# Patient Record
Sex: Female | Born: 1950 | Race: Black or African American | Hispanic: No | Marital: Married | Smoking: Former smoker
Health system: Southern US, Community
[De-identification: ages and names within clinical notes are randomized; demographics above are authoritative.]

## PROBLEM LIST (undated history)

## (undated) DIAGNOSIS — K219 Gastro-esophageal reflux disease without esophagitis: Secondary | ICD-10-CM

## (undated) DIAGNOSIS — I10 Essential (primary) hypertension: Secondary | ICD-10-CM

## (undated) DIAGNOSIS — IMO0002 Reserved for concepts with insufficient information to code with codable children: Secondary | ICD-10-CM

## (undated) DIAGNOSIS — E785 Hyperlipidemia, unspecified: Secondary | ICD-10-CM

## (undated) DIAGNOSIS — I2699 Other pulmonary embolism without acute cor pulmonale: Secondary | ICD-10-CM

## (undated) DIAGNOSIS — I82409 Acute embolism and thrombosis of unspecified deep veins of unspecified lower extremity: Secondary | ICD-10-CM

## (undated) DIAGNOSIS — M1990 Unspecified osteoarthritis, unspecified site: Secondary | ICD-10-CM

## (undated) DIAGNOSIS — D689 Coagulation defect, unspecified: Secondary | ICD-10-CM

## (undated) HISTORY — PX: CYSTECTOMY: SUR359

## (undated) HISTORY — DX: Gastro-esophageal reflux disease without esophagitis: K21.9

## (undated) HISTORY — PX: TUBAL LIGATION: SHX77

## (undated) HISTORY — DX: Coagulation defect, unspecified: D68.9

## (undated) HISTORY — DX: Hyperlipidemia, unspecified: E78.5

## (undated) HISTORY — PX: SKIN GRAFT: SHX250

## (undated) HISTORY — DX: Other pulmonary embolism without acute cor pulmonale: I26.99

## (undated) HISTORY — DX: Reserved for concepts with insufficient information to code with codable children: IMO0002

## (undated) HISTORY — DX: Acute embolism and thrombosis of unspecified deep veins of unspecified lower extremity: I82.409

## (undated) HISTORY — PX: KNEE ARTHROSCOPY: SUR90

---

## 1989-04-15 DIAGNOSIS — I82409 Acute embolism and thrombosis of unspecified deep veins of unspecified lower extremity: Secondary | ICD-10-CM

## 1989-04-15 DIAGNOSIS — I2699 Other pulmonary embolism without acute cor pulmonale: Secondary | ICD-10-CM

## 1989-04-15 HISTORY — DX: Acute embolism and thrombosis of unspecified deep veins of unspecified lower extremity: I82.409

## 1989-04-15 HISTORY — DX: Other pulmonary embolism without acute cor pulmonale: I26.99

## 1999-05-11 LAB — PROTIME-INR

## 2010-04-07 ENCOUNTER — Encounter: Payer: Self-pay | Admitting: Cardiology

## 2010-05-12 ENCOUNTER — Ambulatory Visit: Payer: Self-pay | Admitting: Cardiology

## 2010-05-12 DIAGNOSIS — E785 Hyperlipidemia, unspecified: Secondary | ICD-10-CM

## 2010-05-12 DIAGNOSIS — Z6835 Body mass index (BMI) 35.0-35.9, adult: Secondary | ICD-10-CM

## 2010-05-12 DIAGNOSIS — I10 Essential (primary) hypertension: Secondary | ICD-10-CM | POA: Insufficient documentation

## 2010-05-12 DIAGNOSIS — R9431 Abnormal electrocardiogram [ECG] [EKG]: Secondary | ICD-10-CM

## 2010-05-17 ENCOUNTER — Telehealth: Payer: Self-pay | Admitting: Cardiology

## 2010-09-14 NOTE — Assessment & Plan Note (Signed)
Summary: Christina [name]   Visit Type:  Initial Consult Primary Provider:  Paulene Floor, NP  CC:  Abn ETT.  History of Present Illness: The patient presents for evaluation of an abnormal treadmill test. She has no prior cardiac history. However, for the last several months she noticed symptoms such as dyspnea climbing a flight of stairs or increased shortness of breath with a row begin activity. She was having occasional rapid heart rates. She is not describing irregular heart rates and was not having any presyncope or syncope. She was not having any resting shortness of breath and wasn't describing PND or orthopnea. She did not describe chest pressure, neck or arm discomfort. She has been treated recently for hypertension which has been somewhat difficult to control.  She has also had significant dyslipidemia with an LDL of 181.  She did wear a monitor for 3 weeks which demonstrated no arrhythmias. She had an exercise treadmill test. She was able to exercise for 7 minutes and 27 seconds. There was an accelerated blood pressure response with a peak of 228/100. She did get some chest tightness and there was some anterolateral ST depression at peak exercise of about 2 mm. She is referred for further evaluation. She does say that she otherwise does not get this discomfort with activities. She doesn't describe jaw or arm discomfort.  Current Medications (verified): 1)  Lipitor 20 Mg Tabs (Atorvastatin Calcium) .Marland Kitchen.. 1 By Mouth Daily 2)  Diovan 320 Mg Tabs (Valsartan) .Marland Kitchen.. 1 By Mouth Daily 3)  Coumadin 10 Mg Tabs (Warfarin Sodium) .... As Directed 4)  Aspirin 81 Mg  Tabs (Aspirin) .Marland Kitchen.. 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 No clotting disorder  Social History: Married Five children Quit tobacco x 33 years  Review of Systems       As stated in the HPI and negative for all other systems.   Vital Signs:  Patient profile:   60 year old female Height:      69 inches Weight:      232 pounds BMI:     34.38 Pulse rate:   86 / minute Resp:     18 per minute BP sitting:   146 / 92  (right arm)  Vitals Entered By: Marrion Coy, CNA (May 12, 2010 2:38 PM)  Physical Exam  General:  Well developed, well nourished, in no acute distress. Head:  normocephalic and atraumatic Eyes:  PERRLA/EOM intact; conjunctiva and lids normal. Mouth:  Teeth, gums and palate normal. Oral mucosa normal. Neck:  Neck supple, no JVD. No masses, thyromegaly or abnormal cervical nodes. Chest Wall:  no deformities or breast masses noted Lungs:  Clear bilaterally to auscultation and percussion. Abdomen:  Bowel sounds positive; abdomen soft and non-tender without masses, organomegaly, or hernias noted. No hepatosplenomegaly, obese Msk:  Back normal, normal gait. Muscle strength and tone normal. Extremities:  No clubbing or cyanosis. Neurologic:  Alert and oriented x 3. Skin:  Intact without lesions or rashes. Cervical Nodes:  no significant adenopathy Axillary Nodes:  no significant adenopathy Psych:  Normal affect.   Detailed Cardiovascular Exam  Neck    Carotids: Carotids full and equal bilaterally without bruits.      Neck Veins: Normal, no JVD.  Heart    Inspection: no deformities or lifts noted.      Palpation: normal PMI with no thrills palpable.      Auscultation: regular rate and rhythm, S1, S2 without murmurs, rubs, gallops, or clicks.    Vascular    Abdominal Aorta: no palpable masses, pulsations, or audible bruits.      Femoral Pulses: normal femoral pulses bilaterally.      Pedal Pulses: normal pedal pulses bilaterally.      Radial Pulses: normal radial pulses bilaterally.       Peripheral Circulation: no clubbing, cyanosis, or edema noted with normal capillary refill.     EKG  Procedure date:  04/07/2010  Findings:      sinus rhythm, rate 78, axis within normal limits, intervals within normal limits, no acute ST-T wave changes  Impression & Recommendations:  Problem # 1:  ABNORMAL STRESS ELECTROCARDIOGRAM (ICD-794.31) The patient does have an abnormal treadmill test as described with some symptoms. The ST changes may have been related to the hypertensive blood pressure response. I don't think catheterization is indicated but I wouldn't pursue stress perfusion imaging to further clarify. She needs primary risk reduction as well. Orders: Nuclear Stress Test (Nuc Stress Test)  Problem # 2:  ESSENTIAL HYPERTENSION, BENIGN (ICD-401.1) She has just been started on the current dose of medication and may need further additions. I've asked her to buy a blood pressure cuff and instructed her on the diary. She needs therapeutic lifestyle changes to include salt restriction which she doesn't do and weight loss.  Problem # 3:  DYSLIPIDEMIA (ICD-272.4) She was recently started on Lipitor. I will defer to her primary physician. She understands the need for dietary control.  Problem # 4:  OVERWEIGHT (ICD-278.02) We discussed the need for weight loss with diet and exercise.  Patient Instructions: 1)  Your physician recommends that you schedule a follow-up appointment in: 6 months with Dr Antoine Poche 2)  Your physician recommends that you continue on your current medications as directed. Please refer to the Current Medication list given to you today. 3)  Your physician has requested that you have an exercise stress myoview.  For further information please visit [email or web address]  Please follow instruction sheet, as given.

## 2010-09-14 NOTE — Progress Notes (Signed)
Summary: pt didn't receive any insurance  Phone Note Call from Patient Call back at [facility] Phone [phone]   Caller: Patient Reason for Call: Talk to Nurse Summary of Call: per pt calling, pt wanted pam to know she didn't receive any insurance.  Initial call taken by: Lorne Skeens,  May 17, 2010 11:40 AM  Follow-up for Phone Call        Wife had applied for [employer] and was denied.  She said she will apply online and the earliest it would be approved would be sometime in November.  She will call Pam back when she is approved  in order to schedule her stress test.  I will forward this to Pam. Mylo Red RN

## 2010-12-30 ENCOUNTER — Encounter: Payer: Self-pay | Admitting: Family Medicine

## 2011-03-22 ENCOUNTER — Encounter: Payer: Self-pay | Admitting: Cardiology

## 2012-07-04 ENCOUNTER — Ambulatory Visit: Payer: Self-pay | Admitting: Physician Assistant

## 2012-11-01 ENCOUNTER — Ambulatory Visit (INDEPENDENT_AMBULATORY_CARE_PROVIDER_SITE_OTHER): Payer: BC Managed Care – PPO | Admitting: Pharmacist

## 2012-11-01 ENCOUNTER — Other Ambulatory Visit: Payer: Self-pay | Admitting: Pharmacist

## 2012-11-01 VITALS — BP 110/70 | HR 72

## 2012-11-01 DIAGNOSIS — I82409 Acute embolism and thrombosis of unspecified deep veins of unspecified lower extremity: Secondary | ICD-10-CM

## 2012-11-01 DIAGNOSIS — Z86718 Personal history of other venous thrombosis and embolism: Secondary | ICD-10-CM | POA: Insufficient documentation

## 2012-11-01 LAB — POCT INR: INR: 1.9

## 2012-11-12 ENCOUNTER — Other Ambulatory Visit: Payer: Self-pay | Admitting: *Deleted

## 2012-11-12 MED ORDER — ATORVASTATIN CALCIUM 20 MG PO TABS: ORAL_TABLET | ORAL | Status: DC

## 2012-11-12 NOTE — Telephone Encounter (Signed)
Pt aware rx for lipitor  Ready for pt here.

## 2012-11-14 ENCOUNTER — Other Ambulatory Visit: Payer: Self-pay | Admitting: *Deleted

## 2012-11-14 MED ORDER — ATORVASTATIN CALCIUM 20 MG PO TABS: ORAL_TABLET | ORAL | Status: DC

## 2012-11-20 ENCOUNTER — Telehealth: Payer: Self-pay | Admitting: Nurse Practitioner

## 2012-11-21 NOTE — Telephone Encounter (Signed)
Health nurse is assigned to patient through insurance.  Just wanted let me know.

## 2012-11-26 ENCOUNTER — Telehealth: Payer: Self-pay | Admitting: Nurse Practitioner

## 2012-11-26 NOTE — Telephone Encounter (Signed)
I will have to see paper. Did we do DMV form on her?

## 2012-11-26 NOTE — Telephone Encounter (Signed)
Has restrictions on her license now and she wants to know what is going on she went to the [facility] they told her to come here. She is very upset afraid she will lose her license and then her job please advise

## 2012-11-27 NOTE — Telephone Encounter (Signed)
Patient aware.

## 2012-11-29 ENCOUNTER — Telehealth: Payer: Self-pay | Admitting: Nurse Practitioner

## 2012-11-29 NOTE — Telephone Encounter (Signed)
MMM HAS- WILL READ THEN WE CAN CALL PT BACK

## 2012-12-03 ENCOUNTER — Encounter: Payer: Self-pay | Admitting: Nurse Practitioner

## 2012-12-03 NOTE — Telephone Encounter (Signed)
Let patient know letter is ready for pick up

## 2012-12-03 NOTE — Telephone Encounter (Signed)
NEEDS A LETTER WROTE TO THE MEDICAL BOARD ABOUT HER BLOOD PRESSURE FOR THE DMV

## 2012-12-04 ENCOUNTER — Telehealth: Payer: Self-pay | Admitting: Nurse Practitioner

## 2012-12-04 NOTE — Telephone Encounter (Signed)
Pt aware.

## 2012-12-04 NOTE — Telephone Encounter (Signed)
Pt aware needs a list from paper chart she hasn't been seen in [facility] link.

## 2012-12-13 ENCOUNTER — Encounter: Payer: Self-pay | Admitting: Pharmacist

## 2012-12-13 ENCOUNTER — Ambulatory Visit (INDEPENDENT_AMBULATORY_CARE_PROVIDER_SITE_OTHER): Payer: BC Managed Care – PPO | Admitting: Pharmacist

## 2012-12-13 VITALS — BP 122/74 | HR 78

## 2012-12-13 DIAGNOSIS — I82409 Acute embolism and thrombosis of unspecified deep veins of unspecified lower extremity: Secondary | ICD-10-CM

## 2012-12-13 LAB — POCT INR: INR: 2.2

## 2012-12-13 NOTE — Patient Instructions (Signed)
Anticoagulation Dose Instructions as of 12/13/2012     Glynis Smiles Tue Wed Thu Fri Sat   New Dose 10 mg 5 mg 10 mg 5 mg 10 mg 5 mg 10 mg    Description       Continue same dose.  Recheck 4-6 weeks.      INR was 2.2 today

## 2013-01-26 ENCOUNTER — Other Ambulatory Visit: Payer: Self-pay | Admitting: Nurse Practitioner

## 2013-02-01 ENCOUNTER — Ambulatory Visit (INDEPENDENT_AMBULATORY_CARE_PROVIDER_SITE_OTHER): Payer: BC Managed Care – PPO | Admitting: Nurse Practitioner

## 2013-02-01 ENCOUNTER — Encounter: Payer: Self-pay | Admitting: Nurse Practitioner

## 2013-02-01 VITALS — BP 140/81 | HR 79 | Temp 98.3°F | Ht 68.0 in | Wt 224.0 lb

## 2013-02-01 DIAGNOSIS — I82409 Acute embolism and thrombosis of unspecified deep veins of unspecified lower extremity: Secondary | ICD-10-CM

## 2013-02-01 DIAGNOSIS — I82403 Acute embolism and thrombosis of unspecified deep veins of lower extremity, bilateral: Secondary | ICD-10-CM

## 2013-02-01 DIAGNOSIS — E785 Hyperlipidemia, unspecified: Secondary | ICD-10-CM

## 2013-02-01 DIAGNOSIS — I10 Essential (primary) hypertension: Secondary | ICD-10-CM

## 2013-02-01 LAB — COMPLETE METABOLIC PANEL WITH GFR
CO2: 26 mEq/L (ref 19–32)
Calcium: 9.8 mg/dL (ref 8.4–10.5)
Creat: 1.09 mg/dL (ref 0.50–1.10)
GFR, Est African American: 62 mL/min
GFR, Est Non African American: 54 mL/min — ABNORMAL LOW
Glucose, Bld: 79 mg/dL (ref 70–99)
Total Bilirubin: 0.3 mg/dL (ref 0.3–1.2)

## 2013-02-01 MED ORDER — ATORVASTATIN CALCIUM 20 MG PO TABS: 20.0000 mg | ORAL_TABLET | Freq: Every day | ORAL | Status: DC

## 2013-02-01 MED ORDER — WARFARIN SODIUM 10 MG PO TABS: 10.0000 mg | ORAL_TABLET | Freq: Every day | ORAL | Status: DC

## 2013-02-01 MED ORDER — OLMESARTAN-AMLODIPINE-HCTZ 40-5-12.5 MG PO TABS: 1.0000 | ORAL_TABLET | Freq: Every day | ORAL | Status: DC

## 2013-02-01 NOTE — Patient Instructions (Signed)

## 2013-02-01 NOTE — Progress Notes (Signed)
  Subjective:    Patient ID: Christina [name], female    DOB: Feb 08, 1951, 62 y.o.   MRN: [id number]  Hypertension This is a chronic problem. The current episode started more than 1 year ago. The problem is controlled. Pertinent negatives include no blurred vision, headaches, malaise/fatigue, peripheral edema or shortness of breath. There are no associated agents to hypertension. Risk factors for coronary artery disease include dyslipidemia, obesity and post-menopausal state. Past treatments include angiotensin blockers, calcium channel blockers and diuretics. The current treatment provides moderate improvement. Compliance problems include diet and exercise.   Hyperlipidemia This is a chronic problem. The current episode started more than 1 year ago. The problem is controlled. Recent lipid tests were reviewed and are normal. There are no known factors aggravating her hyperlipidemia. Pertinent negatives include no focal sensory loss, focal weakness, leg pain, myalgias or shortness of breath. The current treatment provides moderate improvement of lipids. Compliance problems include adherence to diet and adherence to exercise.   Hx DVT Currently on coumadin therapy- doing well- no bleeding   Review of Systems  Constitutional: Negative for malaise/fatigue.  Eyes: Negative for blurred vision.  Respiratory: Negative for shortness of breath.   Musculoskeletal: Negative for myalgias.  Neurological: Negative for focal weakness and headaches.  All other systems reviewed and are negative.       Objective:   Physical Exam  Constitutional: She is oriented to person, place, and time. She appears well-developed and well-nourished.  HENT:  Nose: Nose normal.  Mouth/Throat: Oropharynx is clear and moist.  Eyes: EOM are normal.  Neck: Trachea normal, normal range of motion and full passive range of motion without pain. Neck supple. No JVD present. Carotid bruit is not present. No thyromegaly present.   Cardiovascular: Normal rate, regular rhythm, normal heart sounds and intact distal pulses.  Exam reveals no gallop and no friction rub.   No murmur heard. Pulmonary/Chest: Effort normal and breath sounds normal.  Abdominal: Soft. Bowel sounds are normal. She exhibits no distension and no mass. There is no tenderness.  Musculoskeletal: Normal range of motion.  Lymphadenopathy:    She has no cervical adenopathy.  Neurological: She is alert and oriented to person, place, and time. She has normal reflexes.  Skin: Skin is warm and dry.  Psychiatric: She has a normal mood and affect. Her behavior is normal. Judgment and thought content normal.    BP 140/81  Pulse 79  Temp(Src) 98.3 F (36.8 C) (Oral)  Ht 5\' 8"  (1.727 m)  Wt 224 lb (101.606 kg)  BMI 34.07 kg/m2       Assessment & Plan:  1. Hypertension Low Na +diet - atorvastatin (LIPITOR) 20 MG tablet; Take 1 tablet (20 mg total) by mouth daily.  Dispense: 30 tablet; Refill: 5  2. Hyperlipidemia Low fat diet and exercise - Olmesartan-Amlodipine-HCTZ (TRIBENZOR) 40-5-12.5 MG TABS; Take 1 tablet by mouth daily.  Dispense: 30 tablet; Refill: 5  3. DVT (deep venous thrombosis), bilateral Watch for bleeding - warfarin (COUMADIN) 10 MG tablet; Take 1 tablet (10 mg total) by mouth daily. Use daily as directed  Dispense: 45 tablet; Refill: 5  Mary-Margaret Daphine Deutscher, FNP

## 2013-02-04 ENCOUNTER — Other Ambulatory Visit: Payer: Self-pay | Admitting: Nurse Practitioner

## 2013-02-04 LAB — NMR LIPOPROFILE WITH LIPIDS
HDL Size: 9.2 nm (ref >=9.2)
HDL-C: 54 mg/dL (ref >=40)
LDL (calc): 158 mg/dL — ABNORMAL HIGH (ref <100)
LDL Size: 20.8 nm (ref >20.5)
LP-IR Score: 40 (ref <=45)
Large HDL-P: 8 umol/L (ref >=4.8)
Small LDL Particle Number: 1361 nmol/L — ABNORMAL HIGH (ref <=527)

## 2013-02-04 MED ORDER — ATORVASTATIN CALCIUM 40 MG PO TABS: 40.0000 mg | ORAL_TABLET | Freq: Every day | ORAL | Status: DC

## 2013-02-05 ENCOUNTER — Telehealth: Payer: Self-pay | Admitting: Nurse Practitioner

## 2013-02-05 NOTE — Telephone Encounter (Signed)
Pt aware ,new rx for cholesterol.

## 2013-02-14 ENCOUNTER — Encounter: Payer: Self-pay | Admitting: Pharmacist

## 2013-02-14 ENCOUNTER — Ambulatory Visit (INDEPENDENT_AMBULATORY_CARE_PROVIDER_SITE_OTHER): Payer: BC Managed Care – PPO | Admitting: Pharmacist

## 2013-02-14 VITALS — BP 116/78 | HR 77

## 2013-02-14 DIAGNOSIS — I82409 Acute embolism and thrombosis of unspecified deep veins of unspecified lower extremity: Secondary | ICD-10-CM

## 2013-02-14 LAB — POCT INR: INR: 2.1

## 2013-02-14 MED ORDER — DICLOFENAC SODIUM 1 % TD GEL: TRANSDERMAL | Status: DC

## 2013-02-14 NOTE — Patient Instructions (Signed)
Anticoagulation Dose Instructions as of 02/14/2013     Christina [name] Tue Wed Thu Fri Sat   New Dose 10 mg 5 mg 10 mg 5 mg 10 mg 5 mg 10 mg    Description       Continue same dose.  Recheck 4-6 weeks.      INR was 2.1 today

## 2013-04-01 ENCOUNTER — Ambulatory Visit (INDEPENDENT_AMBULATORY_CARE_PROVIDER_SITE_OTHER): Payer: BC Managed Care – PPO | Admitting: Pharmacist

## 2013-04-01 VITALS — BP 126/70

## 2013-04-01 DIAGNOSIS — I82409 Acute embolism and thrombosis of unspecified deep veins of unspecified lower extremity: Secondary | ICD-10-CM

## 2013-04-01 NOTE — Patient Instructions (Addendum)
Anticoagulation Dose Instructions as of 04/01/2013     Christina [name] Tue Wed Thu Fri Sat   New Dose 10 mg 5 mg 10 mg 5 mg 10 mg 5 mg 10 mg    Description       Take 1 tablet instead of 1/2 tablet today, then restart same dose.  Recheck 4-6 weeks.       INR was 1.8 today

## 2013-04-10 ENCOUNTER — Encounter: Payer: Self-pay | Admitting: *Deleted

## 2013-05-16 ENCOUNTER — Ambulatory Visit (INDEPENDENT_AMBULATORY_CARE_PROVIDER_SITE_OTHER): Payer: BC Managed Care – PPO | Admitting: Pharmacist

## 2013-05-16 VITALS — BP 114/78 | HR 77

## 2013-05-16 DIAGNOSIS — Z23 Encounter for immunization: Secondary | ICD-10-CM

## 2013-05-16 DIAGNOSIS — I82409 Acute embolism and thrombosis of unspecified deep veins of unspecified lower extremity: Secondary | ICD-10-CM

## 2013-05-16 NOTE — Patient Instructions (Signed)
Anticoagulation Dose Instructions as of 05/16/2013     Christina [name] Tue Wed Thu Fri Sat   New Dose 10 mg 5 mg 10 mg 5 mg 10 mg 5 mg 10 mg    Description       Continue 1/2 tablet Monday, wednesdays and fridays and 1 tablet all other days.  Recheck 4-6 weeks.       INR was 2.2 today.

## 2013-05-27 ENCOUNTER — Ambulatory Visit: Payer: Self-pay | Admitting: Nurse Practitioner

## 2013-05-29 ENCOUNTER — Ambulatory Visit: Payer: Self-pay | Admitting: Nurse Practitioner

## 2013-06-18 ENCOUNTER — Encounter: Payer: Self-pay | Admitting: Nurse Practitioner

## 2013-06-18 ENCOUNTER — Ambulatory Visit (INDEPENDENT_AMBULATORY_CARE_PROVIDER_SITE_OTHER): Payer: BC Managed Care – PPO | Admitting: Nurse Practitioner

## 2013-06-18 VITALS — BP 133/77 | HR 78 | Temp 98.7°F | Ht 68.0 in | Wt 224.0 lb

## 2013-06-18 DIAGNOSIS — I82403 Acute embolism and thrombosis of unspecified deep veins of lower extremity, bilateral: Secondary | ICD-10-CM

## 2013-06-18 DIAGNOSIS — E785 Hyperlipidemia, unspecified: Secondary | ICD-10-CM

## 2013-06-18 DIAGNOSIS — I10 Essential (primary) hypertension: Secondary | ICD-10-CM

## 2013-06-18 DIAGNOSIS — I82409 Acute embolism and thrombosis of unspecified deep veins of unspecified lower extremity: Secondary | ICD-10-CM

## 2013-06-18 DIAGNOSIS — E559 Vitamin D deficiency, unspecified: Secondary | ICD-10-CM

## 2013-06-18 LAB — POCT INR: INR: 2

## 2013-06-18 MED ORDER — OLMESARTAN-AMLODIPINE-HCTZ 40-5-12.5 MG PO TABS: 1.0000 | ORAL_TABLET | Freq: Every day | ORAL | Status: DC

## 2013-06-18 MED ORDER — WARFARIN SODIUM 10 MG PO TABS: 10.0000 mg | ORAL_TABLET | Freq: Every day | ORAL | Status: DC

## 2013-06-18 MED ORDER — ATORVASTATIN CALCIUM 40 MG PO TABS: 40.0000 mg | ORAL_TABLET | Freq: Every day | ORAL | Status: DC

## 2013-06-18 NOTE — Patient Instructions (Addendum)

## 2013-06-18 NOTE — Progress Notes (Signed)
  Subjective:    Patient ID: Christina [name], female    DOB: 03-25-1951, 62 y.o.   MRN: [id number]  Hypertension This is a chronic problem. The current episode started more than 1 year ago. The problem is controlled. Pertinent negatives include no blurred vision, headaches, malaise/fatigue, peripheral edema or shortness of breath. There are no associated agents to hypertension. Risk factors for coronary artery disease include dyslipidemia, obesity and post-menopausal state. Past treatments include angiotensin blockers, calcium channel blockers and diuretics. The current treatment provides moderate improvement. Compliance problems include diet and exercise.   Hyperlipidemia This is a chronic problem. The current episode started more than 1 year ago. The problem is controlled. Recent lipid tests were reviewed and are normal. There are no known factors aggravating her hyperlipidemia. Pertinent negatives include no focal sensory loss, focal weakness, leg pain, myalgias or shortness of breath. The current treatment provides moderate improvement of lipids. Compliance problems include adherence to diet and adherence to exercise.   Hx DVT Currently on coumadin therapy- doing well- no bleeding   Review of Systems  Constitutional: Negative for malaise/fatigue.  Eyes: Negative for blurred vision.  Respiratory: Negative for shortness of breath.   Musculoskeletal: Negative for myalgias.  Neurological: Negative for focal weakness and headaches.  All other systems reviewed and are negative.       Objective:   Physical Exam  Constitutional: She is oriented to person, place, and time. She appears well-developed and well-nourished.  HENT:  Nose: Nose normal.  Mouth/Throat: Oropharynx is clear and moist.  Eyes: EOM are normal.  Neck: Trachea normal, normal range of motion and full passive range of motion without pain. Neck supple. No JVD present. Carotid bruit is not present. No thyromegaly present.   Cardiovascular: Normal rate, regular rhythm, normal heart sounds and intact distal pulses.  Exam reveals no gallop and no friction rub.   No murmur heard. Pulmonary/Chest: Effort normal and breath sounds normal.  Abdominal: Soft. Bowel sounds are normal. She exhibits no distension and no mass. There is no tenderness.  Musculoskeletal: Normal range of motion.  Lymphadenopathy:    She has no cervical adenopathy.  Neurological: She is alert and oriented to person, place, and time. She has normal reflexes.  Skin: Skin is warm and dry.  Psychiatric: She has a normal mood and affect. Her behavior is normal. Judgment and thought content normal.    BP 133/77  Pulse 78  Temp(Src) 98.7 F (37.1 C) (Oral)  Ht 5\' 8"  (1.727 m)  Wt 224 lb (101.606 kg)  BMI 34.07 kg/m2       Assessment & Plan:  1. Hypertension Low Na +diet - atorvastatin (LIPITOR) 20 MG tablet; Take 1 tablet (20 mg total) by mouth daily.  Dispense: 30 tablet; Refill: 5  2. Hyperlipidemia Low fat diet and exercise - Olmesartan-Amlodipine-HCTZ (TRIBENZOR) 40-5-12.5 MG TABS; Take 1 tablet by mouth daily.  Dispense: 30 tablet; Refill: 5  3. DVT (deep venous thrombosis), bilateral Watch for bleeding - warfarin (COUMADIN) 10 MG tablet; Take 1 tablet (10 mg total) by mouth daily. Use daily as directed  Dispense: 45 tablet; Refill: 5  Mary-Margaret Daphine Deutscher, FNP

## 2013-06-20 LAB — CMP14+EGFR
Albumin/Globulin Ratio: 1.6 (ref 1.1–2.5)
Albumin: 4.4 g/dL (ref 3.6–4.8)
Alkaline Phosphatase: 98 IU/L (ref 39–117)
BUN/Creatinine Ratio: 17 (ref 11–26)
BUN: 17 mg/dL (ref 8–27)
Chloride: 102 mmol/L (ref 97–108)
GFR calc Af Amer: 71 mL/min/{1.73_m2} (ref >59)
GFR calc non Af Amer: 61 mL/min/{1.73_m2} (ref >59)
Globulin, Total: 2.7 g/dL (ref 1.5–4.5)
Total Bilirubin: 0.4 mg/dL (ref 0.0–1.2)
Total Protein: 7.1 g/dL (ref 6.0–8.5)

## 2013-06-20 LAB — NMR, LIPOPROFILE
Cholesterol: 175 mg/dL (ref <200)
HDL Particle Number: 34.4 umol/L (ref >=30.5)
LDL Particle Number: 1388 nmol/L — ABNORMAL HIGH (ref <1000)
LDL Size: 21 nm (ref >20.5)
LP-IR Score: 29 (ref <=45)
Small LDL Particle Number: 455 nmol/L (ref <=527)
Triglycerides by NMR: 119 mg/dL (ref <150)

## 2013-06-20 LAB — VITAMIN D 25 HYDROXY (VIT D DEFICIENCY, FRACTURES): Vit D, 25-Hydroxy: 37.3 ng/mL (ref 30.0–100.0)

## 2013-07-08 ENCOUNTER — Other Ambulatory Visit: Payer: Self-pay | Admitting: Nurse Practitioner

## 2013-08-01 ENCOUNTER — Ambulatory Visit (INDEPENDENT_AMBULATORY_CARE_PROVIDER_SITE_OTHER): Payer: BC Managed Care – PPO | Admitting: Pharmacist

## 2013-08-01 DIAGNOSIS — I82409 Acute embolism and thrombosis of unspecified deep veins of unspecified lower extremity: Secondary | ICD-10-CM

## 2013-08-01 LAB — POCT INR: INR: 1.7

## 2013-08-01 NOTE — Patient Instructions (Signed)
Anticoagulation Dose Instructions as of 08/01/2013     Christina [name] Tue Wed Thu Fri Sat   New Dose 10 mg 5 mg 10 mg 5 mg 10 mg 5 mg 10 mg    Description       Take 1 and 1/2 tablets today (08/01/2013) and tomorrow (08/02/13)  Continue 1/2 tablet Monday, wednesdays and fridays and 1 tablet all other days.  Recheck 4-6 weeks.      INR was 1.7 today

## 2013-08-12 ENCOUNTER — Other Ambulatory Visit: Payer: Self-pay | Admitting: Nurse Practitioner

## 2013-08-15 HISTORY — PX: COLONOSCOPY: SHX174

## 2013-08-26 ENCOUNTER — Telehealth: Payer: Self-pay | Admitting: Nurse Practitioner

## 2013-08-26 DIAGNOSIS — E785 Hyperlipidemia, unspecified: Secondary | ICD-10-CM

## 2013-08-26 MED ORDER — OLMESARTAN-AMLODIPINE-HCTZ 40-5-12.5 MG PO TABS: 1.0000 | ORAL_TABLET | Freq: Every day | ORAL | Status: DC

## 2013-08-26 NOTE — Telephone Encounter (Signed)
rx sent to pharmacy- [facility] 

## 2013-09-12 ENCOUNTER — Encounter: Payer: Self-pay | Admitting: Pharmacist

## 2013-09-12 ENCOUNTER — Ambulatory Visit (INDEPENDENT_AMBULATORY_CARE_PROVIDER_SITE_OTHER): Payer: BC Managed Care – PPO | Admitting: Pharmacist

## 2013-09-12 DIAGNOSIS — I82409 Acute embolism and thrombosis of unspecified deep veins of unspecified lower extremity: Secondary | ICD-10-CM

## 2013-09-12 LAB — POCT INR: INR: 2.1

## 2013-09-12 NOTE — Patient Instructions (Signed)
Anticoagulation Dose Instructions as of 09/12/2013     Glynis SmilesSun Mon Tue Wed Thu Fri Sat   New Dose 10 mg 5 mg 10 mg 5 mg 10 mg 5 mg 10 mg    Description       Continue 1/2 tablet Monday, wednesdays and fridays and 1 tablet all other days.  Recheck 4-6 weeks.      INR was 2.1 today

## 2013-09-20 ENCOUNTER — Telehealth: Payer: Self-pay | Admitting: Nurse Practitioner

## 2013-10-01 NOTE — Telephone Encounter (Signed)
Patient states her foot is improving and if it doesn't improve than she will give us a call back

## 2013-10-23 ENCOUNTER — Telehealth: Payer: Self-pay | Admitting: Pharmacist

## 2013-10-23 NOTE — Telephone Encounter (Signed)
Left #21 sample of Tribenzor at front desk .  LM for patient

## 2013-10-25 ENCOUNTER — Ambulatory Visit (INDEPENDENT_AMBULATORY_CARE_PROVIDER_SITE_OTHER): Payer: BC Managed Care – PPO | Admitting: Nurse Practitioner

## 2013-10-25 ENCOUNTER — Encounter: Payer: Self-pay | Admitting: Nurse Practitioner

## 2013-10-25 VITALS — BP 123/80 | HR 76 | Temp 97.1°F | Ht 68.0 in | Wt 229.0 lb

## 2013-10-25 DIAGNOSIS — R635 Abnormal weight gain: Secondary | ICD-10-CM

## 2013-10-25 DIAGNOSIS — E785 Hyperlipidemia, unspecified: Secondary | ICD-10-CM

## 2013-10-25 DIAGNOSIS — E663 Overweight: Secondary | ICD-10-CM

## 2013-10-25 DIAGNOSIS — I82409 Acute embolism and thrombosis of unspecified deep veins of unspecified lower extremity: Secondary | ICD-10-CM

## 2013-10-25 DIAGNOSIS — I10 Essential (primary) hypertension: Secondary | ICD-10-CM

## 2013-10-25 LAB — POCT INR: INR: 2.7

## 2013-10-25 MED ORDER — WARFARIN SODIUM 10 MG PO TABS: ORAL_TABLET | ORAL | Status: DC

## 2013-10-25 MED ORDER — ATORVASTATIN CALCIUM 40 MG PO TABS: 40.0000 mg | ORAL_TABLET | Freq: Every day | ORAL | Status: DC

## 2013-10-25 MED ORDER — OLMESARTAN-AMLODIPINE-HCTZ 40-5-12.5 MG PO TABS: 1.0000 | ORAL_TABLET | Freq: Every day | ORAL | Status: DC

## 2013-10-25 NOTE — Patient Instructions (Signed)
Anticoagulation Dose Instructions as of 10/25/2013     Christina SmilesSun Mon Tue Wed Thu Fri Sat   New Dose 10 mg 5 mg 10 mg 5 mg 10 mg 5 mg 10 mg    Description       Continue 1/2 tablet Monday, wednesdays and fridays and 1 tablet all other days.  Recheck 4-6 weeks.

## 2013-10-25 NOTE — Progress Notes (Signed)
Subjective:    Patient ID: Christina [name], female    DOB: 30-Jun-1951, 63 y.o.   MRN: [id number]  Patient in today for follow up of chronic medical problems. No complaints today.  Hypertension This is a chronic problem. The current episode started more than 1 year ago. The problem is controlled. Pertinent negatives include no blurred vision, headaches, malaise/fatigue, peripheral edema or shortness of breath. There are no associated agents to hypertension. Risk factors for coronary artery disease include dyslipidemia, obesity and post-menopausal state. Past treatments include angiotensin blockers, calcium channel blockers and diuretics. The current treatment provides moderate improvement. Compliance problems include diet and exercise.   Hyperlipidemia This is a chronic problem. The current episode started more than 1 year ago. The problem is controlled. Recent lipid tests were reviewed and are normal. There are no known factors aggravating her hyperlipidemia. Pertinent negatives include no focal sensory loss, focal weakness, leg pain, myalgias or shortness of breath. The current treatment provides moderate improvement of lipids. Compliance problems include adherence to diet and adherence to exercise.   Hx DVT Currently on coumadin therapy- doing well- no bleeding   Review of Systems  Constitutional: Negative for malaise/fatigue.  Eyes: Negative for blurred vision.  Respiratory: Negative for shortness of breath.   Musculoskeletal: Negative for myalgias.  Neurological: Negative for focal weakness and headaches.  All other systems reviewed and are negative.       Objective:   Physical Exam  Constitutional: She is oriented to person, place, and time. She appears well-developed and well-nourished.  HENT:  Nose: Nose normal.  Mouth/Throat: Oropharynx is clear and moist.  Eyes: EOM are normal.  Neck: Trachea normal, normal range of motion and full passive range of motion without pain. Neck  supple. No JVD present. Carotid bruit is not present. No thyromegaly present.  Cardiovascular: Normal rate, regular rhythm, normal heart sounds and intact distal pulses.  Exam reveals no gallop and no friction rub.   No murmur heard. Pulmonary/Chest: Effort normal and breath sounds normal.  Abdominal: Soft. Bowel sounds are normal. She exhibits no distension and no mass. There is no tenderness.  Musculoskeletal: Normal range of motion.  Lymphadenopathy:    She has no cervical adenopathy.  Neurological: She is alert and oriented to person, place, and time. She has normal reflexes.  Skin: Skin is warm and dry.  Psychiatric: She has a normal mood and affect. Her behavior is normal. Judgment and thought content normal.    BP 123/80  Pulse 76  Temp(Src) 97.1 F (36.2 C) (Oral)  Ht _0  (1.727 m)  Wt 229 lb (103.874 kg)  BMI 34.83 kg/m2       Assessment & Plan:   1. Overweight   2. Essential hypertension, benign   3. DYSLIPIDEMIA   4. DVT of leg (deep venous thrombosis)   5. Hyperlipidemia   6. DVT (deep venous thrombosis), unspecified laterality    Orders Placed This Encounter  Procedures  . CMP14+EGFR  . NMR, lipoprofile   Meds ordered this encounter  Medications  . atorvastatin (LIPITOR) 40 MG tablet    Sig: Take 1 tablet (40 mg total) by mouth daily.    Dispense:  30 tablet    Refill:  5    Order Specific Question:  Supervising Provider    Answer:  Chipper Herb [[id number]]  . Olmesartan-Amlodipine-HCTZ (TRIBENZOR) 40-5-12.5 MG TABS    Sig: Take 1 tablet by mouth daily.    Dispense:  30 tablet    Refill:  5    Order Specific Question:  Supervising Provider    Answer:  Chipper Herb [[id number]]  . warfarin (COUMADIN) 10 MG tablet    Sig: TAKE 1 TABLET BY MOUTH EVERY DAY    Dispense:  30 tablet    Refill:  5    Order Specific Question:  Supervising Provider    Answer:  Chipper Herb [[id number]]    Labs pending Health maintenance reviewed Diet and exercise  encouraged Continue all meds Follow up  In 3 month   [place], FNP

## 2013-10-27 LAB — CMP14+EGFR
ALK PHOS: 106 IU/L (ref 39–117)
ALT: 28 IU/L (ref 0–32)
AST: 28 IU/L (ref 0–40)
Albumin/Globulin Ratio: 1.5 (ref 1.1–2.5)
Albumin: 4.4 g/dL (ref 3.6–4.8)
BUN / CREAT RATIO: 30 — AB (ref 11–26)
BUN: 40 mg/dL — AB (ref 8–27)
CHLORIDE: 103 mmol/L (ref 97–108)
CO2: 25 mmol/L (ref 18–29)
Calcium: 9.7 mg/dL (ref 8.7–10.3)
Creatinine, Ser: 1.35 mg/dL — ABNORMAL HIGH (ref 0.57–1.00)
GFR calc Af Amer: 48 mL/min/{1.73_m2} — ABNORMAL LOW (ref >59)
GFR calc non Af Amer: 42 mL/min/{1.73_m2} — ABNORMAL LOW (ref >59)
Globulin, Total: 3 g/dL (ref 1.5–4.5)
Glucose: 86 mg/dL (ref 65–99)
Potassium: 5.3 mmol/L — ABNORMAL HIGH (ref 3.5–5.2)
Sodium: 143 mmol/L (ref 134–144)
Total Bilirubin: 0.2 mg/dL (ref 0.0–1.2)
Total Protein: 7.4 g/dL (ref 6.0–8.5)

## 2013-10-27 LAB — NMR, LIPOPROFILE
CHOLESTEROL: 174 mg/dL (ref <200)
HDL Cholesterol by NMR: 70 mg/dL (ref >=40)
HDL Particle Number: 40.2 umol/L (ref >=30.5)
LDL PARTICLE NUMBER: 630 nmol/L (ref <1000)
LDL Size: 21 nm (ref >20.5)
LDLC SERPL CALC-MCNC: 84 mg/dL (ref <100)
LP-IR SCORE: 31 (ref <=45)
Small LDL Particle Number: <90 nmol/L (ref <=527)
Triglycerides by NMR: 101 mg/dL (ref <150)

## 2013-10-30 ENCOUNTER — Telehealth: Payer: Self-pay | Admitting: Nurse Practitioner

## 2013-10-30 NOTE — Telephone Encounter (Signed)
Patient aware of labs.  

## 2013-11-25 ENCOUNTER — Ambulatory Visit (INDEPENDENT_AMBULATORY_CARE_PROVIDER_SITE_OTHER): Payer: BC Managed Care – PPO | Admitting: Pharmacist

## 2013-11-25 DIAGNOSIS — I82409 Acute embolism and thrombosis of unspecified deep veins of unspecified lower extremity: Secondary | ICD-10-CM

## 2013-11-25 DIAGNOSIS — E875 Hyperkalemia: Secondary | ICD-10-CM

## 2013-11-25 LAB — POCT INR: INR: 2.5

## 2013-11-25 NOTE — Progress Notes (Signed)
Last BMP showed slightly elevated potassium.  Per Christina [name]'s note wanted rechecking in 1 month.  BMP ordered today.

## 2013-11-25 NOTE — Patient Instructions (Signed)
Anticoagulation Dose Instructions as of 11/25/2013     Christina SmilesSun Mon Tue Wed Thu Fri Sat   New Dose 10 mg 5 mg 10 mg 5 mg 10 mg 5 mg 10 mg    Description       Continue 1/2 tablet Monday, wednesdays and fridays and 1 tablet all other days.  Recheck 4-6 weeks.      INR was 2.5 today

## 2013-11-26 LAB — BMP8+EGFR
BUN/Creatinine Ratio: 16 (ref 11–26)
BUN: 22 mg/dL (ref 8–27)
CALCIUM: 9.9 mg/dL (ref 8.7–10.3)
CO2: 22 mmol/L (ref 18–29)
Chloride: 103 mmol/L (ref 97–108)
Creatinine, Ser: 1.39 mg/dL — ABNORMAL HIGH (ref 0.57–1.00)
GFR calc non Af Amer: 40 mL/min/{1.73_m2} — ABNORMAL LOW (ref >59)
GFR, EST AFRICAN AMERICAN: 46 mL/min/{1.73_m2} — AB (ref >59)
GLUCOSE: 84 mg/dL (ref 65–99)
Potassium: 5.4 mmol/L — ABNORMAL HIGH (ref 3.5–5.2)
Sodium: 144 mmol/L (ref 134–144)

## 2014-01-09 ENCOUNTER — Encounter: Payer: Self-pay | Admitting: Pharmacist

## 2014-01-09 ENCOUNTER — Ambulatory Visit (INDEPENDENT_AMBULATORY_CARE_PROVIDER_SITE_OTHER): Payer: BC Managed Care – PPO | Admitting: Pharmacist

## 2014-01-09 VITALS — BP 124/70 | HR 77

## 2014-01-09 DIAGNOSIS — R7989 Other specified abnormal findings of blood chemistry: Secondary | ICD-10-CM

## 2014-01-09 DIAGNOSIS — R799 Abnormal finding of blood chemistry, unspecified: Secondary | ICD-10-CM

## 2014-01-09 DIAGNOSIS — I82409 Acute embolism and thrombosis of unspecified deep veins of unspecified lower extremity: Secondary | ICD-10-CM

## 2014-01-09 LAB — POCT INR: INR: 2

## 2014-01-09 NOTE — Patient Instructions (Signed)
Anticoagulation Dose Instructions as of 01/09/2014     Christina [name] Tue Wed Thu Fri Sat   New Dose 10 mg 5 mg 10 mg 5 mg 10 mg 5 mg 10 mg    Description       Continue 1/2 tablet Monday, wednesdays and fridays and 1 tablet all other days.  Recheck 4-6 weeks.      INR was 2.0 today

## 2014-01-10 LAB — BMP8+EGFR
BUN/Creatinine Ratio: 22 (ref 11–26)
BUN: 29 mg/dL — ABNORMAL HIGH (ref 8–27)
CHLORIDE: 104 mmol/L (ref 97–108)
CO2: 19 mmol/L (ref 18–29)
Calcium: 10 mg/dL (ref 8.7–10.3)
Creatinine, Ser: 1.3 mg/dL — ABNORMAL HIGH (ref 0.57–1.00)
GFR calc Af Amer: 50 mL/min/{1.73_m2} — ABNORMAL LOW (ref >59)
GFR calc non Af Amer: 43 mL/min/{1.73_m2} — ABNORMAL LOW (ref >59)
GLUCOSE: 89 mg/dL (ref 65–99)
Potassium: 4.6 mmol/L (ref 3.5–5.2)
Sodium: 144 mmol/L (ref 134–144)

## 2014-01-15 ENCOUNTER — Encounter: Payer: Self-pay | Admitting: Nurse Practitioner

## 2014-01-15 ENCOUNTER — Ambulatory Visit (INDEPENDENT_AMBULATORY_CARE_PROVIDER_SITE_OTHER): Payer: BC Managed Care – PPO | Admitting: Nurse Practitioner

## 2014-01-15 VITALS — BP 124/70 | HR 81 | Temp 98.0°F | Ht 68.0 in | Wt 234.0 lb

## 2014-01-15 DIAGNOSIS — Z86718 Personal history of other venous thrombosis and embolism: Secondary | ICD-10-CM

## 2014-01-15 DIAGNOSIS — Z0289 Encounter for other administrative examinations: Secondary | ICD-10-CM

## 2014-01-15 DIAGNOSIS — I82409 Acute embolism and thrombosis of unspecified deep veins of unspecified lower extremity: Secondary | ICD-10-CM

## 2014-01-15 LAB — PROTIME-INR: INR: 2.5 — AB (ref 0.9–1.1)

## 2014-01-15 LAB — POCT INR: INR: 2.5

## 2014-01-15 NOTE — Patient Instructions (Signed)
Anticoagulation Dose Instructions as of 01/15/2014     Glynis Smiles Tue Wed Thu Fri Sat   New Dose 10 mg 5 mg 10 mg 5 mg 10 mg 5 mg 10 mg    Description       Continue 1/2 tablet Monday, wednesdays and fridays and 1 tablet all other days.  Recheck 4-6 weeks.     Recheck in 4-6 weeks

## 2014-01-15 NOTE — Progress Notes (Signed)
   Subjective:    Patient ID: Christina [name], female    DOB: 05/28/1951, 63 y.o.   MRN: [id number]  HPI Patient in today to have DMV forms filled out- She is doing well without complaints    Review of Systems  All other systems reviewed and are negative.      Objective:   Physical Exam  No pHysical assessment performed      Assessment & Plan:   1. Other general medical examination for administrative purposes   2. History of DVT of lower extremity    INR checked today- continue current medication dose DMV form filled out  Mary-Margaret Daphine Deutscher, FNP

## 2014-01-27 ENCOUNTER — Ambulatory Visit: Payer: BC Managed Care – PPO | Admitting: Nurse Practitioner

## 2014-03-05 ENCOUNTER — Encounter: Payer: Self-pay | Admitting: Nurse Practitioner

## 2014-03-05 ENCOUNTER — Ambulatory Visit (INDEPENDENT_AMBULATORY_CARE_PROVIDER_SITE_OTHER): Payer: BC Managed Care – PPO | Admitting: Nurse Practitioner

## 2014-03-05 VITALS — BP 118/72 | HR 105 | Temp 98.5°F | Ht 68.0 in | Wt 233.0 lb

## 2014-03-05 DIAGNOSIS — I10 Essential (primary) hypertension: Secondary | ICD-10-CM

## 2014-03-05 DIAGNOSIS — I82409 Acute embolism and thrombosis of unspecified deep veins of unspecified lower extremity: Secondary | ICD-10-CM

## 2014-03-05 DIAGNOSIS — E785 Hyperlipidemia, unspecified: Secondary | ICD-10-CM

## 2014-03-05 DIAGNOSIS — Z6835 Body mass index (BMI) 35.0-35.9, adult: Secondary | ICD-10-CM

## 2014-03-05 DIAGNOSIS — Z86718 Personal history of other venous thrombosis and embolism: Secondary | ICD-10-CM

## 2014-03-05 DIAGNOSIS — Z7901 Long term (current) use of anticoagulants: Secondary | ICD-10-CM

## 2014-03-05 LAB — POCT INR: INR: 2.2

## 2014-03-05 MED ORDER — WARFARIN SODIUM 10 MG PO TABS: ORAL_TABLET | ORAL | Status: DC

## 2014-03-05 MED ORDER — OLMESARTAN-AMLODIPINE-HCTZ 40-5-12.5 MG PO TABS: 1.0000 | ORAL_TABLET | Freq: Every day | ORAL | Status: DC

## 2014-03-05 MED ORDER — ATORVASTATIN CALCIUM 40 MG PO TABS: 40.0000 mg | ORAL_TABLET | Freq: Every day | ORAL | Status: DC

## 2014-03-05 NOTE — Patient Instructions (Signed)
Anticoagulation Dose Instructions as of 03/05/2014     Glynis SmilesSun Mon Tue Wed Thu Fri Sat   New Dose 10 mg 5 mg 10 mg 5 mg 10 mg 5 mg 10 mg    Description       Continue 1/2 tablet Monday, wednesdays and fridays and 1 tablet all other days.  Recheck 4-6 weeks.

## 2014-03-05 NOTE — Progress Notes (Signed)
Subjective:    Patient ID: Christina [name], female    DOB: [birth year], 63 y.o.   MRN: [id number]  Patient in today for follow up of chronic medical problems. No complaints today. Patient is suppose to have colonoscopy and they want her to stop her coumadin and take lovenox injection for a few days.  Hypertension This is a chronic problem. The current episode started more than 1 year ago. The problem is controlled. Pertinent negatives include no blurred vision, headaches, malaise/fatigue, peripheral edema or shortness of breath. There are no associated agents to hypertension. Risk factors for coronary artery disease include dyslipidemia, obesity and post-menopausal state. Past treatments include angiotensin blockers, calcium channel blockers and diuretics. The current treatment provides moderate improvement. Compliance problems include diet and exercise.   Hyperlipidemia This is a chronic problem. The current episode started more than 1 year ago. The problem is controlled. Recent lipid tests were reviewed and are normal. There are no known factors aggravating her hyperlipidemia. Pertinent negatives include no focal sensory loss, focal weakness, leg pain, myalgias or shortness of breath. The current treatment provides moderate improvement of lipids. Compliance problems include adherence to diet and adherence to exercise.   Hx DVT Currently on coumadin therapy- doing well- no bleeding   Review of Systems  Constitutional: Negative for malaise/fatigue.  Eyes: Negative for blurred vision.  Respiratory: Negative for shortness of breath.   Musculoskeletal: Negative for myalgias.  Neurological: Negative for focal weakness and headaches.  All other systems reviewed and are negative.      Objective:   Physical Exam  Constitutional: She is oriented to person, place, and time. She appears well-developed and well-nourished.  HENT:  Nose: Nose normal.  Mouth/Throat: Oropharynx is clear and moist.  Eyes:  EOM are normal.  Neck: Trachea normal, normal range of motion and full passive range of motion without pain. Neck supple. No JVD present. Carotid bruit is not present. No thyromegaly present.  Cardiovascular: Normal rate, regular rhythm, normal heart sounds and intact distal pulses.  Exam reveals no gallop and no friction rub.   No murmur heard. Pulmonary/Chest: Effort normal and breath sounds normal.  Abdominal: Soft. Bowel sounds are normal. She exhibits no distension and no mass. There is no tenderness.  Musculoskeletal: Normal range of motion.  Lymphadenopathy:    She has no cervical adenopathy.  Neurological: She is alert and oriented to person, place, and time. She has normal reflexes.  Skin: Skin is warm and dry.  Psychiatric: She has a normal mood and affect. Her behavior is normal. Judgment and thought content normal.    BP 118/72  Pulse 105  Temp(Src) 98.5 F (36.9 C) (Oral)  Ht '5\' 8"'  (1.727 m)  Wt 233 lb (105.688 kg)  BMI 35.44 kg/m2  Results for orders placed in visit on 03/05/14  POCT INR      Result Value Ref Range   INR 2.2          Assessment & Plan:   1. Essential hypertension, benign   2. DYSLIPIDEMIA   3. BMI 35.0-35.9,adult   4. History of DVT of lower extremity   5. On anticoagulant therapy   6. Hyperlipidemia   7. DVT of leg (deep venous thrombosis)    Orders Placed This Encounter  Procedures  . CMP14+EGFR  . NMR, lipoprofile   Meds ordered this encounter  Medications  . warfarin (COUMADIN) 10 MG tablet    Sig: TAKE 1 TABLET BY MOUTH EVERY DAY    Dispense:  30  tablet    Refill:  5    Order Specific Question:  Supervising Provider    Answer:  Chipper Herb [[id number]]  . Olmesartan-Amlodipine-HCTZ (TRIBENZOR) 40-5-12.5 MG TABS    Sig: Take 1 tablet by mouth daily.    Dispense:  30 tablet    Refill:  5    Order Specific Question:  Supervising Provider    Answer:  Chipper Herb [[id number]]  . atorvastatin (LIPITOR) 40 MG tablet    Sig: Take 1  tablet (40 mg total) by mouth daily.    Dispense:  30 tablet    Refill:  5    Order Specific Question:  Supervising Provider    Answer:  Joycelyn Man   Okay to do lovenox during time of colonoscopy Labs pending Health maintenance reviewed Diet and exercise encouraged Continue all meds Follow up  In 3 months   [place], FNP

## 2014-03-06 ENCOUNTER — Telehealth: Payer: Self-pay | Admitting: Family Medicine

## 2014-03-06 ENCOUNTER — Encounter: Payer: Self-pay | Admitting: Nurse Practitioner

## 2014-03-06 ENCOUNTER — Telehealth: Payer: Self-pay | Admitting: Pharmacist

## 2014-03-06 LAB — CMP14+EGFR
ALT: 21 IU/L (ref 0–32)
AST: 25 IU/L (ref 0–40)
Albumin/Globulin Ratio: 1.7 (ref 1.1–2.5)
Albumin: 4.3 g/dL (ref 3.6–4.8)
Alkaline Phosphatase: 102 IU/L (ref 39–117)
BUN/Creatinine Ratio: 12 (ref 11–26)
BUN: 15 mg/dL (ref 8–27)
CALCIUM: 10 mg/dL (ref 8.7–10.3)
CHLORIDE: 104 mmol/L (ref 97–108)
CO2: 24 mmol/L (ref 18–29)
Creatinine, Ser: 1.26 mg/dL — ABNORMAL HIGH (ref 0.57–1.00)
GFR calc Af Amer: 52 mL/min/{1.73_m2} — ABNORMAL LOW (ref >59)
GFR calc non Af Amer: 45 mL/min/{1.73_m2} — ABNORMAL LOW (ref >59)
Globulin, Total: 2.6 g/dL (ref 1.5–4.5)
Glucose: 93 mg/dL (ref 65–99)
POTASSIUM: 4.2 mmol/L (ref 3.5–5.2)
SODIUM: 143 mmol/L (ref 134–144)
Total Bilirubin: 0.4 mg/dL (ref 0.0–1.2)
Total Protein: 6.9 g/dL (ref 6.0–8.5)

## 2014-03-06 LAB — NMR, LIPOPROFILE
Cholesterol: 168 mg/dL (ref 100–199)
HDL CHOLESTEROL BY NMR: 53 mg/dL (ref >39)
HDL Particle Number: 29.8 umol/L — ABNORMAL LOW (ref >=30.5)
LDL PARTICLE NUMBER: 1022 nmol/L — AB (ref <1000)
LDL Size: 20.8 nm (ref >20.5)
LDLC SERPL CALC-MCNC: 89 mg/dL (ref 0–99)
LP-IR Score: 50 — ABNORMAL HIGH (ref <=45)
Small LDL Particle Number: 315 nmol/L (ref <=527)
Triglycerides by NMR: 129 mg/dL (ref 0–149)

## 2014-03-06 NOTE — Telephone Encounter (Signed)
Pt aware of lab results 

## 2014-03-06 NOTE — Telephone Encounter (Signed)
Message copied by Azalee CourseFULP, Aarvi Stotts on Thu Mar 06, 2014  2:06 PM ------      Message from: Bennie PieriniMARTIN, MARY-MARGARET      Created: Thu Mar 06, 2014 12:54 PM       Kidney and liver function stable      Cholesterol looks great      Continue current meds- low fat diet and exercise and recheck in 3 months       ------

## 2014-03-07 NOTE — Telephone Encounter (Signed)
Tried to call - left message with husband to call me back

## 2014-03-10 ENCOUNTER — Telehealth: Payer: Self-pay | Admitting: Pharmacist

## 2014-03-10 NOTE — Telephone Encounter (Signed)
Patient is meeting with GI today.  She will let me know date of colonoscopy so we can determine schedule for holding warfarin and starting lovenox.

## 2014-03-10 NOTE — Telephone Encounter (Signed)
Patient to have colonoscopy Plan for lovenox bridge  Stop warfarin Tuesday august 9th - start lovenox 150mg  daily on 8/10. Last dose of Lovenox 8/13. Restart if OK with GI - 03/29/14 Recheck INR 03/31/14

## 2014-03-12 MED ORDER — ENOXAPARIN SODIUM 150 MG/ML ~~LOC~~ SOLN: 150.0000 mg | SUBCUTANEOUS | Status: DC

## 2014-03-12 NOTE — Telephone Encounter (Signed)
Patient notified and Dr Teena DunkBenson faxed copy of bridging plan.  Rx for lovenox sent into pharmacy

## 2014-03-14 ENCOUNTER — Telehealth: Payer: Self-pay | Admitting: Family Medicine

## 2014-03-14 NOTE — Telephone Encounter (Signed)
Enoxaparin was $100 for #6 syringes - unfortunately there is not an alternative to use while she if off warfarin prior to and after colonoscopy.  Patient ok with price.

## 2014-03-31 ENCOUNTER — Ambulatory Visit (INDEPENDENT_AMBULATORY_CARE_PROVIDER_SITE_OTHER): Payer: BC Managed Care – PPO | Admitting: Pharmacist

## 2014-03-31 DIAGNOSIS — Z86718 Personal history of other venous thrombosis and embolism: Secondary | ICD-10-CM

## 2014-03-31 DIAGNOSIS — I82409 Acute embolism and thrombosis of unspecified deep veins of unspecified lower extremity: Secondary | ICD-10-CM

## 2014-03-31 LAB — POCT INR: INR: 1.1

## 2014-03-31 NOTE — Patient Instructions (Signed)
Anticoagulation Dose Instructions as of 03/31/2014     Christina SmilesSun Mon Tue Wed Thu Fri Sat   New Dose 10 mg 15 mg 15 mg 5 mg 10 mg 5 mg 10 mg   Alt Week 10 mg 5 mg 10 mg 5 mg 10 mg 5 mg 10 mg    Description       Take 1 and 1/2 tablets today and tomorrow, then restart 1/2 tablet Monday, wednesdays and fridays and 1 tablet all other days.       INR was 1.1 today

## 2014-04-17 ENCOUNTER — Ambulatory Visit (INDEPENDENT_AMBULATORY_CARE_PROVIDER_SITE_OTHER): Payer: BC Managed Care – PPO | Admitting: Pharmacist

## 2014-04-17 DIAGNOSIS — I82409 Acute embolism and thrombosis of unspecified deep veins of unspecified lower extremity: Secondary | ICD-10-CM

## 2014-04-17 DIAGNOSIS — Z86718 Personal history of other venous thrombosis and embolism: Secondary | ICD-10-CM

## 2014-04-17 LAB — POCT INR: INR: 2.8

## 2014-04-17 NOTE — Patient Instructions (Signed)
Anticoagulation Dose Instructions as of 04/17/2014     Glynis Smiles Tue Wed Thu Fri Sat   New Dose 10 mg 5 mg 10 mg 5 mg 10 mg 5 mg 10 mg    Description       Continue same dose of warfarin  -  1/2 tablet Monday, wednesdays and fridays and 1 tablet all other days.       INR was 2.8 today

## 2014-06-05 ENCOUNTER — Ambulatory Visit (INDEPENDENT_AMBULATORY_CARE_PROVIDER_SITE_OTHER): Payer: BC Managed Care – PPO | Admitting: Pharmacist

## 2014-06-05 DIAGNOSIS — Z23 Encounter for immunization: Secondary | ICD-10-CM

## 2014-06-05 DIAGNOSIS — I82409 Acute embolism and thrombosis of unspecified deep veins of unspecified lower extremity: Secondary | ICD-10-CM

## 2014-06-05 DIAGNOSIS — Z86718 Personal history of other venous thrombosis and embolism: Secondary | ICD-10-CM

## 2014-06-05 LAB — POCT INR: INR: 1.6

## 2014-06-05 NOTE — Patient Instructions (Signed)
Anticoagulation Dose Instructions as of 06/05/2014     Christina SmilesSun Mon Tue Wed Thu Fri Sat   New Dose 10 mg 5 mg 10 mg 10 mg 10 mg 5 mg 10 mg    Description       Take and extra 1/2 tablet today, then increase  warfarin 10mg  -  1/2 tablet Monday and fridays and 1 tablet all other days.       INR was 1.6 today (too thick)

## 2014-06-09 ENCOUNTER — Telehealth: Payer: Self-pay | Admitting: Nurse Practitioner

## 2014-06-09 NOTE — Telephone Encounter (Signed)
Patient wanted to know if lemonade could affect her INR.  Advised that this would be unlikely.  Continue with changed in warfarin dose discussed at last appt.

## 2014-07-03 ENCOUNTER — Ambulatory Visit (INDEPENDENT_AMBULATORY_CARE_PROVIDER_SITE_OTHER): Payer: BC Managed Care – PPO | Admitting: Pharmacist

## 2014-07-03 VITALS — BP 110/70 | HR 77

## 2014-07-03 DIAGNOSIS — Z86718 Personal history of other venous thrombosis and embolism: Secondary | ICD-10-CM

## 2014-07-03 DIAGNOSIS — I82409 Acute embolism and thrombosis of unspecified deep veins of unspecified lower extremity: Secondary | ICD-10-CM

## 2014-07-03 LAB — POCT INR: INR: 3.8

## 2014-07-03 NOTE — Patient Instructions (Signed)
Anticoagulation Dose Instructions as of 07/03/2014      Christina SmilesSun Mon Tue Wed Thu Fri Sat   New Dose 10 mg 5 mg 10 mg 5 mg 10 mg 5 mg 10 mg    Description         No warfarin tomorrow - Friday, November 20th.  Then restart warfarin 10mg  -  1/2 tablet Monday, Wednesdays and Fridays and 1 tablet all other days.       INR was 3.8 today

## 2014-07-17 ENCOUNTER — Telehealth: Payer: Self-pay | Admitting: Family Medicine

## 2014-07-17 NOTE — Telephone Encounter (Signed)
Appointment given for 3:30 tomorrow with Christina [name]

## 2014-07-18 ENCOUNTER — Encounter: Payer: Self-pay | Admitting: Family Medicine

## 2014-07-18 ENCOUNTER — Ambulatory Visit (INDEPENDENT_AMBULATORY_CARE_PROVIDER_SITE_OTHER): Payer: BC Managed Care – PPO | Admitting: Family Medicine

## 2014-07-18 VITALS — BP 135/84 | HR 83 | Temp 98.0°F | Ht 68.0 in | Wt 228.6 lb

## 2014-07-18 DIAGNOSIS — S81801A Unspecified open wound, right lower leg, initial encounter: Secondary | ICD-10-CM

## 2014-07-18 DIAGNOSIS — S91001A Unspecified open wound, right ankle, initial encounter: Secondary | ICD-10-CM

## 2014-07-18 DIAGNOSIS — S81001A Unspecified open wound, right knee, initial encounter: Secondary | ICD-10-CM

## 2014-07-18 MED ORDER — MUPIROCIN CALCIUM 2 % EX CREA: 1.0000 "application " | TOPICAL_CREAM | Freq: Two times a day (BID) | CUTANEOUS | Status: DC

## 2014-07-20 LAB — AEROBIC CULTURE

## 2014-07-21 NOTE — Progress Notes (Signed)
   Subjective:    Patient ID: Christina [name], female    DOB: 1951-01-16, 63 y.o.   MRN: [id number]  HPI  Recent onset wound on inner malleolus.  Hx same.  No drainage.  Using OTC Neosporin    Review of Systems  Constitutional: Negative.   HENT: Negative.   Eyes: Negative.   Respiratory: Negative.   Cardiovascular: Negative.   Gastrointestinal: Negative.   Endocrine: Negative.   Genitourinary: Negative.   Skin: Positive for wound.  Hematological: Negative.   Psychiatric/Behavioral: Negative.        Objective:   Physical Exam  Skin:  Wound has no drainage or erythema.  Culture taken          Assessment & Plan:  BP 135/84 mmHg  Pulse 83  Temp(Src) 98 F (36.7 C) (Oral)  Ht 5\' 8"  (1.727 m)  Wt 228 lb 9.6 oz (103.692 kg)  BMI 34.77 kg/m2   1. Open wound of knee, leg (except thigh), and ankle, complicated, right, initial encounter Aerobic culture

## 2014-07-28 ENCOUNTER — Ambulatory Visit (INDEPENDENT_AMBULATORY_CARE_PROVIDER_SITE_OTHER): Payer: BC Managed Care – PPO | Admitting: Nurse Practitioner

## 2014-07-28 ENCOUNTER — Encounter: Payer: Self-pay | Admitting: Nurse Practitioner

## 2014-07-28 VITALS — BP 122/76 | HR 94 | Temp 97.9°F | Ht 68.0 in | Wt 227.0 lb

## 2014-07-28 DIAGNOSIS — I83214 Varicose veins of right lower extremity with both ulcer of heel and midfoot and inflammation: Secondary | ICD-10-CM

## 2014-07-28 DIAGNOSIS — L97319 Non-pressure chronic ulcer of right ankle with unspecified severity: Principal | ICD-10-CM

## 2014-07-28 DIAGNOSIS — I83013 Varicose veins of right lower extremity with ulcer of ankle: Secondary | ICD-10-CM

## 2014-07-28 DIAGNOSIS — I83218 Varicose veins of right lower extremity with both ulcer of other part of lower extremity and inflammation: Secondary | ICD-10-CM

## 2014-07-28 DIAGNOSIS — I83219 Varicose veins of right lower extremity with both ulcer of unspecified site and inflammation: Secondary | ICD-10-CM

## 2014-07-28 DIAGNOSIS — I83215 Varicose veins of right lower extremity with both ulcer other part of foot and inflammation: Secondary | ICD-10-CM

## 2014-07-28 DIAGNOSIS — I83212 Varicose veins of right lower extremity with both ulcer of calf and inflammation: Secondary | ICD-10-CM

## 2014-07-28 DIAGNOSIS — I83211 Varicose veins of right lower extremity with both ulcer of thigh and inflammation: Secondary | ICD-10-CM

## 2014-07-28 DIAGNOSIS — I83213 Varicose veins of right lower extremity with both ulcer of ankle and inflammation: Secondary | ICD-10-CM

## 2014-07-28 MED ORDER — CIPROFLOXACIN HCL 500 MG PO TABS: 500.0000 mg | ORAL_TABLET | Freq: Two times a day (BID) | ORAL | Status: DC

## 2014-07-28 NOTE — Patient Instructions (Signed)
Stasis Ulcer Stasis ulcers occur in the legs when the circulation is damaged. An ulcer may look like a small hole in the skin.  CAUSES Stasis ulcers occur because your veins do not work properly. Veins have valves that help the blood return to the heart. If these valves do not work right, blood flows backwards and backs up into the veins near the skin. This condition causes the veins to become larger because of increased pressure and may lead to a stasis ulcer. SYMPTOMS   Shallow (superficial) sore on the leg.  Clear drainage or weeping from the sore.  Leg pain or a feeling of heaviness. This may be worse at the end of the day.  Leg swelling.  Skin color changes. DIAGNOSIS  Your caregiver will make a diagnosis by examining your leg. Your caregiver may order tests such as an ultrasound or other studies to evaluate the blood flow of the leg. HOME CARE INSTRUCTIONS   Do not stand or sit in one position for long periods of time. Do not sit with your legs crossed. Rest with your legs raised during the day. If possible, it is best if you can elevate your legs above your heart for 30 minutes, 3 to 4 times a day.  Wear elastic stockings or support hose. Do not wear other tight encircling garments around legs, pelvis, or waist. This causes increased pressure in your veins. If your caregiver has applied compressive medicated wraps, use them as instructed.  Walk as much as possible to increase blood flow. If you are taking long rides in a car or plane, take a break to walk around every 2 hours. If not already on aspirin, take a baby aspirin before long trips unless you have medical reasons that prohibit this.  Raise the foot of your bed at night with 2-inch blocks if approved by your caregiver. This may not be desirable if you have heart failure or breathing problems.  If you get a cut in the skin over the vein and the vein bleeds, lie down with your leg raised and gently clean the area with a clean  cloth. Apply pressure on the cut until the bleeding stops. Then place a dressing on the cut. See your caregiver if it continues to bleed or needs stitches. Also, see your caregiver if you develop an infection.Signs of an infection include a fever, redness, increased pain, and drainage of pus.  If your caregiver has given you a follow-up appointment, it is very important to keep that appointment. Not keeping the appointment could result in a chronic or permanent injury, pain, and disability. If there is any problem keeping the appointment, call your caregiver for assistance. SEEK IMMEDIATE MEDICAL CARE IF:  The ulcer area starts to break down.  You have pain, redness, tenderness, pus, or hard swelling in your leg over a vein or near the ulcer.  Your leg pain is uncomfortable.  You develop an unexplained fever.  You develop chest pain or shortness of breath. Document Released: 04/26/2001 Document Revised: 10/24/2011 Document Reviewed: 11/21/2010 Exit[employer] Patient Information 2015 [employer]. This information is not intended to replace advice given to you by your health care provider. Make sure you discuss any questions you have with your health care provider.  

## 2014-07-28 NOTE — Progress Notes (Signed)
   Subjective:    Patient ID: Christina [name], female    DOB: [birth year], 63 y.o.   MRN: [id number]  HPI Patient in today c/o sore on right leg- noticed it breaking open about 2 weeks ago. Has had same lesion intermittently for several years. Was seen by Dr. Hyacinth MeekerMiller 2 weeks ago and was given mupironcin cream.    Review of Systems  Constitutional: Negative.   HENT: Negative.   Respiratory: Negative.   Cardiovascular: Negative.   Genitourinary: Negative.   Neurological: Negative.   Psychiatric/Behavioral: Negative.   All other systems reviewed and are negative.      Objective:   Physical Exam  Constitutional: She appears well-developed and well-nourished.  Cardiovascular: Normal rate, regular rhythm and normal heart sounds.   Pulmonary/Chest: Effort normal and breath sounds normal.  Neurological: She is alert.  Skin: Skin is warm.  2cm annular open lesion right ankle.  Psychiatric: She has a normal mood and affect. Her behavior is normal. Judgment and thought content normal.   BP 122/76 mmHg  Pulse 94  Temp(Src) 97.9 F (36.6 C) (Oral)  Ht 5\' 8"  (1.727 m)  Wt 227 lb (102.967 kg)  BMI 34.52 kg/m2        Assessment & Plan:   1. Venous stasis ulcer of ankle, right    Meds ordered this encounter  Medications  . ciprofloxacin (CIPRO) 500 MG tablet    Sig: Take 1 tablet (500 mg total) by mouth 2 (two) times daily.    Dispense:  20 tablet    Refill:  0    Order Specific Question:  Supervising Provider    Answer:  Deborra MedinaMOORE, DONALD W [[id number]]   Soak in epsom salt BID RTO in 1 week to recheck  Mary-Margaret Daphine DeutscherMartin, FNP

## 2014-08-04 ENCOUNTER — Other Ambulatory Visit: Payer: Self-pay | Admitting: Nurse Practitioner

## 2014-08-04 ENCOUNTER — Ambulatory Visit (INDEPENDENT_AMBULATORY_CARE_PROVIDER_SITE_OTHER): Payer: BC Managed Care – PPO | Admitting: Pharmacist

## 2014-08-04 DIAGNOSIS — Z86718 Personal history of other venous thrombosis and embolism: Secondary | ICD-10-CM

## 2014-08-04 DIAGNOSIS — S91002S Unspecified open wound, left ankle, sequela: Secondary | ICD-10-CM

## 2014-08-04 DIAGNOSIS — I82409 Acute embolism and thrombosis of unspecified deep veins of unspecified lower extremity: Secondary | ICD-10-CM

## 2014-08-04 LAB — POCT INR: INR: 3.3

## 2014-08-04 NOTE — Patient Instructions (Signed)
Anticoagulation Dose Instructions as of 08/04/2014      Glynis SmilesSun Mon Tue Wed Thu Fri Sat   New Dose 10 mg 5 mg 10 mg 5 mg 10 mg 5 mg 10 mg    Description        No warfarin tomorrow - Tuesday, December 22nd.  Then restart warfarin 10mg  -  1/2 tablet Monday, Wednesdays and Fridays and 1 tablet all other days.       INR was 3.3 today

## 2014-09-10 ENCOUNTER — Ambulatory Visit (INDEPENDENT_AMBULATORY_CARE_PROVIDER_SITE_OTHER): Payer: BC Managed Care – PPO

## 2014-09-10 ENCOUNTER — Ambulatory Visit (INDEPENDENT_AMBULATORY_CARE_PROVIDER_SITE_OTHER): Payer: BC Managed Care – PPO | Admitting: Nurse Practitioner

## 2014-09-10 ENCOUNTER — Encounter: Payer: Self-pay | Admitting: Nurse Practitioner

## 2014-09-10 VITALS — BP 123/74 | HR 89 | Temp 97.0°F | Ht 68.0 in | Wt 228.0 lb

## 2014-09-10 DIAGNOSIS — I10 Essential (primary) hypertension: Secondary | ICD-10-CM

## 2014-09-10 DIAGNOSIS — Z86718 Personal history of other venous thrombosis and embolism: Secondary | ICD-10-CM

## 2014-09-10 DIAGNOSIS — Z7901 Long term (current) use of anticoagulants: Secondary | ICD-10-CM

## 2014-09-10 DIAGNOSIS — Z720 Tobacco use: Secondary | ICD-10-CM

## 2014-09-10 DIAGNOSIS — Z87891 Personal history of nicotine dependence: Secondary | ICD-10-CM

## 2014-09-10 DIAGNOSIS — Z13820 Encounter for screening for osteoporosis: Secondary | ICD-10-CM

## 2014-09-10 DIAGNOSIS — Z6835 Body mass index (BMI) 35.0-35.9, adult: Secondary | ICD-10-CM

## 2014-09-10 DIAGNOSIS — E785 Hyperlipidemia, unspecified: Secondary | ICD-10-CM

## 2014-09-10 LAB — POCT INR: INR: 3.3

## 2014-09-10 MED ORDER — WARFARIN SODIUM 10 MG PO TABS: ORAL_TABLET | ORAL | Status: DC

## 2014-09-10 MED ORDER — OLMESARTAN-AMLODIPINE-HCTZ 40-5-12.5 MG PO TABS: 1.0000 | ORAL_TABLET | Freq: Every day | ORAL | Status: DC

## 2014-09-10 MED ORDER — ATORVASTATIN CALCIUM 40 MG PO TABS: 40.0000 mg | ORAL_TABLET | Freq: Every day | ORAL | Status: DC

## 2014-09-10 NOTE — Addendum Note (Signed)
Addended by: Monica BectonHODGES, Stanly Si F on: 09/10/2014 04:36 PM   Modules accepted: Orders

## 2014-09-10 NOTE — Progress Notes (Signed)
Subjective:    Patient ID: Christina [name], female    DOB: 09/12/1950, 64 y.o.   MRN: [id number]  Patient in today for follow up of chronic medical problems. No complaints today.  Hypertension This is a chronic problem. The current episode started more than 1 year ago. The problem is unchanged. The problem is controlled. Pertinent negatives include no headaches or shortness of breath. Risk factors for coronary artery disease include dyslipidemia, family history, obesity, post-menopausal state and sedentary lifestyle. Past treatments include angiotensin blockers, calcium channel blockers and diuretics. The current treatment provides moderate improvement. Compliance problems include diet and exercise.   Hyperlipidemia This is a chronic problem. The current episode started more than 1 year ago. The problem is controlled. Recent lipid tests were reviewed and are normal. Exacerbating diseases include obesity. She has no history of diabetes or hypothyroidism. Pertinent negatives include no myalgias or shortness of breath. Current antihyperlipidemic treatment includes statins. The current treatment provides moderate improvement of lipids. Compliance problems include adherence to diet and adherence to exercise.  Risk factors for coronary artery disease include dyslipidemia, family history, hypertension and obesity.  Hx DVT Currently on coumadin therapy- doing well- no bleeding, no compint of lower ext pain or swelling.   Review of Systems  Constitutional: Negative.   HENT: Negative.   Respiratory: Negative for shortness of breath.   Genitourinary: Negative.   Musculoskeletal: Negative for myalgias.  Neurological: Negative for headaches.  Psychiatric/Behavioral: Negative.   All other systems reviewed and are negative.      Objective:   Physical Exam  Constitutional: She is oriented to person, place, and time. She appears well-developed and well-nourished.  HENT:  Nose: Nose normal.  Mouth/Throat:  Oropharynx is clear and moist.  Eyes: EOM are normal.  Neck: Trachea normal, normal range of motion and full passive range of motion without pain. Neck supple. No JVD present. Carotid bruit is not present. No thyromegaly present.  Cardiovascular: Normal rate, regular rhythm, normal heart sounds and intact distal pulses.  Exam reveals no gallop and no friction rub.   No murmur heard. Pulmonary/Chest: Effort normal and breath sounds normal.  Abdominal: Soft. Bowel sounds are normal. She exhibits no distension and no mass. There is no tenderness.  Musculoskeletal: Normal range of motion.  Lymphadenopathy:    She has no cervical adenopathy.  Neurological: She is alert and oriented to person, place, and time. She has normal reflexes.  Skin: Skin is warm and dry.  Psychiatric: She has a normal mood and affect. Her behavior is normal. Judgment and thought content normal.    BP 123/74 mmHg  Pulse 89  Temp(Src) 97 F (36.1 C) (Oral)  Ht _0  (1.727 m)  Wt 228 lb (103.42 kg)  BMI 34.68 kg/m2  Results for orders placed or performed in visit on 09/10/14  POCT INR  Result Value Ref Range   INR 3.3    Chest x ray- normal- no acute findings-Preliminary reading by Ronnald Collum, FNP  [facility]   EKG- Kerry Hough, FNP      Assessment & Plan:   1. History of DVT of lower extremity - POCT INR  2. Essential hypertension, benign Do not add salt to diet - CMP14+EGFR - Olmesartan-Amlodipine-HCTZ (TRIBENZOR) 40-5-12.5 MG TABS; Take 1 tablet by mouth daily.  Dispense: 30 tablet; Refill: 5  3. Hyperlipidemia with target LDL less than 100 Low fat diet - NMR, lipoprofile - atorvastatin (LIPITOR) 40 MG tablet; Take 1 tablet (40 mg total) by mouth daily.  Dispense: 30 tablet; Refill: 5  4. On anticoagulant therapy Watch for bleeding- see INR notes from today - warfarin (COUMADIN) 10 MG tablet; TAKE 1 TABLET BY MOUTH EVERY DAY  Dispense: 30 tablet; Refill: 5  5. BMI  35.0-35.9,adult Discussed diet and exercise for person with BMI >25 Will recheck weight in 3-6 months   Ordered dexascan Td and pneumonia vaccine today Labs pending Health maintenance reviewed Diet and exercise encouraged Continue all meds Follow up  In 3 month   [place], FNP

## 2014-09-11 LAB — CMP14+EGFR
ALBUMIN: 4.6 g/dL (ref 3.6–4.8)
ALK PHOS: 117 IU/L (ref 39–117)
ALT: 45 IU/L — ABNORMAL HIGH (ref 0–32)
AST: 44 IU/L — ABNORMAL HIGH (ref 0–40)
Albumin/Globulin Ratio: 1.5 (ref 1.1–2.5)
BILIRUBIN TOTAL: 0.3 mg/dL (ref 0.0–1.2)
BUN/Creatinine Ratio: 17 (ref 11–26)
BUN: 28 mg/dL — ABNORMAL HIGH (ref 8–27)
CO2: 23 mmol/L (ref 18–29)
CREATININE: 1.62 mg/dL — AB (ref 0.57–1.00)
Calcium: 10.2 mg/dL (ref 8.7–10.3)
Chloride: 102 mmol/L (ref 97–108)
GFR calc Af Amer: 38 mL/min/{1.73_m2} — ABNORMAL LOW (ref >59)
GFR calc non Af Amer: 33 mL/min/{1.73_m2} — ABNORMAL LOW (ref >59)
Globulin, Total: 3 g/dL (ref 1.5–4.5)
Glucose: 94 mg/dL (ref 65–99)
Potassium: 4.6 mmol/L (ref 3.5–5.2)
Sodium: 144 mmol/L (ref 134–144)
Total Protein: 7.6 g/dL (ref 6.0–8.5)

## 2014-09-11 LAB — NMR, LIPOPROFILE
Cholesterol: 181 mg/dL (ref 100–199)
HDL Cholesterol by NMR: 55 mg/dL (ref >39)
HDL Particle Number: 31.5 umol/L (ref >=30.5)
LDL Particle Number: 1436 nmol/L — ABNORMAL HIGH (ref <1000)
LDL Size: 20.9 nm (ref >20.5)
LDL-C: 99 mg/dL (ref 0–99)
LP-IR Score: 56 — ABNORMAL HIGH (ref <=45)
Small LDL Particle Number: 675 nmol/L — ABNORMAL HIGH (ref <=527)
TRIGLYCERIDES BY NMR: 134 mg/dL (ref 0–149)

## 2014-09-11 LAB — CBC WITH DIFFERENTIAL/PLATELET
BASOS ABS: 0 10*3/uL (ref 0.0–0.2)
BASOS: 0 %
EOS ABS: 0.1 10*3/uL (ref 0.0–0.4)
Eos: 1 %
HEMATOCRIT: 37 % (ref 34.0–46.6)
Hemoglobin: 12.7 g/dL (ref 11.1–15.9)
Immature Grans (Abs): 0 10*3/uL (ref 0.0–0.1)
Immature Granulocytes: 0 %
LYMPHS ABS: 2.3 10*3/uL (ref 0.7–3.1)
LYMPHS: 25 %
MCH: 25 pg — AB (ref 26.6–33.0)
MCHC: 34.3 g/dL (ref 31.5–35.7)
MCV: 73 fL — ABNORMAL LOW (ref 79–97)
MONOCYTES: 6 %
Monocytes Absolute: 0.6 10*3/uL (ref 0.1–0.9)
NEUTROS ABS: 6.1 10*3/uL (ref 1.4–7.0)
NEUTROS PCT: 68 %
Platelets: 316 10*3/uL (ref 150–379)
RBC: 5.08 x10E6/uL (ref 3.77–5.28)
RDW: 14.3 % (ref 12.3–15.4)
WBC: 9.2 10*3/uL (ref 3.4–10.8)

## 2014-09-11 LAB — SPECIMEN STATUS REPORT

## 2014-09-15 ENCOUNTER — Telehealth: Payer: Self-pay | Admitting: Nurse Practitioner

## 2014-09-15 NOTE — Telephone Encounter (Signed)
-----   Message from [facility] Martin, FNP sent at 09/11/2014  3:05 PM EST ----- Kidney and liver function stable Creatine is going up some- avoid all NSAIDS LDL particle numbers are going up- was much better at last visit- make sure take lipitor daily and watch diet- if can't get down will need to change meds Cbc normal Continue current meds- low fat diet and exercise and recheck in 3 months

## 2014-09-15 NOTE — Telephone Encounter (Signed)
Pt aware of lab results 

## 2014-09-16 ENCOUNTER — Telehealth: Payer: Self-pay | Admitting: Nurse Practitioner

## 2014-09-24 ENCOUNTER — Other Ambulatory Visit: Payer: BC Managed Care – PPO

## 2014-09-24 ENCOUNTER — Ambulatory Visit: Payer: BC Managed Care – PPO

## 2014-10-08 ENCOUNTER — Ambulatory Visit: Payer: BC Managed Care – PPO

## 2014-10-08 ENCOUNTER — Other Ambulatory Visit: Payer: BC Managed Care – PPO

## 2014-10-15 ENCOUNTER — Encounter: Payer: Self-pay | Admitting: Pharmacist

## 2014-10-15 ENCOUNTER — Ambulatory Visit (INDEPENDENT_AMBULATORY_CARE_PROVIDER_SITE_OTHER): Payer: BC Managed Care – PPO

## 2014-10-15 ENCOUNTER — Ambulatory Visit (INDEPENDENT_AMBULATORY_CARE_PROVIDER_SITE_OTHER): Payer: BC Managed Care – PPO | Admitting: Pharmacist

## 2014-10-15 ENCOUNTER — Other Ambulatory Visit: Payer: Self-pay | Admitting: Nurse Practitioner

## 2014-10-15 VITALS — Ht 68.0 in | Wt 230.0 lb

## 2014-10-15 DIAGNOSIS — I82409 Acute embolism and thrombosis of unspecified deep veins of unspecified lower extremity: Secondary | ICD-10-CM

## 2014-10-15 DIAGNOSIS — Z780 Asymptomatic menopausal state: Secondary | ICD-10-CM

## 2014-10-15 DIAGNOSIS — Z86718 Personal history of other venous thrombosis and embolism: Secondary | ICD-10-CM | POA: Diagnosis not present

## 2014-10-15 LAB — POCT INR: INR: 2.8

## 2014-10-15 NOTE — Progress Notes (Signed)
Patient ID: Paulla DollyCobbie Sawhney, female   DOB: [birth year], 64 y.o.   MRN: [id number]   Current Height: Height: 5\' 8"  (172.7 cm)      Max Lifetime Height:  5' 9.5" Current Weight: Weight: 230 lb (104.327 kg)       Ethnicity:African American   HPI: Patient here today to discuss results of DEXA and have INR rechecked  Current warfarin dose is warfarin 10mg  1/2 tablet mondays, wednesdays and fridays and 1 tablet all other days.  No missed doses No s/s of bleeding or thromboembolism  Back Pain?  Yes       Kyphosis?  No Prior fracture?  Yes - foot, left                                                             PMH: Age at menopause:  [age]'s Hysterectomy?  No Oophorectomy?  No HRT? No Steroid Use?  No Thyroid med?  No History of cancer?  No History of digestive disorders (ie Crohn's)?  No Current or previous eating disorders?  No Last Vitamin D Result:  Needed with next labs Last GFR Result:  33 (09/10/2014)   FH/SH: Family history of osteoporosis?  No Parent with history of hip fracture?  No Family history of breast cancer?  No Exercise?  No Smoking?  No Alcohol?  No    Calcium Assessment Calcium Intake  # of servings/day  Calcium mg  Milk (8 oz) 1  x  300  = 300mg   Yogurt (4 oz) 0 x  200 = 0  Cheese (1 oz) 1 x  200 = 200mg   Other Calcium sources   250mg   Ca supplement 0 = 0   Estimated calcium intake per day 750mg     DEXA Results Date of Test T-Score for AP Spine L1-L4 T-Score for Total Left Hip T-Score for Total Right Hip  03/02/20016 1.3 0.5 0.2  01/02/2006 1.2 0.2 -0.1             INR = 2.8 today  Assessment: Normal BMD Therapeutic anticogaulation  Recommendations: 1.   Anticoagulation Dose Instructions as of 10/15/2014      Glynis SmilesSun Mon Tue Wed Thu Fri Sat   New Dose 10 mg 5 mg 10 mg 5 mg 10 mg 5 mg 10 mg    Description        Continue warfarin 10mg  -  1/2 tablet Monday, Wednesdays and Fridays and 1 tablet all other days.       2.  recommend calcium 1200mg   daily through supplementation or diet.  3.  recommend weight bearing exercise - 30 minutes at least 4 days per week.   4.  Counseled and educated about fall risk and prevention.  Recheck DEXA:  5 years  Time spent counseling patient:  30 minutes   Henrene Pastorammy Aarika Moon, PharmD, CPP

## 2014-10-15 NOTE — Patient Instructions (Addendum)
Anticoagulation Dose Instructions as of 10/15/2014      Dorene Grebe Tue Wed Thu Fri Sat   New Dose 10 mg 5 mg 10 mg 5 mg 10 mg 5 mg 10 mg    Description        Continue warfarin 44m -  1/2 tablet Monday, Wednesdays and Fridays and 1 tablet all other days.       INR was 2.8 today  Calcium & Vitamin D: The Facts  Why is calcium and vitamin D consumption important? Calcium: . Most Americans do not consume adequate amounts of calcium! Calcium is required for proper muscle function, nerve communication, bone support, and many other functions in the body.  . The body uses bones as a source of calcium. Bones 'remodel' themselves continuously - the body constantly breaks bone down to release calcium and rebuilds bones by replacing calcium in the bone later.  . As we get older, the rate of bone breakdown occurs faster than bone rebuilding which could lead to osteopenia, osteoporosis, and possible fractures.   Vitamin D: . People naturally make vitamin D in the body when sunlight hits the skin and triggers a process that leads to vitamin D production. This natural vitamin D production requires about 10-15 minutes of sun exposure on the hands, arms, and face at least 2-3 times per week. However, due to decreased sun exposure and the use of sunscreen, most people will need to get additional vitamin D from foods or supplements. Your doctor can measure your body's vitamin D level through a simple blood test to determine your daily vitamin D needs.  . Vitamin D is used to help the body absorb calcium, maintain bone health, help the immune system, and reduce inflammation. It also plays a role in muscle performance, balance and risk of falling.  . Vitamin D deficiency can lead to osteomalacia or softening of the bones, bone pain, and muscle weakness.   The recommended daily allowance of Calcium and Vitamin D varies for different age groups. Age group Calcium (mg) Vitamin D (IU)  Females and Males: Age 84-50  1000 mg 600 IU  Females: Age 66- 281200 mg 600 IU  Males: Age 66-701000 mg 600 IU  Females and Males: Age 57+ 1200 mg 800 IU  Pregnant/lactating Females age 66-501000 mg 600 IU   How much Calcium do you get in your diet? Calcium Intake # of servings per day  Total calcium (mg)  Skim milk, 2% milk (1 cup) _________ x 300 mg   Yogurt (1 small container) _________ x 200 mg   Cheese (1oz) _________ x 200 mg   Cottage Cheese (1 cup)             ________ x 150 mg   Almond milk (1 cup) _________ x 450 mg   Fortified Orange Juice (1 cup) _________ x 300 mg   Broccoli or spinach ( 1 cup) _________ x 100 mg   Salmon (3 oz) _________ x 150 mg    Almonds (1/4 cup) _______ x 90 mg      How do we get Calcium and Vitamin D in our diet? Calcium: . Obtaining calcium from the diet is the most preferred way to reach the recommended daily goal. If this goal is not reached through diet, calcium supplements are available.  . Calcium is found in many foods including: dairy products, dark leafy vegetables (like broccoli, kale, and spinach), fish, and fortified products like juices and cereals.  .Marland Kitchen  The food label will have a %DV (percent daily value) listed showing the amount of calcium per serving. To determine the total mg per serving, simply replace the % with zero (0).  For example, Almond Breeze almond milk contains 45% DV of calcium or 469m per 1 cup.  . You can increase the amount of calcium in your diet by using more calcium products in your daily meals. Use yogurt and fruit to make smoothies or use yogurt to top baked potatoes or make whipped potatoes. Sprinkle low fat cheese onto salads or into egg white omelets. You can even add non-fat dry milk powder (3032mcalcium per 1/3 cup) to hot cereals, meat loaf, soups, or potatoes.  . Calcium supplements come in many forms including tablets, chewables, and gummies. Be sure to read the label to determine the correct number of tablets per serving and whether  or not to take the supplement with food.  . Calcium carbonate products (Oscal, Caltrate, and Viactiv) are generally better absorbed when taken with food while calcium citrate products like Citracal can be taken with or without food.  . The body can only absorb about 600 mg of calcium at one time. It is recommended to take calcium supplements in small amounts several times per day.  However, taking it all at once is better than not taking it at all. . Increasing your intake of calcium is essential for bone health, but may also lead to some side effects like constipation, increased gas, bloating or abdominal cramping. To help reduce these side effects, start with 1 tablet per day and slowly increase your intake of the supplement to the recommended doses. It is also recommended that you drink plenty of water each day. Vitamin D: . Very few foods naturally contain vitamin D. However, it is found in saltwater fish (like tuna, salmon and mackerel), beef liver, egg yolks, cheese and vitamin D fortified foods (like yogurt, cereals, orange juice and milk) . The amount of vitamin D in each food or product is listed as %DV on the product label. To determine the total amount of vitamin D per serving, drop the % sign and multiply the number by 4. For example, 1 cup of Almond Breeze almond milk contains 25% DV vitamin D or 100 IU per serving (25 x 4 =100). . Vitamin D is also found in multivitamins and supplements and may be listed as ergocalciferol (vitamin D2) or cholecalciferol (vitamin D3). Each of these forms of vitamin D are equivalent and the daily recommended intake will vary based on your age and the vitamin D levels in your body. Follow your doctor's recommendation for vitamin D intake.                    Exercise for Strong Bones  Exercise is important to build and maintain strong bones / bone density.  There are 2 types of exercises that are important to building and maintaining strong bones:  Weight-  bearing and muscle-stregthening.  Weight-bearing Exercises  These exercises include activities that make you move against gravity while staying upright. Weight-bearing exercises can be high-impact or low-impact.  High-impact weight-bearing exercises help build bones and keep them strong. If you have broken a bone due to osteoporosis or are at risk of breaking a bone, you may need to avoid high-impact exercises. If you're not sure, you should check with your healthcare provider.  Examples of high-impact weight-bearing exercises are: Dancing  Doing high-impact aerobics  Hiking  Jogging/running  Jumping Rope  Stair climbing  Tennis  Low-impact weight-bearing exercises can also help keep bones strong and are a safe alternative if you cannot do high-impact exercises.   Examples of low-impact weight-bearing exercises are: Using elliptical training machines  Doing low-impact aerobics  Using stair-step machines  Fast walking on a treadmill or outside   Muscle-Strengthening Exercises These exercises include activities where you move your body, a weight or some other resistance against gravity. They are also known as resistance exercises and include: Lifting weights  Using elastic exercise bands  Using weight machines  Lifting your own body weight  Functional movements, such as standing and rising up on your toes  Yoga and Pilates can also improve strength, balance and flexibility. However, certain positions may not be safe for people with osteoporosis or those at increased risk of broken bones. For example, exercises that have you bend forward may increase the chance of breaking a bone in the spine.   Non-Impact Exercises There are other types of exercises that can help prevent falls.  Non-impact exercises can help you to improve balance, posture and how well you move in everyday activities. Some of these exercises include: Balance exercises that strengthen your legs and test your  balance, such as Tai Chi, can decrease your risk of falls.  Posture exercises that improve your posture and reduce rounded or "sloping" shoulders can help you decrease the chance of breaking a bone, especially in the spine.  Functional exercises that improve how well you move can help you with everyday activities and decrease your chance of falling and breaking a bone. For example, if you have trouble getting up from a chair or climbing stairs, you should do these activities as exercises.   **A physical therapist can teach you balance, posture and functional exercises. He/she can also help you learn which exercises are safe and appropriate for you.  [facility] has a physical therapy office in [place] in front of our office and referrals can be made for assessments and treatment as needed and strength and balance training.  If you would like to have an assessment with [facility] and our physical therapy team please let a nurse or provider know.   Fall Prevention and Home Safety Falls cause injuries and can affect all age groups. It is possible to use preventive measures to significantly decrease the likelihood of falls. There are many simple measures which can make your home safer and prevent falls. OUTDOORS  Repair cracks and edges of walkways and driveways.  Remove high doorway thresholds.  Trim shrubbery on the main path into your home.  Have good outside lighting.  Clear walkways of tools, rocks, debris, and clutter.  Check that handrails are not broken and are securely fastened. Both sides of steps should have handrails.  Have leaves, snow, and ice cleared regularly.  Use sand or salt on walkways during winter months.  In the garage, clean up grease or oil spills.  Be cautious of gravel parking lots and parking areas under trees that produce acorns or "pom-poms" BATHROOM  Install night lights.  Install grab bars by the toilet and in the tub and shower.  Use non-skid mats or  decals in the tub or shower.  Place a plastic non-slip stool in the shower to sit on, if needed.  Keep floors dry and clean up all water on the floor immediately.  Remove soap buildup in the tub or shower on a regular basis.  Secure bath mats with non-slip, double-sided  rug tape.  Remove throw rugs and tripping hazards from the floors. BEDROOMS  Install night lights.  Make sure a bedside light is easy to reach.  Do not use oversized bedding.  Keep a telephone by your bedside.  Have a firm chair with side arms to use for getting dressed.  Remove throw rugs and tripping hazards from the floor. KITCHEN  Keep handles on pots and pans turned toward the center of the stove. Use back burners when possible.  Clean up spills quickly and allow time for drying.  Avoid walking on wet floors.  Avoid hot utensils and knives.  Position shelves so they are not too high or low.  Place commonly used objects within easy reach.  If necessary, use a sturdy step stool with a grab bar when reaching.  Keep electrical cables out of the way.  Do not use floor polish or wax that makes floors slippery. If you must use wax, use non-skid floor wax.  Remove throw rugs and tripping hazards from the floor. STAIRWAYS  Never leave objects on stairs.  Place handrails on both sides of stairways and use them. Fix any loose handrails. Make sure handrails on both sides of the stairways are as long as the stairs.  Check carpeting to make sure it is firmly attached along stairs. Make repairs to worn or loose carpet promptly.  Avoid placing throw rugs at the top or bottom of stairways, or properly secure the rug with carpet tape to prevent slippage. Get rid of throw rugs, if possible.  Have an electrician put in a light switch at the top and bottom of the stairs. OTHER FALL PREVENTION TIPS  Wear low-heel or rubber-soled shoes that are supportive and fit well. Wear closed toe shoes.  When using a  stepladder, make sure it is fully opened and both spreaders are firmly locked. Do not climb a closed stepladder.  Add color or contrast paint or tape to grab bars and handrails in your home. Place contrasting color strips on first and last steps.  Learn and use mobility aids as needed. Install an electrical emergency response system.  Turn on lights to avoid dark areas. Replace light bulbs that burn out immediately. Get light switches that glow.  Arrange furniture to create clear pathways. Keep furniture in the same place.  Firmly attach carpet with non-skid or double-sided tape.  Eliminate uneven floor surfaces.  Select a carpet pattern that does not visually hide the edge of steps.  Be aware of all pets. OTHER HOME SAFETY TIPS  Set the water temperature for 120 F (48.8 C).  Keep emergency numbers on or near the telephone.  Keep smoke detectors on every level of the home and near sleeping areas. Document Released: 07/22/2002 Document Revised: 01/31/2012 Document Reviewed: 10/21/2011 [facility] Patient Information 2015 [place], [place]. This information is not intended to replace advice given to you by your health care provider. Make sure you discuss any questions you have with your health care provider.

## 2014-11-19 ENCOUNTER — Other Ambulatory Visit: Payer: Self-pay | Admitting: *Deleted

## 2014-11-19 DIAGNOSIS — L97319 Non-pressure chronic ulcer of right ankle with unspecified severity: Secondary | ICD-10-CM

## 2014-11-27 ENCOUNTER — Ambulatory Visit (INDEPENDENT_AMBULATORY_CARE_PROVIDER_SITE_OTHER): Payer: BC Managed Care – PPO | Admitting: Pharmacist

## 2014-11-27 DIAGNOSIS — Z86718 Personal history of other venous thrombosis and embolism: Secondary | ICD-10-CM

## 2014-11-27 DIAGNOSIS — I82409 Acute embolism and thrombosis of unspecified deep veins of unspecified lower extremity: Secondary | ICD-10-CM

## 2014-11-27 LAB — POCT INR: INR: 3.1

## 2014-11-27 NOTE — Patient Instructions (Signed)
Anticoagulation Dose Instructions as of 11/27/2014      Christina SmilesSun Mon Tue Wed Thu Fri Sat   New Dose 10 mg 5 mg 10 mg 5 mg 10 mg 5 mg 10 mg    Description        Hold today's dose (4/14). Then continue warfarin 10mg  -  1/2 tablet Monday, Wednesdays and Fridays and 1 tablet all other days.

## 2014-12-11 ENCOUNTER — Encounter: Payer: Self-pay | Admitting: Nurse Practitioner

## 2014-12-11 ENCOUNTER — Ambulatory Visit (INDEPENDENT_AMBULATORY_CARE_PROVIDER_SITE_OTHER): Payer: BC Managed Care – PPO | Admitting: Nurse Practitioner

## 2014-12-11 VITALS — BP 122/84 | HR 76 | Temp 97.1°F | Ht 68.0 in | Wt 235.0 lb

## 2014-12-11 DIAGNOSIS — Z23 Encounter for immunization: Secondary | ICD-10-CM

## 2014-12-11 DIAGNOSIS — Z418 Encounter for other procedures for purposes other than remedying health state: Secondary | ICD-10-CM

## 2014-12-11 DIAGNOSIS — I82409 Acute embolism and thrombosis of unspecified deep veins of unspecified lower extremity: Secondary | ICD-10-CM | POA: Diagnosis not present

## 2014-12-11 DIAGNOSIS — Z299 Encounter for prophylactic measures, unspecified: Secondary | ICD-10-CM

## 2014-12-11 DIAGNOSIS — I10 Essential (primary) hypertension: Secondary | ICD-10-CM | POA: Diagnosis not present

## 2014-12-11 DIAGNOSIS — Z6835 Body mass index (BMI) 35.0-35.9, adult: Secondary | ICD-10-CM

## 2014-12-11 LAB — POCT INR: INR: 2

## 2014-12-11 NOTE — Progress Notes (Signed)
  Subjective:    Patient ID: Christina [name], female    DOB: 19-Jan-1951, 64 y.o.   MRN: [id number]  Patient in today for follow up of chronic medical problems. No complaints today.  Hypertension This is a chronic problem. The current episode started more than 1 year ago. The problem is unchanged. The problem is controlled. Pertinent negatives include no headaches or shortness of breath. Risk factors for coronary artery disease include dyslipidemia, family history, obesity, post-menopausal state and sedentary lifestyle. Past treatments include angiotensin blockers, calcium channel blockers and diuretics. The current treatment provides moderate improvement. Compliance problems include diet and exercise.   Hyperlipidemia This is a chronic problem. The current episode started more than 1 year ago. The problem is controlled. Recent lipid tests were reviewed and are normal. Exacerbating diseases include obesity. She has no history of diabetes or hypothyroidism. Pertinent negatives include no myalgias or shortness of breath. Current antihyperlipidemic treatment includes statins. The current treatment provides moderate improvement of lipids. Compliance problems include adherence to diet and adherence to exercise.  Risk factors for coronary artery disease include dyslipidemia, family history, hypertension and obesity.  Hx DVT Currently on coumadin therapy- doing well- no bleeding, no compint of lower ext pain or swelling.   Review of Systems  Constitutional: Negative.   HENT: Negative.   Respiratory: Negative for shortness of breath.   Genitourinary: Negative.   Musculoskeletal: Negative for myalgias.  Neurological: Negative for headaches.  Psychiatric/Behavioral: Negative.   All other systems reviewed and are negative.      Objective:   Physical Exam  Constitutional: She is oriented to person, place, and time. She appears well-developed and well-nourished.  HENT:  Nose: Nose normal.  Mouth/Throat:  Oropharynx is clear and moist.  Eyes: EOM are normal.  Neck: Trachea normal, normal range of motion and full passive range of motion without pain. Neck supple. No JVD present. Carotid bruit is not present. No thyromegaly present.  Cardiovascular: Normal rate, regular rhythm, normal heart sounds and intact distal pulses.  Exam reveals no gallop and no friction rub.   No murmur heard. Pulmonary/Chest: Effort normal and breath sounds normal.  Abdominal: Soft. Bowel sounds are normal. She exhibits no distension and no mass. There is no tenderness.  Musculoskeletal: Normal range of motion.  Lymphadenopathy:    She has no cervical adenopathy.  Neurological: She is alert and oriented to person, place, and time. She has normal reflexes.  Skin: Skin is warm and dry.  Psychiatric: She has a normal mood and affect. Her behavior is normal. Judgment and thought content normal.    BP 122/84 mmHg  Pulse 76  Temp(Src) 97.1 F (36.2 C) (Oral)  Ht $R'5\' 8"'Nh$  (1.727 m)  Wt 235 lb (106.595 kg)  BMI 35.74 kg/m2  Results for orders placed or performed in visit on 12/11/14  POCT INR  Result Value Ref Range   INR 2.0        Assessment & Plan:   1. DVT of leg (deep venous thrombosis) - POCT INR  2. Essential hypertension, benign Do not add salt to diet - CMP14+EGFR - NMR, lipoprofile  3. BMI 35.0-35.9,adult Discussed diet and exercise for person with BMI >25 Will recheck weight in 3-6 months    prevnar 13 today Labs pending Health maintenance reviewed Diet and exercise encouraged Continue all meds Follow up  In 6 months   [place], FNP

## 2014-12-11 NOTE — Patient Instructions (Signed)
Bone Health Our bones do many things. They provide structure, protect organs, anchor muscles, and store calcium. Adequate calcium in your diet and weight-bearing physical activity help build strong bones, improve bone amounts, and may reduce the risk of weakening of bones (osteoporosis) later in life. PEAK BONE MASS By age 66, the average woman has acquired most of her skeletal bone mass. A large decline occurs in older adults which increases the risk of osteoporosis. In women this occurs around the time of menopause. It is important for young girls to reach their peak bone mass in order to maintain bone health throughout life. A person with high bone mass as a young adult will be more likely to have a higher bone mass later in life. Not enough calcium consumption and physical activity early on could result in a failure to achieve optimum bone mass in adulthood. OSTEOPOROSIS Osteoporosis is a disease of the bones. It is defined as low bone mass with deterioration of bone structure. Osteoporosis leads to an increase risk of fractures with falls. These fractures commonly happen in the wrist, hip, and spine. While men and women of all ages and background can develop osteoporosis, some of the risk factors for osteoporosis are:  Female.  White.  Postmenopausal.  Older adults.  Small in body size.  Eating a diet low in calcium.  Physically inactive.  Smoking.  Use of some medications.  Family history. CALCIUM Calcium is a mineral needed by the body for healthy bones, teeth, and proper function of the heart, muscles, and nerves. The body cannot produce calcium so it must be absorbed through food. Good sources of calcium include:  Dairy products (low fat or nonfat milk, cheese, and yogurt).  Dark green leafy vegetables (bok choy and broccoli).  Calcium fortified foods (orange juice, cereal, bread, soy beverages, and tofu products).  Nuts (almonds). Recommended amounts of calcium vary  for individuals. RECOMMENDED CALCIUM INTAKES Age and Amount in mg per day  Children 1 to 3 years / 700 mg  Children 4 to 8 years / 1,000 mg  Children 9 to 13 years / 1,300 mg  Teens 14 to 18 years / 1,300 mg  Adults 19 to 50 years / 1,000 mg  Adult women 51 to 70 years / 1,200 mg  Adults 71 years and older / 1,200 mg  Pregnant and breastfeeding teens / 1,300 mg  Pregnant and breastfeeding adults / 1,000 mg Vitamin D also plays an important role in healthy bone development. Vitamin D helps in the absorption of calcium. WEIGHT-BEARING PHYSICAL ACTIVITY Regular physical activity has many positive health benefits. Benefits include strong bones. Weight-bearing physical activity early in life is important in reaching peak bone mass. Weight-bearing physical activities cause muscles and bones to work against gravity. Some examples of weight bearing physical activities include:  Walking, jogging, or running.  Field Hockey.  Jumping rope.  Dancing.  Soccer.  Tennis or Racquetball.  Stair climbing.  Basketball.  Hiking.  Weight lifting.  Aerobic fitness classes. Including weight-bearing physical activity into an exercise plan is a great way to keep bones healthy. Adults: Engage in at least 30 minutes of moderate physical activity on most, preferably all, days of the week. Children: Engage in at least 60 minutes of moderate physical activity on most, preferably all, days of the week. FOR MORE INFORMATION [place] Department of Agriculture, Center for Nutrition Policy and Promotion: www.cnpp.usda.gov National Osteoporosis Foundation: www.nof.org Document Released: 10/22/2003 Document Revised: 11/26/2012 Document Reviewed: 01/21/2009 [employer] Patient Information   2015 [employer]. This information is not intended to replace advice given to you by your health care provider. Make sure you discuss any questions you have with your health care provider.  

## 2014-12-11 NOTE — Addendum Note (Signed)
Addended by: Almeta MonasSTONE, Sheronica Corey M on: 12/11/2014 04:11 PM   Modules accepted: Orders

## 2014-12-12 ENCOUNTER — Other Ambulatory Visit: Payer: Self-pay | Admitting: Family

## 2014-12-12 LAB — CMP14+EGFR
ALK PHOS: 103 IU/L (ref 39–117)
ALT: 30 IU/L (ref 0–32)
AST: 27 IU/L (ref 0–40)
Albumin/Globulin Ratio: 1.5 (ref 1.1–2.5)
Albumin: 4.2 g/dL (ref 3.6–4.8)
BUN / CREAT RATIO: 21 (ref 11–26)
BUN: 23 mg/dL (ref 8–27)
Bilirubin Total: 0.3 mg/dL (ref 0.0–1.2)
CHLORIDE: 103 mmol/L (ref 97–108)
CO2: 24 mmol/L (ref 18–29)
Calcium: 9.4 mg/dL (ref 8.7–10.3)
Creatinine, Ser: 1.1 mg/dL — ABNORMAL HIGH (ref 0.57–1.00)
GFR calc non Af Amer: 53 mL/min/{1.73_m2} — ABNORMAL LOW (ref >59)
GFR, EST AFRICAN AMERICAN: 61 mL/min/{1.73_m2} (ref >59)
Globulin, Total: 2.8 g/dL (ref 1.5–4.5)
Glucose: 83 mg/dL (ref 65–99)
POTASSIUM: 4.5 mmol/L (ref 3.5–5.2)
SODIUM: 142 mmol/L (ref 134–144)
TOTAL PROTEIN: 7 g/dL (ref 6.0–8.5)

## 2014-12-12 LAB — NMR, LIPOPROFILE
Cholesterol: 183 mg/dL (ref 100–199)
HDL Cholesterol by NMR: 61 mg/dL (ref >39)
HDL Particle Number: 35.4 umol/L (ref >=30.5)
LDL Particle Number: 1058 nmol/L — ABNORMAL HIGH (ref <1000)
LDL SIZE: 21.1 nm (ref >20.5)
LDL-C: 99 mg/dL (ref 0–99)
SMALL LDL PARTICLE NUMBER: 353 nmol/L (ref <=527)
Triglycerides by NMR: 116 mg/dL (ref 0–149)

## 2014-12-19 ENCOUNTER — Encounter: Payer: Self-pay | Admitting: Surgery

## 2014-12-22 ENCOUNTER — Ambulatory Visit (HOSPITAL_COMMUNITY)
Admission: RE | Admit: 2014-12-22 | Discharge: 2014-12-22 | Disposition: A | Discharge status: Home / Self Care | Payer: BC Managed Care – PPO | Source: Ambulatory Visit | Attending: Surgery | Admitting: Surgery

## 2014-12-22 ENCOUNTER — Encounter: Payer: Self-pay | Admitting: Surgery

## 2014-12-22 ENCOUNTER — Ambulatory Visit (INDEPENDENT_AMBULATORY_CARE_PROVIDER_SITE_OTHER): Payer: BC Managed Care – PPO | Admitting: Surgery

## 2014-12-22 VITALS — BP 136/80 | HR 73 | Ht 68.0 in | Wt 230.0 lb

## 2014-12-22 DIAGNOSIS — L97319 Non-pressure chronic ulcer of right ankle with unspecified severity: Secondary | ICD-10-CM | POA: Diagnosis present

## 2014-12-22 DIAGNOSIS — I872 Venous insufficiency (chronic) (peripheral): Secondary | ICD-10-CM | POA: Diagnosis not present

## 2014-12-22 DIAGNOSIS — I82431 Acute embolism and thrombosis of right popliteal vein: Secondary | ICD-10-CM | POA: Insufficient documentation

## 2014-12-22 DIAGNOSIS — I82411 Acute embolism and thrombosis of right femoral vein: Secondary | ICD-10-CM | POA: Diagnosis not present

## 2014-12-22 NOTE — Progress Notes (Signed)
Patient name: Christina [name] MRN: [id number] DOB: [birth year] Sex: female   Referred by: Dr. Tanda RockersNichols  Reason for referral:  Chief Complaint  Patient presents with  . New Evaluation    recurring Rt ankle statsis ulcer     HISTORY OF PRESENT ILLNESS: The patient is here today for evaluation of a recurrent right leg venous stasis ulcer.  Her first ulcer happened in the 1990s.  Her second has been present for several months.  She did have artificial skin substitute placed in the wound has nearly healed.  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  [facility] with some form of a coagulopathy and has been on Coumadin which has been recommended that she continue for lifelong therapy.  She recently started wearing compression stockings proximally 4-6 weeks ago.  The patient is medically managed for hypertension.  She takes a statin for hypercholesterolemia.  She is a former smoker but quit many many years ago.  Past Medical History  Diagnosis Date  . DVT (deep venous thrombosis) 1990's  . Pulmonary embolism 1990's  . Epicondylitis     right   . Allergic rhinitis   . Hyperlipidemia   . GERD (gastroesophageal reflux disease)   . Clotting disorder   . DVT (deep venous thrombosis)     Past Surgical History  Procedure Laterality Date  . Tubal ligation      Multiple  . Cystectomy      From shoulder and neck    History   Social History  . Marital Status: Married    Spouse Name: N/A  . Number of Children: 5  . Years of Education: N/A   Occupational History  . Not on file.   Social History Main Topics  . Smoking status: Former Smoker    Quit date: 08/16/1979  . Smokeless tobacco: Not on file     Comment: x33 years  . Alcohol Use: No  . Drug Use: No  . Sexual Activity: Not on file   Other Topics Concern  . Not on file   Social History Narrative   Married.    Family History  Problem Relation Age  of Onset  . Heart attack Mother   . Deep vein thrombosis Mother   . Diabetes Mother   . Heart disease Mother   . Hyperlipidemia Mother   . Other Neg Hx     Clotting disorder  . Cancer Father   . Hyperlipidemia Sister   . Deep vein thrombosis Daughter     Allergies as of 12/22/2014 - Review Complete 12/22/2014  Allergen Reaction Noted  . Ace inhibitors  12/30/2010    Current Outpatient Prescriptions on File Prior to Visit  Medication Sig Dispense Refill  . atorvastatin (LIPITOR) 40 MG tablet Take 1 tablet (40 mg total) by mouth daily. 30 tablet 5  . Cholecalciferol (VITAMIN D-3) 5000 UNITS TABS Take 1 capsule by mouth daily.    . mupirocin cream (BACTROBAN) 2 % Apply 1 application topically 2 (two) times daily. 15 g 0  . Olmesartan-Amlodipine-HCTZ (TRIBENZOR) 40-5-12.5 MG TABS Take 1 tablet by mouth daily. 30 tablet 5  . triamcinolone cream (KENALOG) 0.1 %   0  . warfarin (COUMADIN) 10 MG tablet TAKE 1 TABLET BY MOUTH EVERY DAY 30 tablet 5   No current facility-administered medications on file prior to visit.     REVIEW OF SYSTEMS: Cardiovascular: No chest pain, chest pressure, positive  for pain in legs when walking.  Positive for history of DVT and leg swelling.  Positive for varicose veins Pulmonary: No productive cough, asthma or wheezing. Neurologic: No weakness, paresthesias, aphasia, or amaurosis. No dizziness. Hematologic: History of clotting disorder on lifelong anticoagulation Musculoskeletal: No joint pain or joint swelling. Gastrointestinal: No blood in stool or hematemesis Genitourinary: No dysuria or hematuria. Psychiatric:: No history of major depression. Integumentary: No rashes or ulcers. Constitutional: No fever or chills.  PHYSICAL EXAMINATION:  Filed Vitals:   12/22/14 1131  BP: 136/80  Pulse: 73  Height: 5\' 8"  (1.727 m)  Weight: 230 lb (104.327 kg)  SpO2: 100%   Body mass index is 34.98 kg/(m^2). General: The patient appears their stated age.     HEENT:  No gross abnormalities Pulmonary: Respirations are non-labored Musculoskeletal: There are no major deformities.   Neurologic: No focal weakness or paresthesias are detected, Skin: Almost healed wound on the right medial ankle. Psychiatric: The patient has normal affect. Cardiovascular: Palpable pedal pulses.  Right leg edema with hyperpigmentation in the ankle area  Diagnostic Studies: Venous reflux studies were ordered which I have reviewed.  She has deep vein reflux as well as residual thrombus within the distal femoral and popliteal vein.  There is no reflux within the right great saphenous vein.  The small saphenous does show reflux throughout with a maximum diameter 4.4 mm    Assessment:  Venous insufficiency Plan: The patient does have venous insufficiency on the right leg which has been affected by blood clots in the past.  She has also had 2 episodes of ulceration, the most recent ulcer has almost completely healed.  There is reflux in the small saphenous vein.  Residual thrombus is also identified within the femoral and popliteal vein on the right.  I discussed with the patient that with thrombus in the deep vein as well as her coagulopathy, despite having reflux in the short saphenous vein on the right, I would not favor laser ablation at this time.  She has only been wearing stockings for approximately one month.  I have recommended that she continue with compression therapy.  If she were to develop a third ulcer while on compression therapy, we could consider laser ablation of the small saphenous vein.  She will contact me if she has another ulcer.     Jorge NyV. Wells Brabham IV, M.D. [facility] Office: [phone] Pager:  [phone]

## 2014-12-25 ENCOUNTER — Ambulatory Visit (INDEPENDENT_AMBULATORY_CARE_PROVIDER_SITE_OTHER): Payer: BC Managed Care – PPO | Admitting: Pharmacist

## 2014-12-25 DIAGNOSIS — I82409 Acute embolism and thrombosis of unspecified deep veins of unspecified lower extremity: Secondary | ICD-10-CM | POA: Diagnosis not present

## 2014-12-25 DIAGNOSIS — Z86718 Personal history of other venous thrombosis and embolism: Secondary | ICD-10-CM

## 2014-12-25 LAB — POCT INR: INR: 1.7

## 2014-12-25 NOTE — Patient Instructions (Signed)
Anticoagulation Dose Instructions as of 12/25/2014      Christina SmilesSun Mon Tue Wed Thu Fri Sat   New Dose 10 mg 5 mg 10 mg 5 mg 10 mg 5 mg 10 mg    Description        Take an extra 1/2 tablet today - thrusday, May 12th.  Then continue current dose of 1/2 tablet mondays, wednesdays and fridays and 1 tablet all other days.       INR was 1.7 today

## 2015-02-12 ENCOUNTER — Ambulatory Visit (INDEPENDENT_AMBULATORY_CARE_PROVIDER_SITE_OTHER): Payer: BC Managed Care – PPO | Admitting: Pharmacist

## 2015-02-12 DIAGNOSIS — I82409 Acute embolism and thrombosis of unspecified deep veins of unspecified lower extremity: Secondary | ICD-10-CM

## 2015-02-12 DIAGNOSIS — Z86718 Personal history of other venous thrombosis and embolism: Secondary | ICD-10-CM

## 2015-02-12 LAB — POCT INR: INR: 4.6

## 2015-02-12 NOTE — Patient Instructions (Addendum)
Anticoagulation Dose Instructions as of 02/12/2015      Christina SmilesSun Mon Tue Wed Thu Fri Sat   New Dose 10 mg 5 mg 10 mg 5 mg 10 mg 5 mg 5 mg    Description        No warfarin tomorrow - Friday, July 1st, then restart warfarin 10mg  - take 1/2 tablet mondays, wednesdays, fridays and saturdays.  Take 1 tablet all other days.  Restart eating 1 to 2 servings of greens weekly.      INR was 4.5 today

## 2015-03-05 ENCOUNTER — Ambulatory Visit (INDEPENDENT_AMBULATORY_CARE_PROVIDER_SITE_OTHER): Payer: BC Managed Care – PPO | Admitting: Pharmacist

## 2015-03-05 DIAGNOSIS — Z86718 Personal history of other venous thrombosis and embolism: Secondary | ICD-10-CM

## 2015-03-05 DIAGNOSIS — I82409 Acute embolism and thrombosis of unspecified deep veins of unspecified lower extremity: Secondary | ICD-10-CM | POA: Diagnosis not present

## 2015-03-05 LAB — POCT INR: INR: 2.2

## 2015-03-05 NOTE — Patient Instructions (Signed)
Anticoagulation Dose Instructions as of 03/05/2015      Glynis Smiles Tue Wed Thu Fri Sat   New Dose 10 mg 5 mg 10 mg 5 mg 10 mg 5 mg 5 mg    Description        Continue warfarin  - take 1/2 tablet mondays, wednesdays, fridays and saturdays.  Take 1 tablet all other days.  Continue to eat 1 to 2 servings of greens weekly.      INR was 2.2 today

## 2015-04-16 ENCOUNTER — Ambulatory Visit (INDEPENDENT_AMBULATORY_CARE_PROVIDER_SITE_OTHER): Payer: BC Managed Care – PPO | Admitting: Pharmacist

## 2015-04-16 DIAGNOSIS — I82409 Acute embolism and thrombosis of unspecified deep veins of unspecified lower extremity: Secondary | ICD-10-CM

## 2015-04-16 DIAGNOSIS — Z86718 Personal history of other venous thrombosis and embolism: Secondary | ICD-10-CM | POA: Diagnosis not present

## 2015-04-16 LAB — POCT INR: INR: 4.5

## 2015-04-16 NOTE — Patient Instructions (Signed)
Anticoagulation Dose Instructions as of 04/16/2015      Christina [name] Tue Wed Thu Fri Sat   New Dose 5 mg 10 mg 5 mg 5 mg 5 mg 10 mg 5 mg    Description        No warfarin tomorrow (friday, September 2nd) and Saturday, September 3rd) Then start lower dose of 1 tablet on mondays and fridays and 1/2 tablet all other days Continue to eat 1 to 2 servings of greens weekly.      INR was 4.5 today

## 2015-04-23 ENCOUNTER — Ambulatory Visit (INDEPENDENT_AMBULATORY_CARE_PROVIDER_SITE_OTHER): Payer: BC Managed Care – PPO | Admitting: Nurse Practitioner

## 2015-04-23 ENCOUNTER — Encounter: Payer: Self-pay | Admitting: Nurse Practitioner

## 2015-04-23 DIAGNOSIS — I82401 Acute embolism and thrombosis of unspecified deep veins of right lower extremity: Secondary | ICD-10-CM | POA: Diagnosis not present

## 2015-04-23 DIAGNOSIS — I10 Essential (primary) hypertension: Secondary | ICD-10-CM | POA: Diagnosis not present

## 2015-04-23 DIAGNOSIS — I82409 Acute embolism and thrombosis of unspecified deep veins of unspecified lower extremity: Secondary | ICD-10-CM | POA: Diagnosis not present

## 2015-04-23 LAB — POCT INR: INR: 1.2

## 2015-04-23 NOTE — Progress Notes (Signed)
Patient in today for DMV form and INR

## 2015-04-23 NOTE — Patient Instructions (Signed)
Anticoagulation Dose Instructions as of 04/23/2015      Christina [name] Tue Wed Thu Fri Sat   New Dose 10 mg 5 mg 10 mg 5 mg 10 mg 10 mg 10 mg    Description        recheck monday

## 2015-04-29 ENCOUNTER — Telehealth: Payer: Self-pay | Admitting: Pharmacist

## 2015-04-29 NOTE — Telephone Encounter (Signed)
Patient was given appt time and how to take warfarin until next appt - warfarin dose of  MWF and  all other days.

## 2015-05-11 ENCOUNTER — Ambulatory Visit (INDEPENDENT_AMBULATORY_CARE_PROVIDER_SITE_OTHER): Payer: BC Managed Care – PPO | Admitting: Pharmacist

## 2015-05-11 DIAGNOSIS — I82409 Acute embolism and thrombosis of unspecified deep veins of unspecified lower extremity: Secondary | ICD-10-CM | POA: Diagnosis not present

## 2015-05-11 DIAGNOSIS — Z86718 Personal history of other venous thrombosis and embolism: Secondary | ICD-10-CM

## 2015-05-11 LAB — POCT INR: INR: 3.2

## 2015-05-11 NOTE — Patient Instructions (Signed)
Anticoagulation Dose Instructions as of 05/11/2015      Christina [name] Tue Wed Thu Fri Sat   New Dose 5 mg 10 mg 5 mg 10 mg 5 mg 10 mg 5 mg    Description        No warfarin tomorrow - Tuesday, September 27th.  Then decrease dose to 1 tablet mondays, wednesdays and fridays.  Take 1/2 tablet all other days.      INR was 3.2 today

## 2015-05-21 ENCOUNTER — Encounter: Payer: Self-pay | Admitting: Pharmacist

## 2015-05-28 ENCOUNTER — Ambulatory Visit (INDEPENDENT_AMBULATORY_CARE_PROVIDER_SITE_OTHER): Payer: BC Managed Care – PPO | Admitting: Pharmacist

## 2015-05-28 DIAGNOSIS — Z23 Encounter for immunization: Secondary | ICD-10-CM | POA: Diagnosis not present

## 2015-05-28 DIAGNOSIS — I82409 Acute embolism and thrombosis of unspecified deep veins of unspecified lower extremity: Secondary | ICD-10-CM

## 2015-05-28 DIAGNOSIS — Z86718 Personal history of other venous thrombosis and embolism: Secondary | ICD-10-CM

## 2015-05-28 LAB — POCT INR: INR: 2

## 2015-06-10 ENCOUNTER — Telehealth: Payer: Self-pay | Admitting: Nurse Practitioner

## 2015-06-11 ENCOUNTER — Other Ambulatory Visit: Payer: Self-pay | Admitting: Nurse Practitioner

## 2015-07-02 ENCOUNTER — Ambulatory Visit (INDEPENDENT_AMBULATORY_CARE_PROVIDER_SITE_OTHER): Payer: BC Managed Care – PPO | Admitting: Pharmacist

## 2015-07-02 ENCOUNTER — Encounter: Payer: Self-pay | Admitting: Pharmacist

## 2015-07-02 VITALS — BP 118/84 | HR 76

## 2015-07-02 DIAGNOSIS — Z86718 Personal history of other venous thrombosis and embolism: Secondary | ICD-10-CM | POA: Diagnosis not present

## 2015-07-02 DIAGNOSIS — I82409 Acute embolism and thrombosis of unspecified deep veins of unspecified lower extremity: Secondary | ICD-10-CM | POA: Diagnosis not present

## 2015-07-02 LAB — POCT INR: INR: 2.4

## 2015-07-02 NOTE — Patient Instructions (Signed)
Anticoagulation Dose Instructions as of 07/02/2015      Christina SmilesSun Mon Tue Wed Thu Fri Sat   New Dose 5 mg 10 mg 5 mg 10 mg 5 mg 10 mg 5 mg    Description        Continue warfarin 10mg  - take 1 tablet mondays, wednesdays and fridays.  Take 1/2 tablet all other days.

## 2015-07-13 ENCOUNTER — Other Ambulatory Visit: Payer: Self-pay | Admitting: Nurse Practitioner

## 2015-07-30 ENCOUNTER — Other Ambulatory Visit: Payer: Self-pay | Admitting: Nurse Practitioner

## 2015-07-30 ENCOUNTER — Ambulatory Visit (INDEPENDENT_AMBULATORY_CARE_PROVIDER_SITE_OTHER): Payer: BC Managed Care – PPO | Admitting: Pharmacist

## 2015-07-30 VITALS — BP 138/68 | HR 92

## 2015-07-30 DIAGNOSIS — I82409 Acute embolism and thrombosis of unspecified deep veins of unspecified lower extremity: Secondary | ICD-10-CM

## 2015-07-30 DIAGNOSIS — Z86718 Personal history of other venous thrombosis and embolism: Secondary | ICD-10-CM | POA: Diagnosis not present

## 2015-07-30 LAB — POCT INR: INR: 2.6

## 2015-07-30 NOTE — Patient Instructions (Signed)
Anticoagulation Dose Instructions as of 07/30/2015      Glynis SmilesSun Mon Tue Wed Thu Fri Sat   New Dose 5 mg 10 mg 5 mg 10 mg 5 mg 10 mg 5 mg    Description        Continue warfarin 10mg  - take 1 tablet mondays, wednesdays and fridays.  Take 1/2 tablet all other days.      INR was 2.6 today

## 2015-07-30 NOTE — Telephone Encounter (Signed)
Last seen 04/23/15 MMM  Last lipid 12/11/14

## 2015-07-30 NOTE — Telephone Encounter (Signed)
Last refill without being seen 

## 2015-07-31 NOTE — Telephone Encounter (Signed)
Detailed message left for patient that she will need to be seen for any further refills. 

## 2015-08-31 ENCOUNTER — Telehealth: Payer: Self-pay | Admitting: Nurse Practitioner

## 2015-09-10 ENCOUNTER — Encounter: Payer: Self-pay | Admitting: Pharmacist

## 2015-09-11 ENCOUNTER — Encounter: Payer: Self-pay | Admitting: Nurse Practitioner

## 2015-09-12 ENCOUNTER — Other Ambulatory Visit: Payer: Self-pay | Admitting: Nurse Practitioner

## 2015-09-14 NOTE — Telephone Encounter (Signed)
Last refill without being seen 

## 2015-09-14 NOTE — Telephone Encounter (Signed)
Attempted to contact patient several times this encounter will be closed.

## 2015-09-14 NOTE — Telephone Encounter (Signed)
Left detailed message that requested rx's had been refilled but ntbs for any further refills, to call and schedule a regular follow up appt with MMM.

## 2015-09-14 NOTE — Telephone Encounter (Signed)
Last seen 05/13/15  MMM Last lipid 12/11/14

## 2015-09-18 ENCOUNTER — Ambulatory Visit (INDEPENDENT_AMBULATORY_CARE_PROVIDER_SITE_OTHER): Payer: BC Managed Care – PPO | Admitting: Pharmacist

## 2015-09-18 DIAGNOSIS — I82409 Acute embolism and thrombosis of unspecified deep veins of unspecified lower extremity: Secondary | ICD-10-CM

## 2015-09-18 DIAGNOSIS — Z86718 Personal history of other venous thrombosis and embolism: Secondary | ICD-10-CM

## 2015-09-18 LAB — POCT INR: INR: 2.5

## 2015-09-18 NOTE — Patient Instructions (Signed)
Anticoagulation Dose Instructions as of 09/18/2015      Christina [name] Tue Wed Thu Fri Sat   New Dose 5 mg 10 mg 5 mg 10 mg 5 mg 10 mg 5 mg    Description        Continue warfarin  - take 1 tablet mondays, wednesdays and fridays.  Take 1/2 tablet all other days.      INR was 2.5

## 2015-09-29 ENCOUNTER — Ambulatory Visit (INDEPENDENT_AMBULATORY_CARE_PROVIDER_SITE_OTHER): Payer: BC Managed Care – PPO | Admitting: Nurse Practitioner

## 2015-09-29 ENCOUNTER — Encounter: Payer: Self-pay | Admitting: Nurse Practitioner

## 2015-09-29 VITALS — BP 119/81 | HR 89 | Temp 97.2°F | Ht 68.0 in | Wt 235.0 lb

## 2015-09-29 DIAGNOSIS — M79675 Pain in left toe(s): Secondary | ICD-10-CM

## 2015-09-29 NOTE — Progress Notes (Signed)
   Subjective:    Patient ID: Charlane Westry, female    DOB: 03-03-1951, 65 y.o.   MRN: [id number]  HPI Patient in today c/o left big toe pain- started about about 2 weeks ago- pain worse with walking. Se went and bought a spacer to put between toe and 2nd toe and today it is better. No edema or erythema.    Review of Systems  Constitutional: Negative.   HENT: Negative.   Respiratory: Negative.   Cardiovascular: Negative.   Genitourinary: Negative.   Neurological: Negative.   Psychiatric/Behavioral: Negative.   All other systems reviewed and are negative.      Objective:   Physical Exam  Constitutional: She is oriented to person, place, and time. She appears well-developed and well-nourished.  Cardiovascular: Normal rate and normal heart sounds.   Pulmonary/Chest: Effort normal and breath sounds normal.  Musculoskeletal:  Left first toe is deviated laterally and sits slightly under 2nd toe. No erythema, edema or tenderness  Neurological: She is alert and oriented to person, place, and time.  Skin: Skin is warm.  Psychiatric: She has a normal mood and affect. Her behavior is normal. Judgment and thought content normal.   BP 119/81 mmHg  Pulse 89  Temp(Src) 97.2 F (36.2 C) (Oral)  Ht  (1.727 m)  Wt 235 lb (106.595 kg)  BMI 35.74 kg/m2        Assessment & Plan:   1. Pain of toe of left foot    Wear spacer Do not pick at toe RTO prn  Mary-Margaret Daphine Deutscher, FNP

## 2015-10-16 ENCOUNTER — Other Ambulatory Visit: Payer: Self-pay | Admitting: Nurse Practitioner

## 2015-10-19 NOTE — Telephone Encounter (Signed)
Last lipids 11/2014

## 2015-10-19 NOTE — Telephone Encounter (Signed)
Last refill without being seen 

## 2015-10-29 ENCOUNTER — Ambulatory Visit (INDEPENDENT_AMBULATORY_CARE_PROVIDER_SITE_OTHER): Payer: BC Managed Care – PPO | Admitting: Pharmacist

## 2015-10-29 DIAGNOSIS — Z86718 Personal history of other venous thrombosis and embolism: Secondary | ICD-10-CM

## 2015-10-29 DIAGNOSIS — Z7901 Long term (current) use of anticoagulants: Secondary | ICD-10-CM

## 2015-10-29 LAB — COAGUCHEK XS/INR WAIVED
INR: 2.4 — AB (ref 0.9–1.1)
PROTHROMBIN TIME: 29.4 s

## 2015-10-29 NOTE — Patient Instructions (Addendum)
Anticoagulation Dose Instructions as of 10/29/2015      Christina SmilesSun Mon Tue Wed Thu Fri Sat   New Dose 5 mg 10 mg 5 mg 10 mg 5 mg 10 mg 5 mg    Description        Continue warfarin 10mg  - take 1 tablet mondays, wednesdays and fridays.  Take 1/2 tablet all other days.      INR was 2.4 today

## 2015-11-14 ENCOUNTER — Other Ambulatory Visit: Payer: Self-pay | Admitting: Nurse Practitioner

## 2015-11-16 NOTE — Telephone Encounter (Signed)
Last refill without being seen 

## 2015-11-16 NOTE — Telephone Encounter (Signed)
Last seen 09/29/15  MMM  Last lipid 12/11/14

## 2015-11-16 NOTE — Telephone Encounter (Signed)
Patient has appointment 4/10

## 2015-11-20 ENCOUNTER — Other Ambulatory Visit: Payer: Self-pay | Admitting: *Deleted

## 2015-11-20 MED ORDER — TRIBENZOR 40-5-12.5 MG PO TABS: 1.0000 | ORAL_TABLET | Freq: Every day | ORAL | Status: DC

## 2015-11-21 ENCOUNTER — Other Ambulatory Visit: Payer: Self-pay | Admitting: Nurse Practitioner

## 2015-11-23 ENCOUNTER — Encounter: Payer: Self-pay | Admitting: *Deleted

## 2015-11-23 ENCOUNTER — Encounter (INDEPENDENT_AMBULATORY_CARE_PROVIDER_SITE_OTHER): Payer: Self-pay

## 2015-11-23 ENCOUNTER — Ambulatory Visit (INDEPENDENT_AMBULATORY_CARE_PROVIDER_SITE_OTHER): Payer: BC Managed Care – PPO | Admitting: Nurse Practitioner

## 2015-11-23 ENCOUNTER — Encounter: Payer: Self-pay | Admitting: Nurse Practitioner

## 2015-11-23 VITALS — BP 108/72 | HR 73 | Temp 97.3°F | Ht 68.0 in | Wt 231.0 lb

## 2015-11-23 DIAGNOSIS — E785 Hyperlipidemia, unspecified: Secondary | ICD-10-CM

## 2015-11-23 DIAGNOSIS — Z6835 Body mass index (BMI) 35.0-35.9, adult: Secondary | ICD-10-CM | POA: Diagnosis not present

## 2015-11-23 DIAGNOSIS — Z86718 Personal history of other venous thrombosis and embolism: Secondary | ICD-10-CM

## 2015-11-23 DIAGNOSIS — I10 Essential (primary) hypertension: Secondary | ICD-10-CM | POA: Diagnosis not present

## 2015-11-23 DIAGNOSIS — Z7901 Long term (current) use of anticoagulants: Secondary | ICD-10-CM | POA: Diagnosis not present

## 2015-11-23 LAB — PROTIME-INR: INR: 2 — AB (ref <=1.1)

## 2015-11-23 LAB — COAGUCHEK XS/INR WAIVED
INR: 2 — ABNORMAL HIGH (ref 0.9–1.1)
Prothrombin Time: 23.5 s

## 2015-11-23 MED ORDER — ATORVASTATIN CALCIUM 40 MG PO TABS: ORAL_TABLET | ORAL | Status: DC

## 2015-11-23 MED ORDER — WARFARIN SODIUM 10 MG PO TABS: 10.0000 mg | ORAL_TABLET | Freq: Every day | ORAL | Status: DC

## 2015-11-23 NOTE — Telephone Encounter (Signed)
Last seen 09/29/15  MMM  Last lipid 12/11/14 

## 2015-11-23 NOTE — Progress Notes (Signed)
Subjective:    Patient ID: Christina [name], female    DOB: 07/19/1951, 65 y.o.   MRN: [id number]  Patient here today for follow up of chronic medical problems.  Outpatient Encounter Prescriptions as of 11/23/2015  Medication Sig  . atorvastatin (LIPITOR) 40 MG tablet TAKE 1 TABLET (40 MG TOTAL) BY MOUTH DAILY.  Marland Kitchen Cholecalciferol (VITAMIN D-3) 5000 UNITS TABS Take 1 capsule by mouth daily.  . mupirocin cream (BACTROBAN) 2 % Apply 1 application topically 2 (two) times daily.  Jabier Gauss 40-5-12.5 MG TABS Take 1 tablet by mouth daily.  Marland Kitchen warfarin (COUMADIN) 10 MG tablet TAKE 1 TABLET BY MOUTH EVERY DAY   No facility-administered encounter medications on file as of 11/23/2015.     Hypertension This is a chronic problem. The current episode started more than 1 year ago. The problem is unchanged. The problem is controlled. Pertinent negatives include no headaches or shortness of breath. Risk factors for coronary artery disease include dyslipidemia, family history, obesity, post-menopausal state and sedentary lifestyle. Past treatments include angiotensin blockers, calcium channel blockers and diuretics. The current treatment provides moderate improvement. Compliance problems include diet and exercise.   Hyperlipidemia This is a chronic problem. The current episode started more than 1 year ago. The problem is controlled. Recent lipid tests were reviewed and are normal. Exacerbating diseases include obesity. She has no history of diabetes or hypothyroidism. Pertinent negatives include no myalgias or shortness of breath. Current antihyperlipidemic treatment includes statins. The current treatment provides moderate improvement of lipids. Compliance problems include adherence to diet and adherence to exercise.  Risk factors for coronary artery disease include dyslipidemia, family history, hypertension and obesity.  Hx DVT Currently on coumadin therapy- doing well- no bleeding, no compint of lower ext pain  or swelling.   Review of Systems  Constitutional: Negative.   HENT: Negative.   Respiratory: Negative for shortness of breath.   Genitourinary: Negative.   Musculoskeletal: Negative for myalgias.  Neurological: Negative for headaches.  Psychiatric/Behavioral: Negative.   All other systems reviewed and are negative.      Objective:   Physical Exam  Constitutional: She is oriented to person, place, and time. She appears well-developed and well-nourished.  HENT:  Nose: Nose normal.  Mouth/Throat: Oropharynx is clear and moist.  Eyes: EOM are normal.  Neck: Trachea normal, normal range of motion and full passive range of motion without pain. Neck supple. No JVD present. Carotid bruit is not present. No thyromegaly present.  Cardiovascular: Normal rate, regular rhythm, normal heart sounds and intact distal pulses.  Exam reveals no gallop and no friction rub.   No murmur heard. Pulmonary/Chest: Effort normal and breath sounds normal.  Abdominal: Soft. Bowel sounds are normal. She exhibits no distension and no mass. There is no tenderness.  Musculoskeletal: Normal range of motion.  Lymphadenopathy:    She has no cervical adenopathy.  Neurological: She is alert and oriented to person, place, and time. She has normal reflexes.  Skin: Skin is warm and dry.  Psychiatric: She has a normal mood and affect. Her behavior is normal. Judgment and thought content normal.    BP 108/72 mmHg  Pulse 73  Temp(Src) 97.3 F (36.3 C) (Oral)  Ht 5' 8" (1.727 m)  Wt 231 lb (104.781 kg)  BMI 35.13 kg/m2      Assessment & Plan:   1. History of DVT of lower extremity - CoaguChek XS/INR Waived  2. Chronic anticoagulation Recheck in 1 month - CoaguChek XS/INR Waived  3. Essential  hypertension, benign - warfarin (COUMADIN) 10 MG tablet; Take 1 tablet (10 mg total) by mouth daily.  Dispense: 30 tablet; Refill: 5 - CMP14+EGFR  4. Hyperlipidemia with target LDL less than 100 Low fat diet -  atorvastatin (LIPITOR) 40 MG tablet; TAKE 1 TABLET (40 MG TOTAL) BY MOUTH DAILY.  Dispense: 30 tablet; Refill: 5 - Lipid panel  5. BMI 35.0-35.9,adult Discussed diet and exercise for person with BMI >25 Will recheck weight in 3-6 months  6. On anticoagulant therapy    Labs pending Health maintenance reviewed Diet and exercise encouraged Continue all meds Follow up  In 6 months   [place], FNP

## 2015-11-23 NOTE — Patient Instructions (Signed)
Anticoagulation Dose Instructions as of 11/23/2015      Glynis SmilesSun Mon Tue Wed Thu Fri Sat   New Dose 5 mg 10 mg 5 mg 10 mg 5 mg 10 mg 5 mg    Description        Continue warfarin 10mg  - take 1 tablet mondays, wednesdays and fridays.  Take 1/2 tablet all other days.      Recheck in 1 month

## 2015-11-24 LAB — CMP14+EGFR
A/G RATIO: 1.6 (ref 1.2–2.2)
ALT: 17 IU/L (ref 0–32)
AST: 18 IU/L (ref 0–40)
Albumin: 4.1 g/dL (ref 3.6–4.8)
Alkaline Phosphatase: 98 IU/L (ref 39–117)
BUN/Creatinine Ratio: 17 (ref 12–28)
BUN: 16 mg/dL (ref 8–27)
Bilirubin Total: 0.3 mg/dL (ref 0.0–1.2)
CALCIUM: 9.6 mg/dL (ref 8.7–10.3)
CO2: 23 mmol/L (ref 18–29)
CREATININE: 0.96 mg/dL (ref 0.57–1.00)
Chloride: 104 mmol/L (ref 96–106)
GFR calc Af Amer: 71 mL/min/{1.73_m2} (ref >59)
GFR, EST NON AFRICAN AMERICAN: 62 mL/min/{1.73_m2} (ref >59)
GLOBULIN, TOTAL: 2.6 g/dL (ref 1.5–4.5)
Glucose: 95 mg/dL (ref 65–99)
Potassium: 4.1 mmol/L (ref 3.5–5.2)
Sodium: 144 mmol/L (ref 134–144)
TOTAL PROTEIN: 6.7 g/dL (ref 6.0–8.5)

## 2015-11-24 LAB — LIPID PANEL
CHOL/HDL RATIO: 2.8 ratio (ref 0.0–4.4)
Cholesterol, Total: 159 mg/dL (ref 100–199)
HDL: 56 mg/dL (ref >39)
LDL CALC: 85 mg/dL (ref 0–99)
TRIGLYCERIDES: 92 mg/dL (ref 0–149)
VLDL CHOLESTEROL CAL: 18 mg/dL (ref 5–40)

## 2015-11-25 ENCOUNTER — Other Ambulatory Visit: Payer: Self-pay | Admitting: *Deleted

## 2015-11-25 ENCOUNTER — Other Ambulatory Visit: Payer: BC Managed Care – PPO

## 2015-11-25 DIAGNOSIS — Z1211 Encounter for screening for malignant neoplasm of colon: Secondary | ICD-10-CM

## 2015-11-30 LAB — FECAL OCCULT BLOOD, IMMUNOCHEMICAL: FECAL OCCULT BLD: NEGATIVE

## 2016-01-14 ENCOUNTER — Encounter: Payer: Self-pay | Admitting: Pharmacist

## 2016-01-28 ENCOUNTER — Ambulatory Visit (INDEPENDENT_AMBULATORY_CARE_PROVIDER_SITE_OTHER): Payer: BC Managed Care – PPO | Admitting: Pharmacist

## 2016-01-28 DIAGNOSIS — Z86718 Personal history of other venous thrombosis and embolism: Secondary | ICD-10-CM | POA: Diagnosis not present

## 2016-01-28 DIAGNOSIS — Z7901 Long term (current) use of anticoagulants: Secondary | ICD-10-CM

## 2016-01-28 LAB — COAGUCHEK XS/INR WAIVED
INR: 2.2 — ABNORMAL HIGH (ref 0.9–1.1)
Prothrombin Time: 26.5 s

## 2016-01-28 NOTE — Patient Instructions (Signed)
Anticoagulation Dose Instructions as of 01/28/2016      Glynis SmilesSun Mon Tue Wed Thu Fri Sat   New Dose 5 mg 10 mg 5 mg 10 mg 5 mg 10 mg 5 mg    Description        Continue warfarin 10mg  - take 1 tablet mondays, wednesdays and fridays.  Take 1/2 tablet all other days.      INR was 2.2 today

## 2016-03-10 ENCOUNTER — Ambulatory Visit (INDEPENDENT_AMBULATORY_CARE_PROVIDER_SITE_OTHER): Payer: BC Managed Care – PPO | Admitting: Pharmacist

## 2016-03-10 DIAGNOSIS — Z86718 Personal history of other venous thrombosis and embolism: Secondary | ICD-10-CM | POA: Diagnosis not present

## 2016-03-10 DIAGNOSIS — Z7901 Long term (current) use of anticoagulants: Secondary | ICD-10-CM

## 2016-03-10 LAB — COAGUCHEK XS/INR WAIVED
INR: 2.5 — AB (ref 0.9–1.1)
Prothrombin Time: 30.5 s

## 2016-03-10 NOTE — Patient Instructions (Signed)
Gastroesophageal Reflux Disease, Adult Normally, food travels down the esophagus and stays in the stomach to be digested. If a person has gastroesophageal reflux disease (GERD), food and stomach acid move back up into the esophagus. When this happens, the esophagus becomes sore and swollen (inflamed). Over time, GERD can make small holes (ulcers) in the lining of the esophagus. HOME CARE Diet  Follow a diet as told by your doctor. You may need to avoid foods and drinks such as:  Coffee and tea (with or without caffeine).  Drinks that contain alcohol.  Energy drinks and sports drinks.  Carbonated drinks or sodas.  Chocolate and cocoa.  Peppermint and mint flavorings.  Garlic and onions.  Horseradish.  Spicy and acidic foods, such as peppers, chili powder, curry powder, vinegar, hot sauces, and BBQ sauce.  Citrus fruit juices and citrus fruits, such as oranges, lemons, and limes.  Tomato-based foods, such as red sauce, chili, salsa, and pizza with red sauce.  Fried and fatty foods, such as donuts, french fries, potato chips, and high-fat dressings.  High-fat meats, such as hot dogs, rib eye steak, sausage, ham, and bacon.  High-fat dairy items, such as whole milk, butter, and cream cheese.  Eat small meals often. Avoid eating large meals.  Avoid drinking large amounts of liquid with your meals.  Avoid eating meals during the 2-3 hours before bedtime.  Avoid lying down right after you eat.  Do not exercise right after you eat. General Instructions  Pay attention to any changes in your symptoms.  Take over-the-counter and prescription medicines only as told by your doctor. Do not take aspirin, ibuprofen, or other NSAIDs unless your doctor says it is okay.  Do not use any tobacco products, including cigarettes, chewing tobacco, and e-cigarettes. If you need help quitting, ask your doctor.  Wear loose clothes. Do not wear anything tight around your waist.  Raise  (elevate) the head of your bed about 6 inches (15 cm).  Try to lower your stress. If you need help doing this, ask your doctor.  If you are overweight, lose an amount of weight that is healthy for you. Ask your doctor about a safe weight loss goal.  Keep all follow-up visits as told by your doctor. This is important. GET HELP IF:  You have new symptoms.  You lose weight and you do not know why it is happening.  You have trouble swallowing, or it hurts to swallow.  You have wheezing or a cough that keeps happening.  Your symptoms do not get better with treatment.  You have a hoarse voice. GET HELP RIGHT AWAY IF:  You have pain in your arms, neck, jaw, teeth, or back.  You feel sweaty, dizzy, or light-headed.  You have chest pain or shortness of breath.  You throw up (vomit) and your throw up looks like blood or coffee grounds.  You pass out (faint).  Your poop (stool) is bloody or black.  You cannot swallow, drink, or eat.   This information is not intended to replace advice given to you by your health care provider. Make sure you discuss any questions you have with your health care provider.   Document Released: 01/18/2008 Document Revised: 04/22/2015 Document Reviewed: 11/26/2014 Elsevier Interactive Patient Education 2016 [employer]  -   

## 2016-04-16 ENCOUNTER — Other Ambulatory Visit: Payer: Self-pay | Admitting: Nurse Practitioner

## 2016-04-21 ENCOUNTER — Ambulatory Visit (INDEPENDENT_AMBULATORY_CARE_PROVIDER_SITE_OTHER): Payer: BC Managed Care – PPO | Admitting: Pharmacist

## 2016-04-21 DIAGNOSIS — Z86718 Personal history of other venous thrombosis and embolism: Secondary | ICD-10-CM

## 2016-04-21 DIAGNOSIS — Z7901 Long term (current) use of anticoagulants: Secondary | ICD-10-CM

## 2016-04-21 LAB — COAGUCHEK XS/INR WAIVED
INR: 1.9 — AB (ref 0.9–1.1)
PROTHROMBIN TIME: 23.2 s

## 2016-04-22 ENCOUNTER — Telehealth: Payer: Self-pay | Admitting: Pharmacist

## 2016-05-13 ENCOUNTER — Ambulatory Visit (INDEPENDENT_AMBULATORY_CARE_PROVIDER_SITE_OTHER): Payer: BC Managed Care – PPO | Admitting: Nurse Practitioner

## 2016-05-13 ENCOUNTER — Ambulatory Visit (INDEPENDENT_AMBULATORY_CARE_PROVIDER_SITE_OTHER): Payer: BC Managed Care – PPO

## 2016-05-13 ENCOUNTER — Encounter: Payer: Self-pay | Admitting: Nurse Practitioner

## 2016-05-13 VITALS — BP 122/80 | HR 76 | Temp 97.3°F | Ht 68.0 in | Wt 234.0 lb

## 2016-05-13 DIAGNOSIS — Z23 Encounter for immunization: Secondary | ICD-10-CM

## 2016-05-13 DIAGNOSIS — M1711 Unilateral primary osteoarthritis, right knee: Secondary | ICD-10-CM

## 2016-05-13 DIAGNOSIS — M25561 Pain in right knee: Secondary | ICD-10-CM | POA: Diagnosis not present

## 2016-05-13 MED ORDER — MELOXICAM 15 MG PO TABS: 15.0000 mg | ORAL_TABLET | Freq: Every day | ORAL | 0 refills | Status: DC

## 2016-05-13 NOTE — Patient Instructions (Signed)

## 2016-05-13 NOTE — Progress Notes (Signed)
   Subjective:    Patient ID: Christina [name], female    DOB: February 16, 1951, 65 y.o.   MRN: [id number]  HPI Patient in today c/o right knee pain- pos when sheis walking- not bothering her today.    Review of Systems  Constitutional: Negative.   HENT: Negative.   Respiratory: Negative.   Cardiovascular: Negative.   Genitourinary: Negative.   Neurological: Negative.   Psychiatric/Behavioral: Negative.   All other systems reviewed and are negative.      Objective:   Physical Exam  Constitutional: She is oriented to person, place, and time. She appears well-developed and well-nourished. No distress.  Cardiovascular: Normal rate, regular rhythm and normal heart sounds.   Pulmonary/Chest: Effort normal and breath sounds normal.  Musculoskeletal:  FROM of right knee with pain on extension. CLicking on full extension. No patella tenderness Slight effusion  Neurological: She is alert and oriented to person, place, and time.  Skin: Skin is warm.  Psychiatric: She has a normal mood and affect. Her behavior is normal. Judgment and thought content normal.   BP 122/80   Pulse 76   Temp 97.3 F (36.3 C) (Oral)   Ht 5\' 8"  (1.727 m)   Wt 234 lb (106.1 kg)   BMI 35.58 kg/m   Right knee x ary- osteoarthritis of right knee-Preliminary reading by Paulene FloorMary Colsen Modi, FNP  [facility]       Assessment & Plan:   1. Right knee pain   2. Primary osteoarthritis of right knee    Meds ordered this encounter  Medications  . meloxicam (MOBIC) 15 MG tablet    Sig: Take 1 tablet (15 mg total) by mouth daily.    Dispense:  30 tablet    Refill:  0    Order Specific Question:   Supervising Provider    Answer:   Oswaldo DoneVINCENT, CAROL L [[id number]]   Orders Placed This Encounter  Procedures  . DG Knee 1-2 Views Right    Standing Status:   Future    Number of Occurrences:   1    Standing Expiration Date:   07/13/2017    Order Specific Question:   Reason for Exam (SYMPTOM  OR DIAGNOSIS REQUIRED)    Answer:   right knee  pain    Order Specific Question:   Preferred imaging location?    Answer:   Internal   Rest Wear brace when up on knee a lot Discussed weight loss RTO prn  Mary-Margaret Daphine DeutscherMartin, FNP

## 2016-05-18 ENCOUNTER — Other Ambulatory Visit: Payer: Self-pay | Admitting: Nurse Practitioner

## 2016-05-20 ENCOUNTER — Encounter: Payer: Self-pay | Admitting: Nurse Practitioner

## 2016-05-20 ENCOUNTER — Ambulatory Visit (INDEPENDENT_AMBULATORY_CARE_PROVIDER_SITE_OTHER): Payer: BC Managed Care – PPO | Admitting: Nurse Practitioner

## 2016-05-20 VITALS — BP 111/70 | HR 76 | Temp 98.1°F | Ht 68.0 in | Wt 236.0 lb

## 2016-05-20 DIAGNOSIS — I10 Essential (primary) hypertension: Secondary | ICD-10-CM | POA: Diagnosis not present

## 2016-05-20 DIAGNOSIS — Z6835 Body mass index (BMI) 35.0-35.9, adult: Secondary | ICD-10-CM

## 2016-05-20 DIAGNOSIS — Z1159 Encounter for screening for other viral diseases: Secondary | ICD-10-CM

## 2016-05-20 DIAGNOSIS — K219 Gastro-esophageal reflux disease without esophagitis: Secondary | ICD-10-CM | POA: Diagnosis not present

## 2016-05-20 DIAGNOSIS — Z7901 Long term (current) use of anticoagulants: Secondary | ICD-10-CM | POA: Diagnosis not present

## 2016-05-20 DIAGNOSIS — Z86718 Personal history of other venous thrombosis and embolism: Secondary | ICD-10-CM | POA: Diagnosis not present

## 2016-05-20 DIAGNOSIS — E785 Hyperlipidemia, unspecified: Secondary | ICD-10-CM

## 2016-05-20 LAB — COAGUCHEK XS/INR WAIVED
INR: 3 — ABNORMAL HIGH (ref 0.9–1.1)
PROTHROMBIN TIME: 36.2 s

## 2016-05-20 MED ORDER — TRIBENZOR 40-5-12.5 MG PO TABS: 1.0000 | ORAL_TABLET | Freq: Every day | ORAL | 5 refills | Status: DC

## 2016-05-20 MED ORDER — WARFARIN SODIUM 10 MG PO TABS: 10.0000 mg | ORAL_TABLET | Freq: Every day | ORAL | 5 refills | Status: DC

## 2016-05-20 MED ORDER — ATORVASTATIN CALCIUM 40 MG PO TABS: ORAL_TABLET | ORAL | 5 refills | Status: DC

## 2016-05-20 MED ORDER — OMEPRAZOLE 40 MG PO CPDR: 40.0000 mg | DELAYED_RELEASE_CAPSULE | Freq: Every day | ORAL | 3 refills | Status: DC

## 2016-05-20 NOTE — Patient Instructions (Signed)
Hypertension Hypertension, commonly called high blood pressure, is when the force of blood pumping through your arteries is too strong. Your arteries are the blood vessels that carry blood from your heart throughout your body. A blood pressure reading consists of a higher number over a lower number, such as 110/72. The higher number (systolic) is the pressure inside your arteries when your heart pumps. The lower number (diastolic) is the pressure inside your arteries when your heart relaxes. Ideally you want your blood pressure below 120/80. Hypertension forces your heart to work harder to pump blood. Your arteries may become narrow or stiff. Having untreated or uncontrolled hypertension can cause heart attack, stroke, kidney disease, and other problems. RISK FACTORS Some risk factors for high blood pressure are controllable. Others are not.  Risk factors you cannot control include:   Race. You may be at higher risk if you are African American.  Age. Risk increases with age.  Gender. Men are at higher risk than women before age 45 years. After age 65, women are at higher risk than men. Risk factors you can control include:  Not getting enough exercise or physical activity.  Being overweight.  Getting too much fat, sugar, calories, or salt in your diet.  Drinking too much alcohol. SIGNS AND SYMPTOMS Hypertension does not usually cause signs or symptoms. Extremely high blood pressure (hypertensive crisis) may cause headache, anxiety, shortness of breath, and nosebleed. DIAGNOSIS To check if you have hypertension, your health care provider will measure your blood pressure while you are seated, with your arm held at the level of your heart. It should be measured at least twice using the same arm. Certain conditions can cause a difference in blood pressure between your right and left arms. A blood pressure reading that is higher than normal on one occasion does not mean that you need treatment. If  it is not clear whether you have high blood pressure, you may be asked to return on a different day to have your blood pressure checked again. Or, you may be asked to monitor your blood pressure at home for 1 or more weeks. TREATMENT Treating high blood pressure includes making lifestyle changes and possibly taking medicine. Living a healthy lifestyle can help lower high blood pressure. You may need to change some of your habits. Lifestyle changes may include:  Following the DASH diet. This diet is high in fruits, vegetables, and whole grains. It is low in salt, red meat, and added sugars.  Keep your sodium intake below 2,300 mg per day.  Getting at least 30-45 minutes of aerobic exercise at least 4 times per week.  Losing weight if necessary.  Not smoking.  Limiting alcoholic beverages.  Learning ways to reduce stress. Your health care provider may prescribe medicine if lifestyle changes are not enough to get your blood pressure under control, and if one of the following is true:  You are 18-59 years of age and your systolic blood pressure is above 140.  You are 60 years of age or older, and your systolic blood pressure is above 150.  Your diastolic blood pressure is above 90.  You have diabetes, and your systolic blood pressure is over 140 or your diastolic blood pressure is over 90.  You have kidney disease and your blood pressure is above 140/90.  You have heart disease and your blood pressure is above 140/90. Your personal target blood pressure may vary depending on your medical conditions, your age, and other factors. HOME CARE INSTRUCTIONS    Have your blood pressure rechecked as directed by your health care provider.   Take medicines only as directed by your health care provider. Follow the directions carefully. Blood pressure medicines must be taken as prescribed. The medicine does not work as well when you skip doses. Skipping doses also puts you at risk for  problems.  Do not smoke.   Monitor your blood pressure at home as directed by your health care provider. SEEK MEDICAL CARE IF:   You think you are having a reaction to medicines taken.  You have recurrent headaches or feel dizzy.  You have swelling in your ankles.  You have trouble with your vision. SEEK IMMEDIATE MEDICAL CARE IF:  You develop a severe headache or confusion.  You have unusual weakness, numbness, or feel faint.  You have severe chest or abdominal pain.  You vomit repeatedly.  You have trouble breathing. MAKE SURE YOU:   Understand these instructions.  Will watch your condition.  Will get help right away if you are not doing well or get worse.   This information is not intended to replace advice given to you by your health care provider. Make sure you discuss any questions you have with your health care provider.   Document Released: 08/01/2005 Document Revised: 12/16/2014 Document Reviewed: 05/24/2013 Elsevier Interactive Patient Education 2016 Elsevier Inc.  

## 2016-05-20 NOTE — Progress Notes (Signed)
Subjective:    Patient ID: Christina [name], female    DOB: 09/05/1950, 65 y.o.   MRN: [id number]  Patient here today for follow up of chronic medical problems. Patient doing well today her only complaint is occasion heart burn after eating.  Outpatient Encounter Prescriptions as of 05/20/2016  Medication Sig  . atorvastatin (LIPITOR) 40 MG tablet TAKE 1 TABLET (40 MG TOTAL) BY MOUTH DAILY.  Marland Kitchen Cholecalciferol (VITAMIN D-3) 5000 UNITS TABS Take 1 capsule by mouth daily.  . meloxicam (MOBIC) 15 MG tablet Take 1 tablet (15 mg total) by mouth daily.  Jabier Gauss 40-5-12.5 MG TABS TAKE 1 TABLET BY MOUTH DAILY.  Marland Kitchen warfarin (COUMADIN) 10 MG tablet Take 1 tablet (10 mg total) by mouth daily.   No facility-administered encounter medications on file as of 05/20/2016.      Hypertension  This is a chronic problem. The current episode started more than 1 year ago. The problem is unchanged. The problem is controlled. Pertinent negatives include no headaches or shortness of breath. Risk factors for coronary artery disease include dyslipidemia, family history, obesity, post-menopausal state and sedentary lifestyle. Past treatments include angiotensin blockers, calcium channel blockers and diuretics. The current treatment provides moderate improvement. Compliance problems include diet and exercise.   Hyperlipidemia  This is a chronic problem. The current episode started more than 1 year ago. The problem is controlled. Recent lipid tests were reviewed and are normal. Exacerbating diseases include obesity. She has no history of diabetes or hypothyroidism. Pertinent negatives include no myalgias or shortness of breath. Current antihyperlipidemic treatment includes statins. The current treatment provides moderate improvement of lipids. Compliance problems include adherence to diet and adherence to exercise.  Risk factors for coronary artery disease include dyslipidemia, family history, hypertension and obesity.  Hx  DVT Currently on coumadin therapy- doing well- no bleeding, no compint of lower ext pain or swelling.   Review of Systems  Constitutional: Negative.   HENT: Negative.   Respiratory: Negative for shortness of breath.   Genitourinary: Negative.   Musculoskeletal: Negative for myalgias.  Neurological: Negative for headaches.  Psychiatric/Behavioral: Negative.   All other systems reviewed and are negative.      Objective:   Physical Exam  Constitutional: She is oriented to person, place, and time. She appears well-developed and well-nourished.  HENT:  Nose: Nose normal.  Mouth/Throat: Oropharynx is clear and moist.  Eyes: EOM are normal.  Neck: Trachea normal, normal range of motion and full passive range of motion without pain. Neck supple. No JVD present. Carotid bruit is not present. No thyromegaly present.  Cardiovascular: Normal rate, regular rhythm, normal heart sounds and intact distal pulses.  Exam reveals no gallop and no friction rub.   No murmur heard. Pulmonary/Chest: Effort normal and breath sounds normal.  Abdominal: Soft. Bowel sounds are normal. She exhibits no distension and no mass. There is no tenderness.  Musculoskeletal: Normal range of motion.  Lymphadenopathy:    She has no cervical adenopathy.  Neurological: She is alert and oriented to person, place, and time. She has normal reflexes.  Skin: Skin is warm and dry.  Psychiatric: She has a normal mood and affect. Her behavior is normal. Judgment and thought content normal.    BP 111/70   Pulse 76   Temp 98.1 F (36.7 C) (Oral)   Ht _0  (1.727 m)   Wt 236 lb (107 kg)   BMI 35.88 kg/m       Assessment & Plan:  1. History of  DVT of lower extremity - CoaguChek XS/INR Waived  2. Chronic anticoagulation Recheck INR in 4 weeks - CoaguChek XS/INR Waived  3. Need for hepatitis C screening test - Hepatitis C antibody  4. Essential hypertension, benign Do not add salt to diet - warfarin (COUMADIN)  10 MG tablet; Take 1 tablet (10 mg total) by mouth daily.  Dispense: 30 tablet; Refill: 5 - TRIBENZOR 40-5-12.5 MG TABS; Take 1 tablet by mouth daily.  Dispense: 30 tablet; Refill: 5 - CMP14+EGFR  5. BMI 35.0-35.9,adult Discussed diet and exercise for person with BMI >25 Will recheck weight in 3-6 months   6. Hyperlipidemia with target LDL less than 100 *low fat diet - atorvastatin (LIPITOR) 40 MG tablet; TAKE 1 TABLET (40 MG TOTAL) BY MOUTH DAILY.  Dispense: 30 tablet; Refill: 5 - Lipid panel  7. On anticoagulant therapy  8. Gastroesophageal reflux disease without esophagitis Avoid spicy foods Do not eat 2 hours prior to bedtime - omeprazole 40 mg 1 poQD #30 3 rf    Labs pending Health maintenance reviewed Diet and exercise encouraged Continue all meds Follow up  In 6 months   [place], FNP

## 2016-05-21 LAB — LIPID PANEL
Chol/HDL Ratio: 3.5 ratio units (ref 0.0–4.4)
Cholesterol, Total: 163 mg/dL (ref 100–199)
HDL: 46 mg/dL (ref >39)
LDL Calculated: 89 mg/dL (ref 0–99)
Triglycerides: 141 mg/dL (ref 0–149)
VLDL Cholesterol Cal: 28 mg/dL (ref 5–40)

## 2016-05-21 LAB — CMP14+EGFR
A/G RATIO: 1.7 (ref 1.2–2.2)
ALT: 21 IU/L (ref 0–32)
AST: 29 IU/L (ref 0–40)
Albumin: 4.2 g/dL (ref 3.6–4.8)
Alkaline Phosphatase: 95 IU/L (ref 39–117)
BUN/Creatinine Ratio: 17 (ref 12–28)
BUN: 22 mg/dL (ref 8–27)
Bilirubin Total: 0.2 mg/dL (ref 0.0–1.2)
CO2: 28 mmol/L (ref 18–29)
Calcium: 9.8 mg/dL (ref 8.7–10.3)
Chloride: 105 mmol/L (ref 96–106)
Creatinine, Ser: 1.26 mg/dL — ABNORMAL HIGH (ref 0.57–1.00)
GFR calc non Af Amer: 44 mL/min/{1.73_m2} — ABNORMAL LOW (ref >59)
GFR, EST AFRICAN AMERICAN: 51 mL/min/{1.73_m2} — AB (ref >59)
Globulin, Total: 2.5 g/dL (ref 1.5–4.5)
Glucose: 88 mg/dL (ref 65–99)
POTASSIUM: 4.6 mmol/L (ref 3.5–5.2)
Sodium: 145 mmol/L — ABNORMAL HIGH (ref 134–144)
TOTAL PROTEIN: 6.7 g/dL (ref 6.0–8.5)

## 2016-05-21 LAB — HEPATITIS C ANTIBODY

## 2016-06-06 ENCOUNTER — Telehealth: Payer: Self-pay | Admitting: Nurse Practitioner

## 2016-06-07 NOTE — Telephone Encounter (Signed)
appt made

## 2016-06-09 ENCOUNTER — Ambulatory Visit (INDEPENDENT_AMBULATORY_CARE_PROVIDER_SITE_OTHER): Payer: BC Managed Care – PPO | Admitting: Nurse Practitioner

## 2016-06-09 ENCOUNTER — Encounter: Payer: Self-pay | Admitting: Nurse Practitioner

## 2016-06-09 ENCOUNTER — Other Ambulatory Visit: Payer: Self-pay | Admitting: Nurse Practitioner

## 2016-06-09 VITALS — BP 107/72 | HR 77 | Temp 98.0°F | Ht 68.0 in | Wt 229.0 lb

## 2016-06-09 DIAGNOSIS — M1711 Unilateral primary osteoarthritis, right knee: Secondary | ICD-10-CM

## 2016-06-09 MED ORDER — PREDNISONE 20 MG PO TABS: ORAL_TABLET | ORAL | 0 refills | Status: DC

## 2016-06-09 NOTE — Patient Instructions (Signed)

## 2016-06-09 NOTE — Progress Notes (Signed)
   Subjective:    Patient ID: Christina [name], female    DOB: [birth year], 65 y.o.   MRN: [id number]  HPI patient in c/o right leg pain- has been intermittent for awhile. Says that hurts on lateral side of right leg and up to buttocks. When asked if going from buttocks down ,she says it possible. Some days are worse then others. Rates pain 2/10- catching type of pain. Walking increases pain at times. She says most of pain is in right knee.  * Xray of right knee at last visit showed osteoarthritis severe.  Review of Systems  Constitutional: Negative.   HENT: Negative.   Respiratory: Negative.   Cardiovascular: Negative.   Musculoskeletal: Positive for joint swelling.  Neurological: Negative.   Psychiatric/Behavioral: Negative.   All other systems reviewed and are negative.      Objective:   Physical Exam  Constitutional: She is oriented to person, place, and time. She appears well-developed and well-nourished. No distress.  Cardiovascular: Normal rate, regular rhythm and normal heart sounds.   Musculoskeletal: Normal range of motion.  Mild right knee effusion Mild patella tenderness Crepitus on full flexion and extension.   Neurological: She is alert and oriented to person, place, and time.  Psychiatric: She has a normal mood and affect. Her behavior is normal. Judgment and thought content normal.    BP 107/72   Pulse 77   Temp 98 F (36.7 C) (Oral)   Ht 5\' 8"  (1.727 m)   Wt 229 lb (103.9 kg)   BMI 34.82 kg/m        Assessment & Plan:  1. Primary osteoarthritis of right knee Moist heat Rest Compression sleeve when up on knee RTO prn  - predniSONE (DELTASONE) 20 MG tablet; 2 po at sametime daily for 5 days  Dispense: 10 tablet; Refill: 0 - Ambulatory referral to Orthopedic Surgery  Mary-Margaret Daphine DeutscherMartin, FNP

## 2016-06-30 ENCOUNTER — Ambulatory Visit (INDEPENDENT_AMBULATORY_CARE_PROVIDER_SITE_OTHER): Payer: BC Managed Care – PPO | Admitting: Pharmacist

## 2016-06-30 DIAGNOSIS — Z86718 Personal history of other venous thrombosis and embolism: Secondary | ICD-10-CM | POA: Diagnosis not present

## 2016-06-30 DIAGNOSIS — Z7901 Long term (current) use of anticoagulants: Secondary | ICD-10-CM

## 2016-06-30 LAB — COAGUCHEK XS/INR WAIVED
INR: 2.2 — ABNORMAL HIGH (ref 0.9–1.1)
PROTHROMBIN TIME: 26.9 s

## 2016-07-16 ENCOUNTER — Other Ambulatory Visit: Payer: Self-pay | Admitting: Nurse Practitioner

## 2016-07-16 DIAGNOSIS — M1711 Unilateral primary osteoarthritis, right knee: Secondary | ICD-10-CM

## 2016-07-19 NOTE — Telephone Encounter (Signed)
We do not do refills on prednisone- what does she need it for?

## 2016-07-27 ENCOUNTER — Other Ambulatory Visit: Payer: Self-pay | Admitting: Nurse Practitioner

## 2016-07-27 DIAGNOSIS — M1711 Unilateral primary osteoarthritis, right knee: Secondary | ICD-10-CM

## 2016-07-29 NOTE — Telephone Encounter (Signed)
Do not do refills on prednisone- what is she asking for it for?

## 2016-08-11 ENCOUNTER — Ambulatory Visit (INDEPENDENT_AMBULATORY_CARE_PROVIDER_SITE_OTHER): Payer: BC Managed Care – PPO | Admitting: Pharmacist

## 2016-08-11 DIAGNOSIS — Z7901 Long term (current) use of anticoagulants: Secondary | ICD-10-CM | POA: Diagnosis not present

## 2016-08-11 DIAGNOSIS — Z86718 Personal history of other venous thrombosis and embolism: Secondary | ICD-10-CM

## 2016-08-11 LAB — COAGUCHEK XS/INR WAIVED
INR: 1.7 — AB (ref 0.9–1.1)
PROTHROMBIN TIME: 19.8 s

## 2016-08-26 ENCOUNTER — Other Ambulatory Visit: Payer: Self-pay | Admitting: Nurse Practitioner

## 2016-08-26 ENCOUNTER — Telehealth: Payer: Self-pay | Admitting: Nurse Practitioner

## 2016-08-26 MED ORDER — OLMESARTAN MEDOXOMIL-HCTZ 40-12.5 MG PO TABS: 1.0000 | ORAL_TABLET | Freq: Every day | ORAL | 5 refills | Status: DC

## 2016-08-26 MED ORDER — AMLODIPINE BESYLATE 5 MG PO TABS: 5.0000 mg | ORAL_TABLET | Freq: Every day | ORAL | 5 refills | Status: DC

## 2016-08-26 NOTE — Telephone Encounter (Signed)
Called patient and discussed med changes with her benicar 40/12.5 daily and amlodipine 5mg  daily- she will let me know if is to expensive.

## 2016-08-26 NOTE — Telephone Encounter (Signed)
Pt called and said her BP medication is now $300 and $100 with a coupon and she can't afford that. Is there anything else she can take?

## 2016-08-29 ENCOUNTER — Telehealth: Payer: Self-pay | Admitting: Nurse Practitioner

## 2016-08-29 NOTE — Telephone Encounter (Signed)
Spoke with patient and meds were 30$ she is going to go ahead and get it .

## 2016-08-29 NOTE — Telephone Encounter (Signed)
Please advise on cheaper blood pressure medication.

## 2016-09-04 ENCOUNTER — Other Ambulatory Visit: Payer: Self-pay | Admitting: Nurse Practitioner

## 2016-09-04 DIAGNOSIS — M1711 Unilateral primary osteoarthritis, right knee: Secondary | ICD-10-CM

## 2016-09-08 ENCOUNTER — Encounter: Payer: Self-pay | Admitting: Pharmacist

## 2016-09-12 ENCOUNTER — Ambulatory Visit (INDEPENDENT_AMBULATORY_CARE_PROVIDER_SITE_OTHER): Payer: BC Managed Care – PPO | Admitting: Pharmacist

## 2016-09-12 DIAGNOSIS — Z86718 Personal history of other venous thrombosis and embolism: Secondary | ICD-10-CM

## 2016-09-12 DIAGNOSIS — Z7901 Long term (current) use of anticoagulants: Secondary | ICD-10-CM | POA: Diagnosis not present

## 2016-09-12 LAB — COAGUCHEK XS/INR WAIVED
INR: 2.1 — AB (ref 0.9–1.1)
PROTHROMBIN TIME: 25.4 s

## 2016-10-02 ENCOUNTER — Other Ambulatory Visit: Payer: Self-pay | Admitting: Nurse Practitioner

## 2016-10-02 DIAGNOSIS — M1711 Unilateral primary osteoarthritis, right knee: Secondary | ICD-10-CM

## 2016-10-26 ENCOUNTER — Ambulatory Visit (INDEPENDENT_AMBULATORY_CARE_PROVIDER_SITE_OTHER): Payer: BC Managed Care – PPO | Admitting: Pharmacist

## 2016-10-26 DIAGNOSIS — Z86718 Personal history of other venous thrombosis and embolism: Secondary | ICD-10-CM

## 2016-10-26 DIAGNOSIS — Z7901 Long term (current) use of anticoagulants: Secondary | ICD-10-CM | POA: Diagnosis not present

## 2016-10-26 LAB — COAGUCHEK XS/INR WAIVED
INR: 2.8 — AB (ref 0.9–1.1)
Prothrombin Time: 34.1 s

## 2016-10-31 ENCOUNTER — Other Ambulatory Visit: Payer: Self-pay | Admitting: Nurse Practitioner

## 2016-10-31 DIAGNOSIS — M1711 Unilateral primary osteoarthritis, right knee: Secondary | ICD-10-CM

## 2016-11-29 ENCOUNTER — Other Ambulatory Visit: Payer: Self-pay | Admitting: Nurse Practitioner

## 2016-11-29 DIAGNOSIS — M1711 Unilateral primary osteoarthritis, right knee: Secondary | ICD-10-CM

## 2016-12-12 ENCOUNTER — Ambulatory Visit (INDEPENDENT_AMBULATORY_CARE_PROVIDER_SITE_OTHER): Payer: BC Managed Care – PPO | Admitting: Nurse Practitioner

## 2016-12-12 ENCOUNTER — Encounter: Payer: Self-pay | Admitting: Nurse Practitioner

## 2016-12-12 VITALS — BP 111/74 | HR 80 | Temp 97.2°F | Ht 68.0 in | Wt 230.0 lb

## 2016-12-12 DIAGNOSIS — K219 Gastro-esophageal reflux disease without esophagitis: Secondary | ICD-10-CM | POA: Diagnosis not present

## 2016-12-12 DIAGNOSIS — Z86718 Personal history of other venous thrombosis and embolism: Secondary | ICD-10-CM

## 2016-12-12 DIAGNOSIS — I10 Essential (primary) hypertension: Secondary | ICD-10-CM

## 2016-12-12 DIAGNOSIS — Z6834 Body mass index (BMI) 34.0-34.9, adult: Secondary | ICD-10-CM

## 2016-12-12 DIAGNOSIS — Z7901 Long term (current) use of anticoagulants: Secondary | ICD-10-CM | POA: Diagnosis not present

## 2016-12-12 DIAGNOSIS — E785 Hyperlipidemia, unspecified: Secondary | ICD-10-CM | POA: Diagnosis not present

## 2016-12-12 LAB — COAGUCHEK XS/INR WAIVED
INR: 2.1 — AB (ref 0.9–1.1)
PROTHROMBIN TIME: 25.3 s

## 2016-12-12 MED ORDER — OMEPRAZOLE 40 MG PO CPDR: 40.0000 mg | DELAYED_RELEASE_CAPSULE | Freq: Every day | ORAL | 3 refills | Status: DC

## 2016-12-12 MED ORDER — AMLODIPINE BESYLATE 5 MG PO TABS: 5.0000 mg | ORAL_TABLET | Freq: Every day | ORAL | 5 refills | Status: DC

## 2016-12-12 MED ORDER — WARFARIN SODIUM 10 MG PO TABS: 10.0000 mg | ORAL_TABLET | Freq: Every day | ORAL | 5 refills | Status: DC

## 2016-12-12 MED ORDER — ATORVASTATIN CALCIUM 40 MG PO TABS: ORAL_TABLET | ORAL | 5 refills | Status: DC

## 2016-12-12 MED ORDER — V-2 HIGH COMPRESSION HOSE MISC: 0 refills | Status: DC

## 2016-12-12 MED ORDER — OLMESARTAN MEDOXOMIL-HCTZ 40-12.5 MG PO TABS: 1.0000 | ORAL_TABLET | Freq: Every day | ORAL | 5 refills | Status: DC

## 2016-12-12 NOTE — Patient Instructions (Signed)

## 2016-12-12 NOTE — Addendum Note (Signed)
Addended by: Bennie Pierini on: 12/12/2016 04:22 PM   Modules accepted: Orders

## 2016-12-12 NOTE — Progress Notes (Signed)
Subjective:    Patient ID: Christina [name], female    DOB: 05-Jun-1951, 66 y.o.   MRN: [id number]  HPI  Christina [name] is here today for follow up of chronic medical problem.  Outpatient Encounter Prescriptions as of 12/12/2016  Medication Sig  . amLODipine (NORVASC) 5 MG tablet Take 1 tablet (5 mg total) by mouth daily.  Marland Kitchen atorvastatin (LIPITOR) 40 MG tablet TAKE 1 TABLET (40 MG TOTAL) BY MOUTH DAILY.  Marland Kitchen Cholecalciferol (VITAMIN D-3) 5000 UNITS TABS Take 1 capsule by mouth daily.  . meloxicam (MOBIC) 15 MG tablet TAKE 1 TABLET BY MOUTH DAILY  . olmesartan-hydrochlorothiazide (BENICAR HCT) 40-12.5 MG tablet Take 1 tablet by mouth daily.  Marland Kitchen omeprazole (PRILOSEC) 40 MG capsule Take 1 capsule (40 mg total) by mouth daily.  Marland Kitchen warfarin (COUMADIN) 10 MG tablet Take 1 tablet (10 mg total) by mouth daily.   No facility-administered encounter medications on file as of 12/12/2016.     1. Essential hypertension, benign  No c/o chest pain,SOB or HA- does not check blood pressure at home  2. BMI 35.0-35.9,adult   no recent weight gain or weight loss  3. Hyperlipidemia with target LDL less than 100  Does not watch diet  4. On anticoagulant therapy  Had history of DVT and does not want to sop coumadin. Last INR was done 10/16/16 was 2.8    New complaints: None today    Review of Systems  Constitutional: Negative for diaphoresis.  Eyes: Negative for pain.  Respiratory: Negative for shortness of breath.   Cardiovascular: Negative for chest pain, palpitations and leg swelling.  Gastrointestinal: Negative for abdominal pain.  Endocrine: Negative for polydipsia.  Skin: Negative for rash.  Neurological: Negative for dizziness, weakness and headaches.  Hematological: Does not bruise/bleed easily.       Objective:   Physical Exam  Constitutional: She is oriented to person, place, and time. She appears well-developed and well-nourished.  HENT:  Nose: Nose normal.  Mouth/Throat: Oropharynx is  clear and moist.  Eyes: EOM are normal.  Neck: Trachea normal, normal range of motion and full passive range of motion without pain. Neck supple. No JVD present. Carotid bruit is not present. No thyromegaly present.  Cardiovascular: Normal rate, regular rhythm, normal heart sounds and intact distal pulses.  Exam reveals no gallop and no friction rub.   No murmur heard. Pulmonary/Chest: Effort normal and breath sounds normal.  Abdominal: Soft. Bowel sounds are normal. She exhibits no distension and no mass. There is no tenderness.  Musculoskeletal: Normal range of motion.  Lymphadenopathy:    She has no cervical adenopathy.  Neurological: She is alert and oriented to person, place, and time. She has normal reflexes.  Skin: Skin is warm and dry.  Psychiatric: She has a normal mood and affect. Her behavior is normal. Judgment and thought content normal.   BP 111/74   Pulse 80   Temp 97.2 F (36.2 C) (Oral)   Ht '5\' 8"'  (1.727 m)   Wt 230 lb (104.3 kg)   BMI 34.97 kg/m   INR 2.1     Assessment & Plan:   1. Essential hypertension, benign Low sodium diet - CMP14+EGFR - amLODipine (NORVASC) 5 MG tablet; Take 1 tablet (5 mg total) by mouth daily.  Dispense: 30 tablet; Refill: 5 - olmesartan-hydrochlorothiazide (BENICAR HCT) 40-12.5 MG tablet; Take 1 tablet by mouth daily.  Dispense: 30 tablet; Refill: 5  2. BMI 34.0-34.9,adult Discussed diet and exercise for person with BMI >25 Will  recheck weight in 3-6 months  3. Hyperlipidemia with target LDL less than 100 Low fat diet - Lipid panel - atorvastatin (LIPITOR) 40 MG tablet; TAKE 1 TABLET (40 MG TOTAL) BY MOUTH DAILY.  Dispense: 30 tablet; Refill: 5  4. On anticoagulant therapy Follow up in 1 month - CoaguChek XS/INR Waived - warfarin (COUMADIN) 10 MG tablet; Take 1 tablet (10 mg total) by mouth daily.  Dispense: 30 tablet; Refill: 5  5. History of DVT of lower extremity  6. Gastroesophageal reflux disease without  esophagitis /Avoid spicy foods Do not eat 2 hours prior to bedtime - omeprazole (PRILOSEC) 40 MG capsule; Take 1 capsule (40 mg total) by mouth daily.  Dispense: 30 capsule; Refill: 3    Labs pending Health maintenance reviewed Diet and exercise encouraged Continue all meds Follow up  In 6 months   [place], FNP

## 2016-12-13 ENCOUNTER — Telehealth: Payer: Self-pay | Admitting: Nurse Practitioner

## 2016-12-13 ENCOUNTER — Other Ambulatory Visit: Payer: Self-pay | Admitting: Nurse Practitioner

## 2016-12-13 DIAGNOSIS — R945 Abnormal results of liver function studies: Principal | ICD-10-CM

## 2016-12-13 DIAGNOSIS — R7989 Other specified abnormal findings of blood chemistry: Secondary | ICD-10-CM

## 2016-12-13 LAB — CMP14+EGFR
A/G RATIO: 1.5 (ref 1.2–2.2)
ALBUMIN: 4.1 g/dL (ref 3.6–4.8)
ALK PHOS: 139 IU/L — AB (ref 39–117)
ALT: 178 IU/L — ABNORMAL HIGH (ref 0–32)
AST: 155 IU/L — ABNORMAL HIGH (ref 0–40)
BILIRUBIN TOTAL: 0.2 mg/dL (ref 0.0–1.2)
BUN / CREAT RATIO: 20 (ref 12–28)
BUN: 27 mg/dL (ref 8–27)
CHLORIDE: 103 mmol/L (ref 96–106)
CO2: 23 mmol/L (ref 18–29)
Calcium: 9.6 mg/dL (ref 8.7–10.3)
Creatinine, Ser: 1.33 mg/dL — ABNORMAL HIGH (ref 0.57–1.00)
GFR calc non Af Amer: 41 mL/min/{1.73_m2} — ABNORMAL LOW (ref >59)
GFR, EST AFRICAN AMERICAN: 48 mL/min/{1.73_m2} — AB (ref >59)
GLOBULIN, TOTAL: 2.8 g/dL (ref 1.5–4.5)
GLUCOSE: 101 mg/dL — AB (ref 65–99)
POTASSIUM: 4.6 mmol/L (ref 3.5–5.2)
SODIUM: 140 mmol/L (ref 134–144)
TOTAL PROTEIN: 6.9 g/dL (ref 6.0–8.5)

## 2016-12-13 LAB — LIPID PANEL
CHOLESTEROL TOTAL: 156 mg/dL (ref 100–199)
Chol/HDL Ratio: 3 ratio (ref 0.0–4.4)
HDL: 52 mg/dL (ref >39)
LDL Calculated: 79 mg/dL (ref 0–99)
Triglycerides: 124 mg/dL (ref 0–149)
VLDL CHOLESTEROL CAL: 25 mg/dL (ref 5–40)

## 2016-12-13 NOTE — Telephone Encounter (Signed)
Pt aware of lab results, as well as [place] appt at [facility]

## 2016-12-15 LAB — HEPATIC FUNCTION PANEL
ALT: 169 IU/L — AB (ref 0–32)
AST: 151 IU/L — AB (ref 0–40)
Albumin: 4.1 g/dL (ref 3.6–4.8)
Alkaline Phosphatase: 139 IU/L — ABNORMAL HIGH (ref 39–117)
Bilirubin Total: <0.2 mg/dL (ref 0.0–1.2)
Bilirubin, Direct: 0.08 mg/dL (ref 0.00–0.40)
Total Protein: 6.6 g/dL (ref 6.0–8.5)

## 2016-12-15 LAB — SPECIMEN STATUS REPORT

## 2016-12-16 ENCOUNTER — Other Ambulatory Visit: Payer: Self-pay | Admitting: Nurse Practitioner

## 2016-12-16 ENCOUNTER — Ambulatory Visit (HOSPITAL_COMMUNITY)
Admission: RE | Admit: 2016-12-16 | Discharge: 2016-12-16 | Disposition: A | Discharge status: Home / Self Care | Payer: BC Managed Care – PPO | Source: Ambulatory Visit | Attending: Nurse Practitioner | Admitting: Nurse Practitioner

## 2016-12-16 DIAGNOSIS — R945 Abnormal results of liver function studies: Secondary | ICD-10-CM

## 2016-12-16 DIAGNOSIS — N281 Cyst of kidney, acquired: Secondary | ICD-10-CM | POA: Insufficient documentation

## 2016-12-16 DIAGNOSIS — N2889 Other specified disorders of kidney and ureter: Secondary | ICD-10-CM

## 2016-12-16 DIAGNOSIS — R7989 Other specified abnormal findings of blood chemistry: Secondary | ICD-10-CM | POA: Diagnosis present

## 2016-12-16 NOTE — Progress Notes (Signed)
US sowed normal liver however a solid mass was found on right kidney- ordered MRI with and without contrast-patient aware

## 2016-12-22 ENCOUNTER — Ambulatory Visit (HOSPITAL_COMMUNITY)
Admission: RE | Admit: 2016-12-22 | Discharge: 2016-12-22 | Disposition: A | Discharge status: Home / Self Care | Payer: BC Managed Care – PPO | Source: Ambulatory Visit | Attending: Nurse Practitioner | Admitting: Nurse Practitioner

## 2016-12-22 DIAGNOSIS — N2889 Other specified disorders of kidney and ureter: Secondary | ICD-10-CM | POA: Diagnosis present

## 2016-12-22 DIAGNOSIS — N281 Cyst of kidney, acquired: Secondary | ICD-10-CM | POA: Insufficient documentation

## 2016-12-22 LAB — HEPATITIS PANEL, ACUTE
HEP B C IGM: NEGATIVE
Hep A IgM: NEGATIVE
Hepatitis B Surface Ag: NEGATIVE

## 2016-12-22 LAB — SPECIMEN STATUS REPORT

## 2016-12-22 MED ORDER — GADOBENATE DIMEGLUMINE 529 MG/ML IV SOLN
20.0000 mL | Freq: Once | INTRAVENOUS | Status: AC | PRN
  Administered 2016-12-22: 20 mL via INTRAVENOUS

## 2017-01-01 ENCOUNTER — Other Ambulatory Visit: Payer: Self-pay | Admitting: Nurse Practitioner

## 2017-01-01 DIAGNOSIS — M1711 Unilateral primary osteoarthritis, right knee: Secondary | ICD-10-CM

## 2017-01-04 ENCOUNTER — Other Ambulatory Visit: Payer: Self-pay

## 2017-01-04 ENCOUNTER — Telehealth: Payer: Self-pay

## 2017-01-04 DIAGNOSIS — R7989 Other specified abnormal findings of blood chemistry: Secondary | ICD-10-CM

## 2017-01-04 DIAGNOSIS — R945 Abnormal results of liver function studies: Principal | ICD-10-CM

## 2017-01-04 NOTE — Telephone Encounter (Signed)
Called patient to ask if she would come by the office to have LFTs repeated per MMMs request. Patient advised that she would come by before the end of the week. Orders placed in computer

## 2017-01-05 ENCOUNTER — Other Ambulatory Visit: Payer: BC Managed Care – PPO

## 2017-01-05 DIAGNOSIS — R7989 Other specified abnormal findings of blood chemistry: Secondary | ICD-10-CM

## 2017-01-05 DIAGNOSIS — R945 Abnormal results of liver function studies: Principal | ICD-10-CM

## 2017-01-06 ENCOUNTER — Telehealth: Payer: Self-pay | Admitting: Nurse Practitioner

## 2017-01-06 DIAGNOSIS — Z7901 Long term (current) use of anticoagulants: Secondary | ICD-10-CM

## 2017-01-06 LAB — HEPATIC FUNCTION PANEL
ALK PHOS: 126 IU/L — AB (ref 39–117)
ALT: 110 IU/L — ABNORMAL HIGH (ref 0–32)
AST: 107 IU/L — ABNORMAL HIGH (ref 0–40)
Albumin: 4.4 g/dL (ref 3.6–4.8)
Bilirubin Total: 0.3 mg/dL (ref 0.0–1.2)
Bilirubin, Direct: 0.1 mg/dL (ref 0.00–0.40)
TOTAL PROTEIN: 7.3 g/dL (ref 6.0–8.5)

## 2017-01-06 MED ORDER — WARFARIN SODIUM 10 MG PO TABS: 10.0000 mg | ORAL_TABLET | Freq: Every day | ORAL | 5 refills | Status: DC

## 2017-01-06 NOTE — Telephone Encounter (Signed)
pt threw away her warfarin by mistake and needed refill

## 2017-02-01 ENCOUNTER — Ambulatory Visit (INDEPENDENT_AMBULATORY_CARE_PROVIDER_SITE_OTHER): Payer: BC Managed Care – PPO | Admitting: Pharmacist

## 2017-02-01 ENCOUNTER — Other Ambulatory Visit: Payer: Self-pay | Admitting: Nurse Practitioner

## 2017-02-01 DIAGNOSIS — M1711 Unilateral primary osteoarthritis, right knee: Secondary | ICD-10-CM

## 2017-02-01 DIAGNOSIS — R7989 Other specified abnormal findings of blood chemistry: Secondary | ICD-10-CM | POA: Diagnosis not present

## 2017-02-01 DIAGNOSIS — Z7901 Long term (current) use of anticoagulants: Secondary | ICD-10-CM

## 2017-02-01 DIAGNOSIS — Z86718 Personal history of other venous thrombosis and embolism: Secondary | ICD-10-CM | POA: Diagnosis not present

## 2017-02-01 DIAGNOSIS — R945 Abnormal results of liver function studies: Secondary | ICD-10-CM

## 2017-02-01 LAB — COAGUCHEK XS/INR WAIVED
INR: 4.8 — ABNORMAL HIGH (ref 0.9–1.1)
Prothrombin Time: 57.1 s

## 2017-02-02 ENCOUNTER — Ambulatory Visit (INDEPENDENT_AMBULATORY_CARE_PROVIDER_SITE_OTHER): Payer: BC Managed Care – PPO | Admitting: Physician Assistant

## 2017-02-02 VITALS — BP 134/79 | HR 83 | Temp 98.3°F | Ht 68.0 in | Wt 229.4 lb

## 2017-02-02 DIAGNOSIS — M25561 Pain in right knee: Secondary | ICD-10-CM

## 2017-02-02 LAB — HEPATITIS PANEL, ACUTE
HEP A IGM: NEGATIVE
Hep B C IgM: NEGATIVE
Hep C Virus Ab: <0.1 s/co ratio (ref 0.0–0.9)
Hepatitis B Surface Ag: NEGATIVE

## 2017-02-02 NOTE — Progress Notes (Signed)
Subjective:     Patient ID: Christina [name], female   DOB: 1951/02/12, 66 y.o.   MRN: [id number]  HPI Pt with R knee pain Prev Xray have shown sig degen changes to the R knee Prev inj through Ortho in Jan States sx have been tolerable until now No locking or giving way  Review of Systems  Musculoskeletal: Positive for arthralgias, gait problem, joint swelling and myalgias.       Objective:   Physical Exam  Constitutional: She appears well-developed and well-nourished.  Nursing note and vitals reviewed. No ecchy noted to the R knee Sl effusion TTP of the lateral knee FROM of the knee w sx but no crepitus noted No laxity  Good strength Pulses/sensory good distal Offered injection Consent obtained Under sterile technique 1.5cc Mar 1.5 cc [place] today Dressing placed    Assessment:     1. Right knee pain, unspecified chronicity        Plan:     Pt to get her Neoprene sleeve and compression hose Continue OTC meds prn Heat/Ice F/U here prn

## 2017-02-02 NOTE — Patient Instructions (Signed)

## 2017-02-03 ENCOUNTER — Other Ambulatory Visit: Payer: Self-pay | Admitting: *Deleted

## 2017-02-03 DIAGNOSIS — R945 Abnormal results of liver function studies: Principal | ICD-10-CM

## 2017-02-03 DIAGNOSIS — R7989 Other specified abnormal findings of blood chemistry: Secondary | ICD-10-CM

## 2017-02-07 ENCOUNTER — Encounter (INDEPENDENT_AMBULATORY_CARE_PROVIDER_SITE_OTHER): Payer: Self-pay | Admitting: Internal Medicine

## 2017-02-09 ENCOUNTER — Ambulatory Visit (INDEPENDENT_AMBULATORY_CARE_PROVIDER_SITE_OTHER): Payer: BC Managed Care – PPO | Admitting: Pharmacist

## 2017-02-09 ENCOUNTER — Telehealth: Payer: Self-pay | Admitting: Pharmacist

## 2017-02-09 DIAGNOSIS — Z7901 Long term (current) use of anticoagulants: Secondary | ICD-10-CM

## 2017-02-09 DIAGNOSIS — Z86718 Personal history of other venous thrombosis and embolism: Secondary | ICD-10-CM | POA: Diagnosis not present

## 2017-02-09 LAB — COAGUCHEK XS/INR WAIVED
INR: 2.2 — ABNORMAL HIGH (ref 0.9–1.1)
Prothrombin Time: 26.4 s

## 2017-02-09 NOTE — Telephone Encounter (Signed)
appt has been schedule - patient notified at appt today.  Given information about location of GI office.

## 2017-02-20 ENCOUNTER — Encounter (INDEPENDENT_AMBULATORY_CARE_PROVIDER_SITE_OTHER): Payer: Self-pay | Admitting: Internal Medicine

## 2017-02-20 ENCOUNTER — Ambulatory Visit (INDEPENDENT_AMBULATORY_CARE_PROVIDER_SITE_OTHER): Payer: BC Managed Care – PPO | Admitting: Internal Medicine

## 2017-02-20 VITALS — BP 158/80 | HR 64 | Temp 98.0°F | Ht 69.5 in | Wt 227.4 lb

## 2017-02-20 DIAGNOSIS — R748 Abnormal levels of other serum enzymes: Secondary | ICD-10-CM | POA: Diagnosis not present

## 2017-02-20 NOTE — Progress Notes (Signed)
Subjective:    Patient ID: Christina [name], female    DOB: 1951/04/08, 66 y.o.   MRN: [id number]   HPI Referred by Ronnald Collum FNP for elevated liver enzymes. 02/01/2017 Acute hepatitis panel negative. Hepatic function in 2017 was normal.  On Benicar and Norvasc for about a year.  Appetite is good. No weight loss. Has a BM daily. No gi complaints.   Hepatic Function Latest Ref Rng & Units 01/05/2017 12/12/2016 12/12/2016  Total Protein 6.0 - 8.5 g/dL 7.3 6.6 6.9  Albumin 3.6 - 4.8 g/dL 4.4 4.1 4.1  AST 0 - 40 IU/L 107(H) 151(H) 155(H)  ALT 0 - 32 IU/L 110(H) 169(H) 178(H)  Alk Phosphatase 39 - 117 IU/L 126(H) 139(H) 139(H)  Total Bilirubin 0.0 - 1.2 mg/dL 0.3 <0.2 0.2  Bilirubin, Direct 0.00 - 0.40 mg/dL 0.10 0.08 -   Maintained on Warfarin for DVT and PE (rt leg)  12/23/2016 MR abdomen: Possible rt renal lesion  Hepatobiliary: Liver is within normal limits. No suspicious/enhancing hepatic lesions.  Gallbladder is unremarkable. No intrahepatic or extrahepatic ductal Dilatation. 12/16/2016 US abdomen: elevated liver enzymes Liver: No focal lesion identified. Within normal limits in parenchymal echogenicity.    '   Review of Systems Past Medical History:  Diagnosis Date  . Allergic rhinitis   . Clotting disorder ([place])   . DVT (deep venous thrombosis) ([place]) 1990's  . DVT (deep venous thrombosis) ([place])   . Epicondylitis    right   . GERD (gastroesophageal reflux disease)   . Hyperlipidemia   . Pulmonary embolism ([place]) 1990's    Past Surgical History:  Procedure Laterality Date  . CYSTECTOMY     From shoulder and neck  . TUBAL LIGATION     Multiple    Allergies  Allergen Reactions  . Ace Inhibitors     Cough      Current Outpatient Prescriptions on File Prior to Visit  Medication Sig Dispense Refill  . amLODipine (NORVASC) 5 MG tablet Take 1 tablet (5 mg total) by mouth daily. 30 tablet 5  . atorvastatin (LIPITOR) 40 MG tablet TAKE 1 TABLET (40 MG TOTAL)  BY MOUTH DAILY. 30 tablet 5  . Cholecalciferol (VITAMIN D-3) 5000 UNITS TABS Take 1 capsule by mouth daily.    . meloxicam (MOBIC) 15 MG tablet TAKE 1 TABLET BY MOUTH EVERY DAY 30 tablet 0  . olmesartan-hydrochlorothiazide (BENICAR HCT) 40-12.5 MG tablet Take 1 tablet by mouth daily. 30 tablet 5  . omeprazole (PRILOSEC) 40 MG capsule Take 1 capsule (40 mg total) by mouth daily. (Patient taking differently: Take 40 mg by mouth as needed. ) 30 capsule 3  . warfarin (COUMADIN) 10 MG tablet Take 1 tablet (10 mg total) by mouth daily. 30 tablet 5  . Elastic Bandages & Supports (V-2 HIGH COMPRESSION HOSE) MISC Wear daily 1 each 0   No current facility-administered medications on file prior to visit.         Objective:   Physical Exam Blood pressure (!) 158/80, pulse 64, temperature 98 F (36.7 C), height 5' 9.5" (1.765 m), weight 227 lb 6.4 oz (103.1 kg). Alert and oriented. Skin warm and dry. Oral mucosa is moist.   . Sclera anicteric, conjunctivae is pink. Thyroid not enlarged. No cervical lymphadenopathy. Lungs clear. Heart regular rate and rhythm.  Abdomen is soft. Bowel sounds are positive. No hepatomegaly. No abdominal masses felt. No tenderness.  No edema to lower extremities. Patient is alert and oriented.  Assessment & Plan:  Elevated liverenzymes. Am going to get alpha 1, ceruloplasmin, igg, ANA, ferritin, hepatic

## 2017-02-20 NOTE — Patient Instructions (Signed)
OV in 6 months. 

## 2017-02-21 LAB — ANTI-NUCLEAR AB-TITER (ANA TITER)

## 2017-02-21 LAB — ANA: ANA: POSITIVE — AB

## 2017-02-21 LAB — IGG: IGG (IMMUNOGLOBIN G), SERUM: 1044 mg/dL (ref 694–1618)

## 2017-02-21 LAB — HEPATIC FUNCTION PANEL
ALK PHOS: 115 U/L (ref 33–130)
ALT: 60 U/L — ABNORMAL HIGH (ref 6–29)
AST: 61 U/L — AB (ref 10–35)
Albumin: 3.9 g/dL (ref 3.6–5.1)
BILIRUBIN INDIRECT: 0.4 mg/dL (ref 0.2–1.2)
BILIRUBIN TOTAL: 0.5 mg/dL (ref 0.2–1.2)
Bilirubin, Direct: 0.1 mg/dL (ref <=0.2)
Total Protein: 6.8 g/dL (ref 6.1–8.1)

## 2017-02-21 LAB — FERRITIN: FERRITIN: 66 ng/mL (ref 20–288)

## 2017-02-22 LAB — ALPHA-1-ANTITRYPSIN: A-1 Antitrypsin, Ser: 193 mg/dL (ref 83–199)

## 2017-02-22 LAB — CERULOPLASMIN: Ceruloplasmin: 35 mg/dL (ref 18–53)

## 2017-03-01 ENCOUNTER — Ambulatory Visit (INDEPENDENT_AMBULATORY_CARE_PROVIDER_SITE_OTHER): Payer: BC Managed Care – PPO | Admitting: Pharmacist

## 2017-03-01 DIAGNOSIS — Z86718 Personal history of other venous thrombosis and embolism: Secondary | ICD-10-CM | POA: Diagnosis not present

## 2017-03-01 DIAGNOSIS — Z7901 Long term (current) use of anticoagulants: Secondary | ICD-10-CM | POA: Diagnosis not present

## 2017-03-01 LAB — COAGUCHEK XS/INR WAIVED
INR: 3.7 — ABNORMAL HIGH (ref 0.9–1.1)
Prothrombin Time: 44.8 s

## 2017-03-01 NOTE — Patient Instructions (Signed)
Anticoagulation Warfarin Dose Instructions as of 03/01/2017      Christina SmilesSun Mon Tue Wed Thu Fri Sat   New Dose 5 mg 10 mg 5 mg 5 mg 5 mg 10 mg 5 mg    Description   Skip 1 dose of warfarin (tomorrow - July 19th since already taken today's dose) Then continue warfarin 10mg  tablet - take 1 tablet on Mondays and Fridays.  Take 1/2 tablet all other days.  INR 3.7 today (goal is 2.0 to 3.0)

## 2017-03-02 ENCOUNTER — Other Ambulatory Visit: Payer: Self-pay | Admitting: Nurse Practitioner

## 2017-03-02 DIAGNOSIS — M1711 Unilateral primary osteoarthritis, right knee: Secondary | ICD-10-CM

## 2017-03-13 ENCOUNTER — Encounter (INDEPENDENT_AMBULATORY_CARE_PROVIDER_SITE_OTHER): Payer: Self-pay | Admitting: *Deleted

## 2017-03-13 ENCOUNTER — Telehealth (INDEPENDENT_AMBULATORY_CARE_PROVIDER_SITE_OTHER): Payer: Self-pay | Admitting: Internal Medicine

## 2017-03-13 ENCOUNTER — Other Ambulatory Visit (INDEPENDENT_AMBULATORY_CARE_PROVIDER_SITE_OTHER): Payer: Self-pay | Admitting: *Deleted

## 2017-03-13 DIAGNOSIS — R748 Abnormal levels of other serum enzymes: Secondary | ICD-10-CM

## 2017-03-13 NOTE — Telephone Encounter (Signed)
Patient called, thinks she's supposed to be having labs done, but I do not see a letter.  [phone]

## 2017-03-13 NOTE — Telephone Encounter (Signed)
Patient is to have lab work on 03/16/2017. A letter has been sent to her also. I did call her and left a voicemail on her phone.

## 2017-03-16 LAB — HEPATIC FUNCTION PANEL
ALT: 60 U/L — ABNORMAL HIGH (ref 6–29)
AST: 68 U/L — ABNORMAL HIGH (ref 10–35)
Albumin: 3.9 g/dL (ref 3.6–5.1)
Alkaline Phosphatase: 105 U/L (ref 33–130)
Bilirubin, Direct: 0.1 mg/dL (ref <=0.2)
Indirect Bilirubin: 0.3 mg/dL (ref 0.2–1.2)
TOTAL PROTEIN: 6.5 g/dL (ref 6.1–8.1)
Total Bilirubin: 0.4 mg/dL (ref 0.2–1.2)

## 2017-03-17 LAB — SEDIMENTATION RATE: SED RATE: 4 mm/h (ref 0–30)

## 2017-03-20 ENCOUNTER — Other Ambulatory Visit (INDEPENDENT_AMBULATORY_CARE_PROVIDER_SITE_OTHER): Payer: Self-pay | Admitting: *Deleted

## 2017-03-20 DIAGNOSIS — R748 Abnormal levels of other serum enzymes: Secondary | ICD-10-CM

## 2017-03-22 ENCOUNTER — Ambulatory Visit (INDEPENDENT_AMBULATORY_CARE_PROVIDER_SITE_OTHER): Payer: BC Managed Care – PPO | Admitting: Pharmacist

## 2017-03-22 DIAGNOSIS — Z86718 Personal history of other venous thrombosis and embolism: Secondary | ICD-10-CM | POA: Diagnosis not present

## 2017-03-22 DIAGNOSIS — Z7901 Long term (current) use of anticoagulants: Secondary | ICD-10-CM | POA: Diagnosis not present

## 2017-03-22 LAB — COAGUCHEK XS/INR WAIVED
INR: 4.1 — ABNORMAL HIGH (ref 0.9–1.1)
PROTHROMBIN TIME: 48.9 s

## 2017-03-22 NOTE — Patient Instructions (Signed)
Anticoagulation Warfarin Dose Instructions as of 03/22/2017      Glynis SmilesSun Mon Tue Wed Thu Fri Sat   New Dose 5 mg 10 mg 5 mg 5 mg 5 mg 5 mg 5 mg    Description   No warfarin today - Wednesday 8/8 or Thursday 8/9.  Then start new warfarin dose - take 10mg  = 1 tablet on Mondays only.  Take 5mg  = 1/2 tablet all other days.  INR 4.1 today (goal is 2.0 to 3.0)

## 2017-03-29 ENCOUNTER — Encounter: Payer: Self-pay | Admitting: Pharmacist

## 2017-03-29 ENCOUNTER — Ambulatory Visit (INDEPENDENT_AMBULATORY_CARE_PROVIDER_SITE_OTHER): Payer: BC Managed Care – PPO | Admitting: Pharmacist

## 2017-03-29 DIAGNOSIS — Z7901 Long term (current) use of anticoagulants: Secondary | ICD-10-CM

## 2017-03-29 DIAGNOSIS — Z86718 Personal history of other venous thrombosis and embolism: Secondary | ICD-10-CM | POA: Diagnosis not present

## 2017-03-29 LAB — COAGUCHEK XS/INR WAIVED
INR: 2.2 — ABNORMAL HIGH (ref 0.9–1.1)
Prothrombin Time: 26.8 s

## 2017-03-29 NOTE — Patient Instructions (Signed)
Anticoagulation Warfarin Dose Instructions as of 03/29/2017      Glynis SmilesSun Mon Tue Wed Thu Fri Sat   New Dose 5 mg 10 mg 5 mg 5 mg 5 mg 5 mg 5 mg    Description   Continue current warfarin 10mg  dose - take 10mg  = 1 tablet on Mondays only.  Take 5mg  = 1/2 tablet all other days.  INR 2.2 today (goal is 2.0 to 3.0)

## 2017-03-30 ENCOUNTER — Encounter: Payer: Self-pay | Admitting: Pharmacist

## 2017-04-03 ENCOUNTER — Telehealth: Payer: Self-pay | Admitting: Nurse Practitioner

## 2017-04-05 NOTE — Telephone Encounter (Signed)
Pt aware that referral was placed and to be expecting a call from [facility]. Pt informed Dr. Darrelyn Hillock will be here September 20.

## 2017-04-20 ENCOUNTER — Telehealth: Payer: Self-pay | Admitting: Nurse Practitioner

## 2017-04-20 NOTE — Telephone Encounter (Signed)
What symptoms do you have? Leg pain hurts to walk on  How long have you been sick? For awhile  Have you been seen for this problem? yes  If your provider decides to give you a prescription, which pharmacy would you like for it to be sent to? [facility] in [place]   Patient informed that this information will be sent to the clinical staff for review and that they should receive a follow up call.

## 2017-04-20 NOTE — Telephone Encounter (Signed)
Please advise 

## 2017-04-20 NOTE — Telephone Encounter (Signed)
ntbs

## 2017-04-21 ENCOUNTER — Ambulatory Visit (INDEPENDENT_AMBULATORY_CARE_PROVIDER_SITE_OTHER): Payer: BC Managed Care – PPO | Admitting: Nurse Practitioner

## 2017-04-21 ENCOUNTER — Encounter: Payer: Self-pay | Admitting: Nurse Practitioner

## 2017-04-21 VITALS — BP 115/74 | HR 78 | Temp 97.6°F | Ht 69.0 in | Wt 235.0 lb

## 2017-04-21 DIAGNOSIS — M25561 Pain in right knee: Secondary | ICD-10-CM

## 2017-04-21 DIAGNOSIS — G8929 Other chronic pain: Secondary | ICD-10-CM | POA: Diagnosis not present

## 2017-04-21 NOTE — Progress Notes (Signed)
   Subjective:    Patient ID: Christina [name], female    DOB: [birth year], 66 y.o.   MRN: [id number]  HPI Patient comes in today c/o right knee pain. Says it has been hurting for 1 month. Painful to walk on. Saw [facility] about 3 months ago and had a cortisone shot in it. He was dx with osteoarthritis.    Review of Systems  Constitutional: Negative.   Respiratory: Negative.   Cardiovascular: Negative.   Genitourinary: Negative.   Musculoskeletal: Positive for joint swelling.  Neurological: Negative.   Psychiatric/Behavioral: Negative.   All other systems reviewed and are negative.      Objective:   Physical Exam  Constitutional: She is oriented to person, place, and time. She appears well-developed and well-nourished. No distress.  Cardiovascular: Normal rate and regular rhythm.   Pulmonary/Chest: Effort normal and breath sounds normal.  Musculoskeletal:  FROM of right knee without pain Mild effusion No patella tenderness All ligaments intact  Neurological: She is alert and oriented to person, place, and time.  Skin: Skin is warm.  Psychiatric: She has a normal mood and affect. Her behavior is normal. Judgment and thought content normal.    BP 115/74   Pulse 78   Temp 97.6 F (36.4 C) (Oral)   Ht 5\' 9"  (1.753 m)   Wt 235 lb (106.6 kg)   BMI 34.70 kg/m        Assessment & Plan:   1. Chronic pain of right knee    Orders Placed This Encounter  Procedures  . Ambulatory referral to Orthopedic Surgery    Referral Priority:   Routine    Referral Type:   Surgical    Referral Reason:   Specialty Services Required    Requested Specialty:   Orthopedic Surgery    Number of Visits Requested:   1   Ice bid Elevate when sitting Tylenol OTC  Mary-Margaret Daphine DeutscherMartin, FNP

## 2017-04-21 NOTE — Telephone Encounter (Signed)
Apt made for today with MMM

## 2017-04-21 NOTE — Patient Instructions (Signed)

## 2017-04-28 ENCOUNTER — Ambulatory Visit (INDEPENDENT_AMBULATORY_CARE_PROVIDER_SITE_OTHER): Payer: BC Managed Care – PPO | Admitting: Pharmacist Clinician (PhC)/ Clinical Pharmacy Specialist

## 2017-04-28 DIAGNOSIS — Z86718 Personal history of other venous thrombosis and embolism: Secondary | ICD-10-CM | POA: Diagnosis not present

## 2017-04-28 DIAGNOSIS — Z7901 Long term (current) use of anticoagulants: Secondary | ICD-10-CM

## 2017-04-28 LAB — COAGUCHEK XS/INR WAIVED
INR: 1.7 — ABNORMAL HIGH (ref 0.9–1.1)
PROTHROMBIN TIME: 20.9 s

## 2017-04-28 NOTE — Patient Instructions (Signed)
Anticoagulation Warfarin Dose Instructions as of 04/28/2017      Christina [name] Tue Wed Thu Fri Sat   New Dose 5 mg 10 mg 5 mg 5 mg 5 mg 5 mg 5 mg    Description   Take 1 tablet today and then tomorrow go back to your regular schedule  INR 1.7 today (goal is 2.0 to 3.0)

## 2017-05-01 ENCOUNTER — Telehealth: Payer: Self-pay | Admitting: Nurse Practitioner

## 2017-05-11 ENCOUNTER — Other Ambulatory Visit (INDEPENDENT_AMBULATORY_CARE_PROVIDER_SITE_OTHER): Payer: Self-pay | Admitting: *Deleted

## 2017-05-11 ENCOUNTER — Encounter (INDEPENDENT_AMBULATORY_CARE_PROVIDER_SITE_OTHER): Payer: Self-pay | Admitting: *Deleted

## 2017-05-11 DIAGNOSIS — R748 Abnormal levels of other serum enzymes: Secondary | ICD-10-CM

## 2017-05-18 LAB — HEPATIC FUNCTION PANEL
AG RATIO: 1.6 (calc) (ref 1.0–2.5)
ALBUMIN MSPROF: 3.8 g/dL (ref 3.6–5.1)
ALKALINE PHOSPHATASE (APISO): 76 U/L (ref 33–130)
ALT: 40 U/L — ABNORMAL HIGH (ref 6–29)
AST: 41 U/L — AB (ref 10–35)
BILIRUBIN DIRECT: 0.1 mg/dL (ref 0.0–0.2)
BILIRUBIN INDIRECT: 0.3 mg/dL (ref 0.2–1.2)
GLOBULIN: 2.4 g/dL (ref 1.9–3.7)
TOTAL PROTEIN: 6.2 g/dL (ref 6.1–8.1)
Total Bilirubin: 0.4 mg/dL (ref 0.2–1.2)

## 2017-06-09 ENCOUNTER — Ambulatory Visit (INDEPENDENT_AMBULATORY_CARE_PROVIDER_SITE_OTHER): Payer: BC Managed Care – PPO | Admitting: *Deleted

## 2017-06-09 DIAGNOSIS — Z86718 Personal history of other venous thrombosis and embolism: Secondary | ICD-10-CM | POA: Diagnosis not present

## 2017-06-09 DIAGNOSIS — Z7901 Long term (current) use of anticoagulants: Secondary | ICD-10-CM | POA: Diagnosis not present

## 2017-06-09 DIAGNOSIS — Z23 Encounter for immunization: Secondary | ICD-10-CM | POA: Diagnosis not present

## 2017-06-09 LAB — COAGUCHEK XS/INR WAIVED
INR: 3.2 — AB (ref 0.9–1.1)
Prothrombin Time: 38.7 s

## 2017-06-09 NOTE — Progress Notes (Signed)
Subjective:     Indication: DVT Bleeding signs/symptoms: None Thromboembolic signs/symptoms: None  Missed Coumadin doses: None Medication changes: yes - had Aleve 3 times last week for knee pain. Knows that Tylenol is preferred Dietary changes: no Bacterial/viral infection: no Other concerns: no  The following portions of the patient's history were reviewed and updated as appropriate: allergies and current medications.  Review of Systems Pertinent items are noted in HPI.   Objective:    INR Today: 3.2 Current dose:10 mg on Monday and 5mg  all other days    Assessment:    Supratherapeutic INR for goal of 2-3   Plan:    1. New dose: Hold next dose and then resume normal schedule of 10mg  on Mondays and 5mg  all other days.    2. Next INR: 1 month    Discussed with Bennie PieriniMary Margaret Martin, FNP  Demetrios LollKristen Sherrina Zaugg, RN

## 2017-06-09 NOTE — Patient Instructions (Signed)
Anticoagulation Warfarin Dose Instructions as of 06/09/2017      Christina SmilesSun Mon Tue Wed Thu Fri Sat   New Dose 5 mg 10 mg 5 mg 5 mg 5 mg 5 mg 5 mg    Description   Hold tomorrow's dose and then start back on your regular schedule of 1 tablet on Monday and 1/2 tablet all other days.   INR 3.2 today (goal is 2.0 to 3.0)  Return in 1 month

## 2017-07-01 ENCOUNTER — Other Ambulatory Visit: Payer: Self-pay | Admitting: Nurse Practitioner

## 2017-07-01 DIAGNOSIS — E785 Hyperlipidemia, unspecified: Secondary | ICD-10-CM

## 2017-07-04 ENCOUNTER — Ambulatory Visit: Payer: BC Managed Care – PPO | Admitting: *Deleted

## 2017-07-04 DIAGNOSIS — Z86718 Personal history of other venous thrombosis and embolism: Secondary | ICD-10-CM

## 2017-07-04 DIAGNOSIS — Z7901 Long term (current) use of anticoagulants: Secondary | ICD-10-CM

## 2017-07-04 LAB — COAGUCHEK XS/INR WAIVED
INR: 1.8 — AB (ref 0.9–1.1)
PROTHROMBIN TIME: 21.4 s

## 2017-07-04 NOTE — Progress Notes (Addendum)
Subjective:     Indication: DVT Bleeding signs/symptoms: None Thromboembolic signs/symptoms: None  Missed Coumadin doses: None Medication changes: Has not been taking Aleve for the past week Dietary changes: no Bacterial/viral infection: no Other concerns: no  The following portions of the patient's history were reviewed and updated as appropriate: allergies and current medications.  Review of Systems Pertinent items are noted in HPI.   Objective:    INR Today: 1.8 Current dose:   10mg  on Sun and 5mg  all other days. She has taken her dose for today.   Assessment:    Subtherapeutic INR for goal of 2-3   Plan:    1. New dose: 10mg  tomorrow (Wednesday) and the resume normal schedule   2. Next INR: 2 weeks    Christina LollKristen Rashi Granier, RN Consulted with Christina Daphine DeutscherMartin, FNP  Christina Daphine DeutscherMartin, FNP

## 2017-07-19 ENCOUNTER — Ambulatory Visit: Payer: BC Managed Care – PPO | Admitting: *Deleted

## 2017-07-19 DIAGNOSIS — Z7901 Long term (current) use of anticoagulants: Secondary | ICD-10-CM | POA: Diagnosis not present

## 2017-07-19 DIAGNOSIS — Z86718 Personal history of other venous thrombosis and embolism: Secondary | ICD-10-CM | POA: Diagnosis not present

## 2017-07-19 LAB — COAGUCHEK XS/INR WAIVED
INR: 2.2 — AB (ref 0.9–1.1)
PROTHROMBIN TIME: 26.8 s

## 2017-07-31 NOTE — Progress Notes (Signed)
Subjective:     Indication: DVT Bleeding signs/symptoms: None Thromboembolic signs/symptoms: None  Missed Coumadin doses: None Medication changes: no Dietary changes: no Bacterial/viral infection: no Other concerns: no  The following portions of the patient's history were reviewed and updated as appropriate: allergies and current medications.  Review of Systems Pertinent items are noted in HPI.   Objective:    INR Today: 2.2 Current dose: 10 mg on Sun and 5mg  all other days  Assessment:    Therapeutic INR for goal of 2-3   Plan:    1. New dose: no change   2. Next INR: 1 month    Demetrios LollKristen Mona Ayars, RN  07/19/17

## 2017-08-03 ENCOUNTER — Other Ambulatory Visit: Payer: Self-pay | Admitting: Nurse Practitioner

## 2017-08-03 DIAGNOSIS — E785 Hyperlipidemia, unspecified: Secondary | ICD-10-CM

## 2017-08-16 ENCOUNTER — Ambulatory Visit: Payer: BC Managed Care – PPO | Admitting: Nurse Practitioner

## 2017-08-16 ENCOUNTER — Encounter: Payer: Self-pay | Admitting: Nurse Practitioner

## 2017-08-16 DIAGNOSIS — Z86718 Personal history of other venous thrombosis and embolism: Secondary | ICD-10-CM

## 2017-08-16 DIAGNOSIS — Z7901 Long term (current) use of anticoagulants: Secondary | ICD-10-CM | POA: Diagnosis not present

## 2017-08-16 LAB — COAGUCHEK XS/INR WAIVED
INR: 2.1 — ABNORMAL HIGH (ref 0.9–1.1)
PROTHROMBIN TIME: 25.5 s

## 2017-08-16 NOTE — Progress Notes (Signed)
Subjective:   Patient in today for INR check.   Indication: DVT Bleeding signs/symptoms: None Thromboembolic signs/symptoms: None  Missed Coumadin doses: None Medication changes: no Dietary changes: no Bacterial/viral infection: no Other concerns: no  The following portions of the patient's history were reviewed and updated as appropriate: allergies, current medications, past family history, past medical history, past social history, past surgical history and problem list.  Review of Systems Pertinent items noted in HPI and remainder of comprehensive ROS otherwise negative.   Objective:    INR Today: 2.1 Current dose: coumadin 5mg  daily except 10mg  on mondays    Assessment:    Therapeutic INR for goal of 2-3   Plan:    1. New dose: no change   2. Next INR: 1 month    Mary-Margaret Daphine DeutscherMartin, FNP

## 2017-08-23 ENCOUNTER — Encounter (INDEPENDENT_AMBULATORY_CARE_PROVIDER_SITE_OTHER): Payer: Self-pay | Admitting: Internal Medicine

## 2017-08-23 ENCOUNTER — Ambulatory Visit (INDEPENDENT_AMBULATORY_CARE_PROVIDER_SITE_OTHER): Payer: BC Managed Care – PPO | Admitting: Internal Medicine

## 2017-08-23 VITALS — BP 140/84 | HR 80 | Temp 98.0°F | Ht 69.5 in | Wt 222.0 lb

## 2017-08-23 DIAGNOSIS — R748 Abnormal levels of other serum enzymes: Secondary | ICD-10-CM

## 2017-08-23 LAB — HEPATIC FUNCTION PANEL
AG Ratio: 1.6 (calc) (ref 1.0–2.5)
ALT: 58 U/L — AB (ref 6–29)
AST: 56 U/L — ABNORMAL HIGH (ref 10–35)
Albumin: 4 g/dL (ref 3.6–5.1)
Alkaline phosphatase (APISO): 89 U/L (ref 33–130)
BILIRUBIN INDIRECT: 0.4 mg/dL (ref 0.2–1.2)
BILIRUBIN TOTAL: 0.5 mg/dL (ref 0.2–1.2)
Bilirubin, Direct: 0.1 mg/dL (ref 0.0–0.2)
GLOBULIN: 2.5 g/dL (ref 1.9–3.7)
TOTAL PROTEIN: 6.5 g/dL (ref 6.1–8.1)

## 2017-08-23 NOTE — Progress Notes (Signed)
Subjective:    Patient ID: Christina [name], female    DOB: 09-21-1950, 67 y.o.   MRN: [id number]  HPI Here today for f/u. Last seen in July 2017 for elevated liver enzymes. Wt in July was 227.  Acute Hepatitis Panel negative.  Hepatic function in 2017 was normal.  02/20/2017 alpha 1 antitrypsin normal. Ceruloplasmin normal. IgG 1,044, ANA speckled.  Ferritin 66, ANA positive Liver numbers have come down significantly.  She has lost 5 pounds since her visit. She is not on any new medicine. She is not exercising. Her left leg is bothering her. Has been on same b/p for a long time.  Her appetite is good.  BMs are normal. No melena or BRRB.  Lipid panel 05/20/2016 was normal.  No drugs or IV drug    Maintained on Warfarin for DVT and PE (rt leg)  12/23/2016 MR abdomen: Possible rt renal lesion  Hepatobiliary: Liver is within normal limits. No suspicious/enhancing hepatic lesions.  Gallbladder is unremarkable. No intrahepatic or extrahepatic ductal Dilatation. 12/16/2016 US abdomen: elevated liver enzymes Liver: No focal lesion identified. Within normal limits in parenchymal echogenicity.     CMP Latest Ref Rng & Units 05/18/2017 03/16/2017 02/20/2017  Glucose 65 - 99 mg/dL - - -  BUN 8 - 27 mg/dL - - -  Creatinine 4.09 - 1.00 mg/dL - - -  Sodium 811 - 914 mmol/L - - -  Potassium 3.5 - 5.2 mmol/L - - -  Chloride 96 - 106 mmol/L - - -  CO2 18 - 29 mmol/L - - -  Calcium 8.7 - 10.3 mg/dL - - -  Total Protein 6.1 - 8.1 g/dL 6.2 6.5 6.8  Total Bilirubin 0.2 - 1.2 mg/dL 0.4 0.4 0.5  Alkaline Phos 33 - 130 U/L - 105 115  AST 10 - 35 U/L 41(H) 68(H) 61(H)  ALT 6 - 29 U/L 40(H) 60(H) 60(H)    Review of Systems Past Medical History:  Diagnosis Date  . Allergic rhinitis   . Clotting disorder (HCC)   . DVT (deep venous thrombosis) (HCC) 1990's  . DVT (deep venous thrombosis) (HCC)   . Epicondylitis    right   . GERD (gastroesophageal reflux disease)   . Hyperlipidemia   .  Pulmonary embolism (HCC) 1990's    Past Surgical History:  Procedure Laterality Date  . CYSTECTOMY     From shoulder and neck  . TUBAL LIGATION     Multiple    Allergies  Allergen Reactions  . Ace Inhibitors     Cough      Current Outpatient Medications on File Prior to Visit  Medication Sig Dispense Refill  . amLODipine (NORVASC) 5 MG tablet Take 1 tablet (5 mg total) by mouth daily. 30 tablet 5  . atorvastatin (LIPITOR) 40 MG tablet TAKE 1 TABLET BY MOUTH EVERY DAY 30 tablet 0  . Cholecalciferol (VITAMIN D-3) 5000 UNITS TABS Take 1 capsule by mouth daily.    Marland Kitchen olmesartan-hydrochlorothiazide (BENICAR HCT) 40-12.5 MG tablet Take 1 tablet by mouth daily. 30 tablet 5  . omeprazole (PRILOSEC) 40 MG capsule Take 1 capsule (40 mg total) by mouth daily. (Patient taking differently: Take 40 mg by mouth as needed. ) 30 capsule 3  . warfarin (COUMADIN) 10 MG tablet Take 1 tablet (10 mg total) by mouth daily. 30 tablet 5   No current facility-administered medications on file prior to visit.         Objective:   Physical Exam Blood pressure  140/84, pulse 80, temperature 98 F (36.7 C), height 5' 9.5" (1.765 m), weight 222 lb (100.7 kg). Alert and oriented. Skin warm and dry. Oral mucosa is moist.   . Sclera anicteric, conjunctivae is pink. Thyroid not enlarged. No cervical lymphadenopathy. Lungs clear. Heart regular rate and rhythm.  Abdomen is soft. Bowel sounds are positive. No hepatomegaly. No abdominal masses felt. No tenderness.  No edema to lower extremities.           Assessment & Plan:  Elevated liver enzymes. Her numbers have come down significantly. Will repeat today. She will diet diet and exercise.

## 2017-08-23 NOTE — Patient Instructions (Signed)
Labs today. OV in 6 months.  

## 2017-08-28 ENCOUNTER — Other Ambulatory Visit (INDEPENDENT_AMBULATORY_CARE_PROVIDER_SITE_OTHER): Payer: Self-pay | Admitting: *Deleted

## 2017-08-28 DIAGNOSIS — R748 Abnormal levels of other serum enzymes: Secondary | ICD-10-CM

## 2017-09-01 ENCOUNTER — Encounter: Payer: Self-pay | Admitting: Nurse Practitioner

## 2017-09-01 ENCOUNTER — Ambulatory Visit: Payer: BC Managed Care – PPO | Admitting: Nurse Practitioner

## 2017-09-01 ENCOUNTER — Ambulatory Visit (INDEPENDENT_AMBULATORY_CARE_PROVIDER_SITE_OTHER): Payer: BC Managed Care – PPO

## 2017-09-01 ENCOUNTER — Other Ambulatory Visit: Payer: Self-pay | Admitting: Nurse Practitioner

## 2017-09-01 VITALS — BP 120/69 | HR 71 | Temp 97.2°F | Ht 69.0 in | Wt 221.0 lb

## 2017-09-01 DIAGNOSIS — G8929 Other chronic pain: Secondary | ICD-10-CM | POA: Diagnosis not present

## 2017-09-01 DIAGNOSIS — M25561 Pain in right knee: Secondary | ICD-10-CM

## 2017-09-01 DIAGNOSIS — I10 Essential (primary) hypertension: Secondary | ICD-10-CM

## 2017-09-01 DIAGNOSIS — M1711 Unilateral primary osteoarthritis, right knee: Secondary | ICD-10-CM | POA: Diagnosis not present

## 2017-09-01 MED ORDER — TRAMADOL HCL 50 MG PO TABS: 50.0000 mg | ORAL_TABLET | Freq: Three times a day (TID) | ORAL | 0 refills | Status: DC | PRN

## 2017-09-01 NOTE — Progress Notes (Signed)
   Subjective:    Patient ID: Christina [name], female    DOB: 06/26/1951, 67 y.o.   MRN: [id number]  HPI  Patient comes in c/o right knee pain. She saw ortho in November and had shot injected in it and that only helped for a couple of days. She has been taking arthritis strength tylenol which makes pain manageable.   Review of Systems  Respiratory: Negative.   Cardiovascular: Negative.   Musculoskeletal: Positive for arthralgias (right knee) and joint swelling.  Neurological: Negative.   Psychiatric/Behavioral: Negative.   All other systems reviewed and are negative.      Objective:   Physical Exam  Constitutional: She is oriented to person, place, and time. She appears well-developed and well-nourished. No distress.  Cardiovascular: Normal rate and regular rhythm.  Pulmonary/Chest: Effort normal and breath sounds normal.  Musculoskeletal:  Right knee pain on flexion and extension Crepitus with movement All ligaments intact  Neurological: She is alert and oriented to person, place, and time.  Skin: Skin is warm.  Psychiatric: She has a normal mood and affect. Her behavior is normal. Judgment and thought content normal.   BP 120/69   Pulse 71   Temp (!) 97.2 F (36.2 C) (Oral)   Ht 5\' 9"  (1.753 m)   Wt 221 lb (100.2 kg)   BMI 32.64 kg/m   Right knee xray- osteoarthritis of right knee- Mary-Margaret Daphine DeutscherMartin, FNP       Assessment & Plan:  1. Chronic pain of right knee - DG Knee 1-2 Views Right; Future  2. Primary osteoarthritis of right knee Rest Ice bid Compression Elevate when sitting Follow up with ortho in february - traMADol (ULTRAM) 50 MG tablet; Take 1 tablet (50 mg total) by mouth every 8 (eight) hours as needed.  Dispense: 40 tablet; Refill: 0  Mary-Margaret Daphine DeutscherMartin, FNP

## 2017-09-01 NOTE — Patient Instructions (Signed)
Elastic Bandage and RICE What does an elastic bandage do? Elastic bandages come in different shapes and sizes. They generally provide support to your injury and reduce swelling while you are healing, but they can perform different functions. Your health care provider will help you to decide what is best for your protection, recovery, or rehabilitation following an injury. What are some general tips for using an elastic bandage?  Use the bandage as directed by the maker of the bandage that you are using.  Do not wrap the bandage too tightly. This may cut off the circulation in the arm or leg in the area below the bandage. ? If part of your body beyond the bandage becomes blue, numb, cold, swollen, or is more painful, your bandage is most likely too tight. If this occurs, remove your bandage and reapply it more loosely.  See your health care provider if the bandage seems to be making your problems worse rather than better.  An elastic bandage should be removed and reapplied every 3-4 hours or as directed by your health care provider. What is RICE? The routine care of many injuries includes rest, ice, compression, and elevation (RICE therapy). Rest Rest is required to allow your body to heal. Generally, you can resume your routine activities when you are comfortable and have been given permission by your health care provider. Ice Icing your injury helps to keep the swelling down and it reduces pain. Do not apply ice directly to your skin.  Put ice in a plastic bag.  Place a towel between your skin and the bag.  Leave the ice on for 20 minutes, 2-3 times per day.  Do this for as long as you are directed by your health care provider. Compression Compression helps to keep swelling down, gives support, and helps with discomfort. Compression may be done with an elastic bandage. Elevation Elevation helps to reduce swelling and it decreases pain. If possible, your injured area should be placed at  or above the level of your heart or the center of your chest. When should I seek medical care? You should seek medical care if:  You have persistent pain and swelling.  Your symptoms are getting worse rather than improving.  These symptoms may indicate that further evaluation or further X-rays are needed. Sometimes, X-rays may not show a small broken bone (fracture) until a number of days later. Make a follow-up appointment with your health care provider. Ask when your X-ray results will be ready. Make sure that you get your X-ray results. When should I seek immediate medical care? You should seek immediate medical care if:  You have a sudden onset of severe pain at or below the area of your injury.  You develop redness or increased swelling around your injury.  You have tingling or numbness at or below the area of your injury that does not improve after you remove the elastic bandage.  This information is not intended to replace advice given to you by your health care provider. Make sure you discuss any questions you have with your health care provider. Document Released: 01/21/2002 Document Revised: 06/27/2016 Document Reviewed: 03/17/2014 Elsevier Interactive Patient Education  2018 [employer]  

## 2017-09-02 ENCOUNTER — Other Ambulatory Visit: Payer: Self-pay | Admitting: Nurse Practitioner

## 2017-09-02 DIAGNOSIS — E785 Hyperlipidemia, unspecified: Secondary | ICD-10-CM

## 2017-09-06 ENCOUNTER — Other Ambulatory Visit: Payer: Self-pay | Admitting: Nurse Practitioner

## 2017-09-06 DIAGNOSIS — E785 Hyperlipidemia, unspecified: Secondary | ICD-10-CM

## 2017-09-08 ENCOUNTER — Other Ambulatory Visit (INDEPENDENT_AMBULATORY_CARE_PROVIDER_SITE_OTHER): Payer: Self-pay | Admitting: *Deleted

## 2017-09-08 ENCOUNTER — Encounter (INDEPENDENT_AMBULATORY_CARE_PROVIDER_SITE_OTHER): Payer: Self-pay | Admitting: *Deleted

## 2017-09-08 DIAGNOSIS — R748 Abnormal levels of other serum enzymes: Secondary | ICD-10-CM

## 2017-09-25 ENCOUNTER — Ambulatory Visit: Payer: BC Managed Care – PPO | Admitting: Nurse Practitioner

## 2017-09-25 ENCOUNTER — Encounter: Payer: Self-pay | Admitting: Nurse Practitioner

## 2017-09-25 DIAGNOSIS — Z86718 Personal history of other venous thrombosis and embolism: Secondary | ICD-10-CM

## 2017-09-25 DIAGNOSIS — Z7901 Long term (current) use of anticoagulants: Secondary | ICD-10-CM | POA: Diagnosis not present

## 2017-09-25 LAB — COAGUCHEK XS/INR WAIVED
INR: 2.3 — AB (ref 0.9–1.1)
Prothrombin Time: 27.5 s

## 2017-09-25 NOTE — Addendum Note (Signed)
Addended by: Bennie PieriniMARTIN, MARY-MARGARET on: 09/25/2017 04:16 PM   Modules accepted: Level of Service

## 2017-09-25 NOTE — Progress Notes (Signed)
Subjective:   Patient comes in today for INR only   Indication: DVT Bleeding signs/symptoms: None Thromboembolic signs/symptoms: None  Missed Coumadin doses: None Medication changes: no Dietary changes: no Bacterial/viral infection: no Other concerns: no  The following portions of the patient's history were reviewed and updated as appropriate: allergies, current medications, past family history, past medical history, past social history, past surgical history and problem list.  Review of Systems Pertinent items noted in HPI and remainder of comprehensive ROS otherwise negative.   Objective:    INR Today: 2.3 Current dose: coumadin 5mg  daily except 10mg  on mondays    Assessment:    Therapeutic INR for goal of 2-3   Plan:    1. New dose: no change   2. Next INR: 1 month    Mary-Margaret Daphine DeutscherMartin, FNP

## 2017-09-28 ENCOUNTER — Other Ambulatory Visit: Payer: Self-pay | Admitting: Nurse Practitioner

## 2017-09-28 DIAGNOSIS — I10 Essential (primary) hypertension: Secondary | ICD-10-CM

## 2017-10-01 ENCOUNTER — Other Ambulatory Visit: Payer: Self-pay | Admitting: Nurse Practitioner

## 2017-10-01 DIAGNOSIS — E785 Hyperlipidemia, unspecified: Secondary | ICD-10-CM

## 2017-10-02 NOTE — Telephone Encounter (Signed)
Last refill without being seen 

## 2017-10-02 NOTE — Telephone Encounter (Signed)
Last lipid 4.30.18 MMM

## 2017-10-11 DIAGNOSIS — M25561 Pain in right knee: Secondary | ICD-10-CM

## 2017-10-11 DIAGNOSIS — M25562 Pain in left knee: Secondary | ICD-10-CM | POA: Insufficient documentation

## 2017-10-15 ENCOUNTER — Other Ambulatory Visit: Payer: Self-pay | Admitting: Nurse Practitioner

## 2017-10-15 DIAGNOSIS — E785 Hyperlipidemia, unspecified: Secondary | ICD-10-CM

## 2017-10-23 ENCOUNTER — Ambulatory Visit: Payer: BC Managed Care – PPO | Admitting: Nurse Practitioner

## 2017-10-23 ENCOUNTER — Encounter: Payer: Self-pay | Admitting: Nurse Practitioner

## 2017-10-23 DIAGNOSIS — Z7901 Long term (current) use of anticoagulants: Secondary | ICD-10-CM | POA: Diagnosis not present

## 2017-10-23 DIAGNOSIS — Z86718 Personal history of other venous thrombosis and embolism: Secondary | ICD-10-CM | POA: Diagnosis not present

## 2017-10-23 LAB — COAGUCHEK XS/INR WAIVED
INR: 2.8 — AB (ref 0.9–1.1)
PROTHROMBIN TIME: 33.3 s

## 2017-10-23 NOTE — Progress Notes (Signed)
Subjective:   patient in today for INR only.   Indication: DVT Bleeding signs/symptoms: None Thromboembolic signs/symptoms: None  Missed Coumadin doses: None Medication changes: no Dietary changes: no Bacterial/viral infection: no Other concerns: no  The following portions of the patient's history were reviewed and updated as appropriate: allergies, current medications, past family history, past medical history, past social history, past surgical history and problem list.  Review of Systems Pertinent items noted in HPI and remainder of comprehensive ROS otherwise negative.   Objective:    INR Today: 2.8 Current dose: coumadin 5mg  daily except 10mg  on     Assessment:    Therapeutic INR for goal of 2-3   Plan:    1. New dose: no change   2. Next INR: 1 month    Mary-Margaret Daphine DeutscherMartin, FNP

## 2017-10-23 NOTE — Addendum Note (Signed)
Addended by: Cleda DaubUCKER, Benford Asch G on: 10/23/2017 04:34 PM   Modules accepted: Orders

## 2017-11-07 ENCOUNTER — Other Ambulatory Visit: Payer: Self-pay | Admitting: Nurse Practitioner

## 2017-11-07 DIAGNOSIS — Z86718 Personal history of other venous thrombosis and embolism: Secondary | ICD-10-CM

## 2017-11-07 DIAGNOSIS — M25561 Pain in right knee: Secondary | ICD-10-CM

## 2017-11-07 NOTE — Telephone Encounter (Signed)
[facility]

## 2017-11-07 NOTE — Telephone Encounter (Signed)
Patient aware via voicemail 

## 2017-11-14 ENCOUNTER — Other Ambulatory Visit: Payer: Self-pay | Admitting: Nurse Practitioner

## 2017-11-14 DIAGNOSIS — E785 Hyperlipidemia, unspecified: Secondary | ICD-10-CM

## 2017-11-14 NOTE — Telephone Encounter (Signed)
Last lipid 11/15/16  MMM

## 2017-12-21 ENCOUNTER — Telehealth: Payer: Self-pay | Admitting: Nurse Practitioner

## 2017-12-21 ENCOUNTER — Encounter: Payer: Self-pay | Admitting: Family

## 2017-12-21 ENCOUNTER — Ambulatory Visit: Payer: BC Managed Care – PPO | Admitting: Family

## 2017-12-21 VITALS — BP 109/70 | HR 90 | Temp 98.6°F | Ht 69.0 in | Wt 212.0 lb

## 2017-12-21 DIAGNOSIS — B9689 Other specified bacterial agents as the cause of diseases classified elsewhere: Secondary | ICD-10-CM | POA: Diagnosis not present

## 2017-12-21 DIAGNOSIS — J208 Acute bronchitis due to other specified organisms: Secondary | ICD-10-CM | POA: Diagnosis not present

## 2017-12-21 MED ORDER — PREDNISONE 10 MG (21) PO TBPK: ORAL_TABLET | ORAL | 0 refills | Status: DC

## 2017-12-21 MED ORDER — AZITHROMYCIN 250 MG PO TABS: ORAL_TABLET | ORAL | 0 refills | Status: DC

## 2017-12-21 NOTE — Progress Notes (Addendum)
   Subjective:    Patient ID: Christina [name], female    DOB: May 11, 1951, 67 y.o.   MRN: [id number]  Chief Complaint  Patient presents with  . Cough    productive cough x 1 week    Cough  This is a new problem. The current episode started 1 to 4 weeks ago. The problem has been unchanged. The problem occurs every few minutes. The cough is productive of sputum. Associated symptoms include chills, ear congestion, ear pain, nasal congestion, postnasal drip, rhinorrhea ("a little bit"), a sore throat, shortness of breath and wheezing. Pertinent negatives include no chest pain, fever, headaches or myalgias. The symptoms are aggravated by lying down. She has tried rest and OTC cough suppressant for the symptoms. The treatment provided mild relief.      Review of Systems  Constitutional: Positive for chills. Negative for fever.  HENT: Positive for ear pain, postnasal drip, rhinorrhea ("a little bit") and sore throat.   Respiratory: Positive for cough, shortness of breath and wheezing.   Cardiovascular: Negative for chest pain.  Musculoskeletal: Negative for myalgias.  Neurological: Negative for headaches.  All other systems reviewed and are negative.      Objective:   Physical Exam  Constitutional: She is oriented to person, place, and time. She appears well-developed and well-nourished. No distress.  HENT:  Head: Normocephalic and atraumatic.  Right Ear: External ear normal.  Nose: Mucosal edema and rhinorrhea present.  Mouth/Throat: Posterior oropharyngeal erythema present.  Eyes: Pupils are equal, round, and reactive to light.  Neck: Normal range of motion. Neck supple. No thyromegaly present.  Cardiovascular: Normal rate, regular rhythm, normal heart sounds and intact distal pulses.  No murmur heard. Pulmonary/Chest: Effort normal and breath sounds normal. No respiratory distress. She has no wheezes.  Coarse nonproductive cough  Abdominal: Soft. Bowel sounds are normal. She exhibits  no distension. There is no tenderness.  Musculoskeletal: Normal range of motion. She exhibits no edema or tenderness.  Neurological: She is alert and oriented to person, place, and time. She has normal reflexes.  Skin: Skin is warm and dry.  Psychiatric: She has a normal mood and affect. Her behavior is normal. Judgment and thought content normal.  Vitals reviewed.     BP 109/70 (BP Location: Left Arm, Patient Position: Sitting, Cuff Size: Large)   Pulse 90   Temp 98.6 F (37 C) (Oral)   Ht  (1.753 m)   Wt 212 lb (96.2 kg)   SpO2 99%   BMI 31.31 kg/m      Assessment & Plan:  Saul was seen today for cough.  Diagnoses and all orders for this visit:  Acute bacterial bronchitis -     predniSONE (STERAPRED UNI-PAK 21 TAB) 10 MG (21) TBPK tablet; Use as directed -     azithromycin (ZITHROMAX) 250 MG tablet; Take 500 mg once, then 250 mg for four days    - Take meds as prescribed - Use a cool mist humidifier  -Use saline nose sprays frequently -Force fluids -For any cough or congestion  Use plain Mucinex- regular strength or max strength is fine -For fever or aces or pains- take tylenol or ibuprofen. -Throat lozenges if help -New toothbrush in 3 days -RTO in 2 weeks to recheck INR  Jannifer Rodney, FNP

## 2017-12-21 NOTE — Patient Instructions (Signed)

## 2017-12-22 ENCOUNTER — Other Ambulatory Visit: Payer: Self-pay | Admitting: Nurse Practitioner

## 2017-12-22 DIAGNOSIS — E785 Hyperlipidemia, unspecified: Secondary | ICD-10-CM

## 2017-12-22 NOTE — Telephone Encounter (Signed)
OV 01/04/18 

## 2018-01-04 ENCOUNTER — Ambulatory Visit: Payer: BC Managed Care – PPO | Admitting: Nurse Practitioner

## 2018-01-04 ENCOUNTER — Encounter: Payer: Self-pay | Admitting: Nurse Practitioner

## 2018-01-04 VITALS — BP 111/70 | HR 78 | Temp 97.0°F | Ht 69.0 in | Wt 211.0 lb

## 2018-01-04 DIAGNOSIS — Z86718 Personal history of other venous thrombosis and embolism: Secondary | ICD-10-CM | POA: Diagnosis not present

## 2018-01-04 DIAGNOSIS — Z7901 Long term (current) use of anticoagulants: Secondary | ICD-10-CM

## 2018-01-04 LAB — COAGUCHEK XS/INR WAIVED
INR: 3.4 — ABNORMAL HIGH (ref 0.9–1.1)
PROTHROMBIN TIME: 40.2 s

## 2018-01-04 NOTE — Progress Notes (Signed)
Subjective:   CHIEF COMPLAINT:  INR recheck   Indication: DVT Bleeding signs/symptoms: None Thromboembolic signs/symptoms: None  Missed Coumadin doses: None Medication changes: no Dietary changes: no Bacterial/viral infection: no Other concerns: no  The following portions of the patient's history were reviewed and updated as appropriate: allergies, current medications, past family history, past medical history, past social history, past surgical history and problem list.  Review of Systems Pertinent items noted in HPI and remainder of comprehensive ROS otherwise negative.   Objective:    INR Today: 3.4 Current dose: coumadin  daily except  on mondays    Assessment:    Supratherapeutic INR for goal of 2-3   Plan:    1. New dose: hold tomorrow and then back on regular dose   2. Next INR: 1 month    Mary-Margaret Daphine Deutscher, FNP

## 2018-01-09 ENCOUNTER — Other Ambulatory Visit: Payer: Self-pay | Admitting: Nurse Practitioner

## 2018-01-09 DIAGNOSIS — Z7901 Long term (current) use of anticoagulants: Secondary | ICD-10-CM

## 2018-02-05 ENCOUNTER — Ambulatory Visit: Payer: BC Managed Care – PPO | Admitting: Nurse Practitioner

## 2018-02-05 ENCOUNTER — Encounter: Payer: Self-pay | Admitting: Nurse Practitioner

## 2018-02-05 VITALS — BP 121/74 | HR 86 | Temp 97.6°F | Ht 69.0 in | Wt 211.4 lb

## 2018-02-05 DIAGNOSIS — K219 Gastro-esophageal reflux disease without esophagitis: Secondary | ICD-10-CM

## 2018-02-05 DIAGNOSIS — I10 Essential (primary) hypertension: Secondary | ICD-10-CM | POA: Diagnosis not present

## 2018-02-05 DIAGNOSIS — E785 Hyperlipidemia, unspecified: Secondary | ICD-10-CM

## 2018-02-05 DIAGNOSIS — Z6831 Body mass index (BMI) 31.0-31.9, adult: Secondary | ICD-10-CM | POA: Diagnosis not present

## 2018-02-05 DIAGNOSIS — Z86718 Personal history of other venous thrombosis and embolism: Secondary | ICD-10-CM

## 2018-02-05 DIAGNOSIS — Z7901 Long term (current) use of anticoagulants: Secondary | ICD-10-CM | POA: Diagnosis not present

## 2018-02-05 LAB — COAGUCHEK XS/INR WAIVED
INR: 2.3 — AB (ref 0.9–1.1)
Prothrombin Time: 27 s

## 2018-02-05 MED ORDER — AMLODIPINE BESYLATE 5 MG PO TABS: 5.0000 mg | ORAL_TABLET | Freq: Every day | ORAL | 1 refills | Status: DC

## 2018-02-05 MED ORDER — WARFARIN SODIUM 10 MG PO TABS: 10.0000 mg | ORAL_TABLET | Freq: Every day | ORAL | 1 refills | Status: DC

## 2018-02-05 MED ORDER — ATORVASTATIN CALCIUM 40 MG PO TABS: 40.0000 mg | ORAL_TABLET | Freq: Every day | ORAL | 1 refills | Status: DC

## 2018-02-05 MED ORDER — OMEPRAZOLE 40 MG PO CPDR: 40.0000 mg | DELAYED_RELEASE_CAPSULE | Freq: Every day | ORAL | 1 refills | Status: DC

## 2018-02-05 MED ORDER — OLMESARTAN MEDOXOMIL-HCTZ 40-12.5 MG PO TABS: 1.0000 | ORAL_TABLET | Freq: Every day | ORAL | 1 refills | Status: DC

## 2018-02-05 NOTE — Progress Notes (Signed)
Subjective:    Patient ID: Christina [name], female    DOB: 08-13-1951, 67 y.o.   MRN: [id number]   Chief Complaint: Medical Management of Chronic Issues   HPI:  1. Hyperlipidemia with target LDL less than 100  Does not watch diet and does very little exercise.  2. Chronic anticoagulation  See anticoag note  3. Essential hypertension, benign  No c/o chest pain, sob or headache. Does not check blood pressure at home. BP Readings from Last 3 Encounters:  02/05/18 121/74  01/04/18 111/70  12/21/17 109/70     4. On anticoagulant therapy  No bleeding noted  5. History of DVT of lower extremity  No recent dvt bit has had several so is going to remain on coumadin.  6. BMI 35.0-35.9,adult  Patient has gradually lost weight over the last year    Outpatient Encounter Medications as of 02/05/2018  Medication Sig  . amLODipine (NORVASC) 5 MG tablet TAKE 1 TABLET BY MOUTH EVERY DAY  . atorvastatin (LIPITOR) 40 MG tablet TAKE 1 TABLET BY MOUTH EVERY DAY  . Cholecalciferol (VITAMIN D-3) 5000 UNITS TABS Take 1 capsule by mouth daily.  Marland Kitchen olmesartan-hydrochlorothiazide (BENICAR HCT) 40-12.5 MG tablet TAKE 1 TABLET BY MOUTH EVERY DAY  . omeprazole (PRILOSEC) 40 MG capsule Take 1 capsule (40 mg total) by mouth daily. (Patient taking differently: Take 40 mg by mouth as needed. )  . traMADol (ULTRAM) 50 MG tablet Take 1 tablet (50 mg total) by mouth every 8 (eight) hours as needed.  . warfarin (COUMADIN) 10 MG tablet TAKE 1 TABLET BY MOUTH EVERY DAY       New complaints: None today  Social history: Lives with husband. 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   Review of Systems  Constitutional: Negative for activity change and appetite change.  HENT: Negative.   Eyes: Negative for pain.  Respiratory: Negative for shortness of breath.   Cardiovascular: Negative for chest pain, palpitations and leg swelling.  Gastrointestinal: Negative for abdominal pain.    Endocrine: Negative for polydipsia.  Genitourinary: Negative.   Skin: Negative for rash.  Neurological: Negative for dizziness, weakness and headaches.  Hematological: Does not bruise/bleed easily.  Psychiatric/Behavioral: Negative.   All other systems reviewed and are negative.      Objective:   Physical Exam  Constitutional: She is oriented to person, place, and time. She appears well-developed and well-nourished. No distress.  HENT:  Head: Normocephalic.  Nose: Nose normal.  Mouth/Throat: Oropharynx is clear and moist.  Eyes: Pupils are equal, round, and reactive to light. EOM are normal.  Neck: Normal range of motion. Neck supple. No JVD present. Carotid bruit is not present.  Cardiovascular: Normal rate, regular rhythm, normal heart sounds and intact distal pulses.  Pulmonary/Chest: Effort normal and breath sounds normal. No respiratory distress. She has no wheezes. She has no rales. She exhibits no tenderness.  Abdominal: Soft. Normal appearance, normal aorta and bowel sounds are normal. She exhibits no distension, no abdominal bruit, no pulsatile midline mass and no mass. There is no splenomegaly or hepatomegaly. There is no tenderness.  Musculoskeletal: Normal range of motion. She exhibits no edema.  Lymphadenopathy:    She has no cervical adenopathy.  Neurological: She is alert and oriented to person, place, and time. She has normal reflexes.  Skin: Skin is warm and dry.  Psychiatric: She has a normal mood and affect. Her behavior is normal. Judgment and thought content normal.  Nursing note and vitals  reviewed.  BP 121/74   Pulse 86   Temp 97.6 F (36.4 C) (Oral)   Ht '5\' 9"'  (1.753 m)   Wt 211 lb 6 oz (95.9 kg)   BMI 31.21 kg/m       Assessment & Plan:  Fedora Knisely comes in today with chief complaint of Medical Management of Chronic Issues   Diagnosis and orders addressed:  1. Hyperlipidemia with target LDL less than 100 Low fat diet - Lipid panel -  atorvastatin (LIPITOR) 40 MG tablet; Take 1 tablet (40 mg total) by mouth daily.  Dispense: 90 tablet; Refill: 1  2. Chronic anticoagulation Recheck inr in 1 month - CoaguChek XS/INR Waived  3. Essential hypertension, benign Low sodium diet - CMP14+EGFR - olmesartan-hydrochlorothiazide (BENICAR HCT) 40-12.5 MG tablet; Take 1 tablet by mouth daily.  Dispense: 90 tablet; Refill: 1 - amLODipine (NORVASC) 5 MG tablet; Take 1 tablet (5 mg total) by mouth daily.  Dispense: 90 tablet; Refill: 1  4. On anticoagulant therapy See anticoag documnetation - warfarin (COUMADIN) 10 MG tablet; Take 1 tablet (10 mg total) by mouth daily.  Dispense: 90 tablet; Refill: 1  5. History of DVT of lower extremity Watch for signs of DVT  6. BMI 31.0-31.9,adult Discussed diet and exercise for person with BMI >25 Will recheck weight in 3-6 months  7. Gastroesophageal reflux disease without esophagitis Avoid spicy foods Do not eat 2 hours prior to bedtime - omeprazole (PRILOSEC) 40 MG capsule; Take 1 capsule (40 mg total) by mouth daily.  Dispense: 90 capsule; Refill: 1   Labs pending Health Maintenance reviewed Diet and exercise encouraged  Follow up plan: 6 months   Mary-Margaret Hassell Done, FNP

## 2018-02-05 NOTE — Patient Instructions (Signed)

## 2018-02-05 NOTE — Progress Notes (Signed)
Subjective:   Chief complaint; INR recheck and fill out DMV form   Indication: DVT Bleeding signs/symptoms: None Thromboembolic signs/symptoms: None  Missed Coumadin doses: None Medication changes: no Dietary changes: no Bacterial/viral infection: no Other concerns: no  The following portions of the patient's history were reviewed and updated as appropriate: allergies, current medications, past family history, past medical history, past social history, past surgical history and problem list.  Review of Systems Pertinent items noted in HPI and remainder of comprehensive ROS otherwise negative.   Objective:    INR Today: 2.3 Current dose: coumadin 5mg  daily except 10mg  on monday    Assessment:    Therapeutic INR for goal of 2-3   Plan:    1. New dose: no change   2. Next INR: 1 month    DMV form filled out  Mary-Margaret Daphine DeutscherMartin, FNP

## 2018-02-06 LAB — CMP14+FE+CBC/D/PLT+TIBC+FER...
ALBUMIN: 4.4 g/dL (ref 3.6–4.8)
ALK PHOS: 156 IU/L — AB (ref 39–117)
ALT: 132 IU/L — ABNORMAL HIGH (ref 0–32)
AST: 191 IU/L — AB (ref 0–40)
Albumin/Globulin Ratio: 1.8 (ref 1.2–2.2)
BUN/Creatinine Ratio: 16 (ref 12–28)
BUN: 19 mg/dL (ref 8–27)
Bilirubin Total: 0.4 mg/dL (ref 0.0–1.2)
CHLORIDE: 107 mmol/L — AB (ref 96–106)
CO2: 19 mmol/L — AB (ref 20–29)
Calcium: 10.4 mg/dL — ABNORMAL HIGH (ref 8.7–10.3)
Creatinine, Ser: 1.17 mg/dL — ABNORMAL HIGH (ref 0.57–1.00)
GFR calc Af Amer: 55 mL/min/{1.73_m2} — ABNORMAL LOW (ref >59)
GFR calc non Af Amer: 48 mL/min/{1.73_m2} — ABNORMAL LOW (ref >59)
GLUCOSE: 88 mg/dL (ref 65–99)
Globulin, Total: 2.4 g/dL (ref 1.5–4.5)
POTASSIUM: 5.4 mmol/L — AB (ref 3.5–5.2)
Sodium: 143 mmol/L (ref 134–144)
Testosterone: 33 ng/dL (ref 3–41)
Total Protein: 6.8 g/dL (ref 6.0–8.5)

## 2018-02-06 LAB — SPECIMEN STATUS REPORT

## 2018-02-06 LAB — LIPID PANEL
CHOL/HDL RATIO: 2.8 ratio (ref 0.0–4.4)
Cholesterol, Total: 179 mg/dL (ref 100–199)
HDL: 64 mg/dL (ref >39)
LDL CALC: 90 mg/dL (ref 0–99)
TRIGLYCERIDES: 123 mg/dL (ref 0–149)
VLDL Cholesterol Cal: 25 mg/dL (ref 5–40)

## 2018-02-09 ENCOUNTER — Telehealth: Payer: Self-pay | Admitting: Nurse Practitioner

## 2018-02-09 NOTE — Telephone Encounter (Signed)
NA, NVM 

## 2018-02-20 ENCOUNTER — Ambulatory Visit (INDEPENDENT_AMBULATORY_CARE_PROVIDER_SITE_OTHER): Payer: BC Managed Care – PPO | Admitting: Internal Medicine

## 2018-02-20 ENCOUNTER — Encounter (INDEPENDENT_AMBULATORY_CARE_PROVIDER_SITE_OTHER): Payer: Self-pay | Admitting: Internal Medicine

## 2018-02-20 VITALS — BP 150/70 | HR 80 | Temp 98.1°F | Ht 69.0 in | Wt 211.7 lb

## 2018-02-20 DIAGNOSIS — R748 Abnormal levels of other serum enzymes: Secondary | ICD-10-CM | POA: Diagnosis not present

## 2018-02-20 NOTE — Progress Notes (Addendum)
Subjective:    Patient ID: Christina [name], female    DOB: [birth year], 67 y.o.   MRN: [id number]  HPI Here today for f/u. Last seen in January of this year. Weight 222 in January.  Hx of elevated liver enzymes. Enzymes in 2017 were normal.  Atorvastatin has been stopped since she has had such a jump in her enzymes. Stopped 02/06/2018. She has stopped Tylenol. She was taking 1 -2 a day. Appetite is good. Se has lost 11 pounds since her last visit. She is trying to exercise. Her rt knee bother her. BMs are normal  Acute Hepatitis Panel negative.  Hepatic function in 2017 was normal.  02/20/2017 alpha 1 antitrypsin normal. Ceruloplasmin normal. IgG 1,044, ANA speckled.  Ferritin 66, ANA positive, Acute hepatitis panel negative.   CMP Latest Ref Rng & Units 02/05/2018 08/23/2017 05/18/2017  Glucose 65 - 99 mg/dL 88 - -  BUN 8 - 27 mg/dL 19 - -  Creatinine 1.190.57 - 1.00 mg/dL 1.47(W1.17(H) - -  Sodium 295134 - 144 mmol/L 143 - -  Potassium 3.5 - 5.2 mmol/L 5.4(H) - -  Chloride 96 - 106 mmol/L 107(H) - -  CO2 20 - 29 mmol/L 19(L) - -  Calcium 8.7 - 10.3 mg/dL 10.4(H) - -  Total Protein 6.0 - 8.5 g/dL 6.8 6.5 6.2  Total Bilirubin 0.0 - 1.2 mg/dL 0.4 0.5 0.4  Alkaline Phos 39 - 117 IU/L 156(H) - -  AST 0 - 40 IU/L 191(H) 56(H) 41(H)  ALT 0 - 32 IU/L 132(H) 58(H) 40(H)     Maintained on Warfarin for DVT and PE (rt leg)  12/23/2016 MR abdomen: Possible rt renal lesion  Hepatobiliary: Liver is within normal limits. No suspicious/enhancing hepatic lesions.  Gallbladder is unremarkable. No intrahepatic or extrahepatic ductal Dilatation. 12/16/2016 US abdomen: elevated liver enzymes Liver: No focal lesion identified. Within normal limits in parenchymal echogenicity.   Review of Systems Past Medical History:  Diagnosis Date  . Allergic rhinitis   . Clotting disorder (HCC)   . DVT (deep venous thrombosis) (HCC) 1990's  . DVT (deep venous thrombosis) (HCC)   . Epicondylitis    right   . GERD  (gastroesophageal reflux disease)   . Hyperlipidemia   . Pulmonary embolism (HCC) 1990's    Past Surgical History:  Procedure Laterality Date  . CYSTECTOMY     From shoulder and neck  . TUBAL LIGATION     Multiple    Allergies  Allergen Reactions  . Ace Inhibitors     Cough      Current Outpatient Medications on File Prior to Visit  Medication Sig Dispense Refill  . amLODipine (NORVASC) 5 MG tablet Take 1 tablet (5 mg total) by mouth daily. 90 tablet 1  . Cholecalciferol (VITAMIN D-3) 5000 UNITS TABS Take 1 capsule by mouth daily.    Marland Kitchen. olmesartan-hydrochlorothiazide (BENICAR HCT) 40-12.5 MG tablet Take 1 tablet by mouth daily. 90 tablet 1  . omeprazole (PRILOSEC) 40 MG capsule Take 1 capsule (40 mg total) by mouth daily. 90 capsule 1  . traMADol (ULTRAM) 50 MG tablet Take 1 tablet (50 mg total) by mouth every 8 (eight) hours as needed. 40 tablet 0  . warfarin (COUMADIN) 10 MG tablet Take 1 tablet (10 mg total) by mouth daily. 90 tablet 1   No current facility-administered medications on file prior to visit.         Objective:   Physical Exam Blood pressure (!) 150/70, pulse 80, temperature 98.1 F (  36.7 C), height 5\' 9"  (1.753 m), weight 211 lb 11.2 oz (96 kg).  Alert and oriented. Skin warm and dry. Oral mucosa is moist.   . Sclera anicteric, conjunctivae is pink. Thyroid not enlarged. No cervical lymphadenopathy. Lungs clear. Heart regular rate and rhythm.  Abdomen is soft. Bowel sounds are positive. No hepatomegaly. No abdominal masses felt. No tenderness.  No edema to lower extremities.          Assessment & Plan:  Elevated liver enzymes. She has a bump in her enzymes. Am going to repeat  Hepatic and also repeat ANA and sedrate.  OV in 6 months.

## 2018-02-20 NOTE — Patient Instructions (Signed)
Labs today. OV in 6 months.  

## 2018-02-22 LAB — HEPATIC FUNCTION PANEL
AG RATIO: 1.6 (calc) (ref 1.0–2.5)
ALT: 152 U/L — ABNORMAL HIGH (ref 6–29)
AST: 185 U/L — ABNORMAL HIGH (ref 10–35)
Albumin: 3.9 g/dL (ref 3.6–5.1)
Alkaline phosphatase (APISO): 149 U/L — ABNORMAL HIGH (ref 33–130)
BILIRUBIN DIRECT: 0.1 mg/dL (ref 0.0–0.2)
Globulin: 2.4 g/dL (calc) (ref 1.9–3.7)
Indirect Bilirubin: 0.4 mg/dL (calc) (ref 0.2–1.2)
TOTAL PROTEIN: 6.3 g/dL (ref 6.1–8.1)
Total Bilirubin: 0.5 mg/dL (ref 0.2–1.2)

## 2018-02-22 LAB — ANTI-NUCLEAR AB-TITER (ANA TITER)

## 2018-02-22 LAB — SEDIMENTATION RATE: Sed Rate: 6 mm/h (ref 0–30)

## 2018-02-22 LAB — ANA: Anti Nuclear Antibody(ANA): POSITIVE — AB

## 2018-02-26 ENCOUNTER — Telehealth (INDEPENDENT_AMBULATORY_CARE_PROVIDER_SITE_OTHER): Payer: Self-pay | Admitting: Internal Medicine

## 2018-02-26 ENCOUNTER — Other Ambulatory Visit (INDEPENDENT_AMBULATORY_CARE_PROVIDER_SITE_OTHER): Payer: Self-pay | Admitting: *Deleted

## 2018-02-26 ENCOUNTER — Encounter (INDEPENDENT_AMBULATORY_CARE_PROVIDER_SITE_OTHER): Payer: Self-pay | Admitting: *Deleted

## 2018-02-26 DIAGNOSIS — R748 Abnormal levels of other serum enzymes: Secondary | ICD-10-CM

## 2018-02-26 NOTE — Telephone Encounter (Signed)
Tammy, Hepatic in 2 weeks. Please send letter 

## 2018-02-26 NOTE — Telephone Encounter (Signed)
Lab noted for 2 weeks and letter has been sent.

## 2018-03-08 ENCOUNTER — Ambulatory Visit: Payer: BC Managed Care – PPO | Admitting: Nurse Practitioner

## 2018-03-08 ENCOUNTER — Encounter: Payer: Self-pay | Admitting: Nurse Practitioner

## 2018-03-08 VITALS — BP 112/78 | HR 89 | Temp 97.3°F | Ht 69.0 in | Wt 210.0 lb

## 2018-03-08 DIAGNOSIS — Z7901 Long term (current) use of anticoagulants: Secondary | ICD-10-CM

## 2018-03-08 DIAGNOSIS — Z86718 Personal history of other venous thrombosis and embolism: Secondary | ICD-10-CM

## 2018-03-08 LAB — COAGUCHEK XS/INR WAIVED
INR: 2 — ABNORMAL HIGH (ref 0.9–1.1)
Prothrombin Time: 24.2 s

## 2018-03-08 NOTE — Progress Notes (Signed)
Subjective:   Chief Complaint: INR recheck   Indication: DVT Bleeding signs/symptoms: None Thromboembolic signs/symptoms: None  Missed Coumadin doses: None Medication changes: no Dietary changes: no Bacterial/viral infection: no Other concerns: no  The following portions of the patient's history were reviewed and updated as appropriate: allergies, current medications, past family history, past medical history, past social history, past surgical history and problem list.  Review of Systems Pertinent items noted in HPI and remainder of comprehensive ROS otherwise negative.   Objective:    INR Today: 2.0 Current dose: coumadin 5mg  daily except 10mg  on monday    Assessment:    Therapeutic INR for goal of 2-3   Plan:    1. New dose: no change   2. Next INR: 1 month    Mary-Margaret Daphine DeutscherMartin, FNP

## 2018-03-12 LAB — HEPATIC FUNCTION PANEL
AG Ratio: 1.7 (calc) (ref 1.0–2.5)
ALKALINE PHOSPHATASE (APISO): 136 U/L — AB (ref 33–130)
ALT: 141 U/L — AB (ref 6–29)
AST: 195 U/L — ABNORMAL HIGH (ref 10–35)
Albumin: 3.7 g/dL (ref 3.6–5.1)
BILIRUBIN INDIRECT: 0.3 mg/dL (ref 0.2–1.2)
BILIRUBIN TOTAL: 0.4 mg/dL (ref 0.2–1.2)
Bilirubin, Direct: 0.1 mg/dL (ref 0.0–0.2)
Globulin: 2.2 g/dL (calc) (ref 1.9–3.7)
Total Protein: 5.9 g/dL — ABNORMAL LOW (ref 6.1–8.1)

## 2018-03-28 ENCOUNTER — Telehealth (INDEPENDENT_AMBULATORY_CARE_PROVIDER_SITE_OTHER): Payer: Self-pay | Admitting: Internal Medicine

## 2018-03-28 DIAGNOSIS — R748 Abnormal levels of other serum enzymes: Secondary | ICD-10-CM

## 2018-03-28 NOTE — Telephone Encounter (Signed)
Christina [name], US abdomen 

## 2018-03-29 ENCOUNTER — Telehealth (INDEPENDENT_AMBULATORY_CARE_PROVIDER_SITE_OTHER): Payer: Self-pay | Admitting: Internal Medicine

## 2018-03-29 NOTE — Telephone Encounter (Signed)
Message left on answering machine concerning US RUQ

## 2018-03-29 NOTE — Telephone Encounter (Signed)
No answer - will try again later

## 2018-03-29 NOTE — Telephone Encounter (Signed)
US sch'd 04/04/18 at [phone] ([phone]), npo after midnight, left detailed message for patient

## 2018-03-29 NOTE — Telephone Encounter (Signed)
Patient returned your call.

## 2018-04-03 ENCOUNTER — Ambulatory Visit (HOSPITAL_COMMUNITY)
Admission: RE | Admit: 2018-04-03 | Discharge: 2018-04-03 | Disposition: A | Discharge status: Home / Self Care | Payer: BC Managed Care – PPO | Source: Ambulatory Visit | Attending: Internal Medicine | Admitting: Internal Medicine

## 2018-04-03 ENCOUNTER — Telehealth (INDEPENDENT_AMBULATORY_CARE_PROVIDER_SITE_OTHER): Payer: Self-pay | Admitting: Internal Medicine

## 2018-04-03 DIAGNOSIS — N289 Disorder of kidney and ureter, unspecified: Secondary | ICD-10-CM | POA: Diagnosis present

## 2018-04-03 DIAGNOSIS — R748 Abnormal levels of other serum enzymes: Secondary | ICD-10-CM | POA: Insufficient documentation

## 2018-04-03 NOTE — Telephone Encounter (Signed)
Tammy, hepatic in 2 weeks. Please send letter

## 2018-04-04 ENCOUNTER — Other Ambulatory Visit (INDEPENDENT_AMBULATORY_CARE_PROVIDER_SITE_OTHER): Payer: Self-pay | Admitting: *Deleted

## 2018-04-04 ENCOUNTER — Ambulatory Visit (HOSPITAL_COMMUNITY): Payer: BC Managed Care – PPO

## 2018-04-04 DIAGNOSIS — R748 Abnormal levels of other serum enzymes: Secondary | ICD-10-CM

## 2018-04-04 NOTE — Telephone Encounter (Signed)
Lab work is noted for 04/18/2018. Patient will be sent a letter as a reminder.

## 2018-04-06 ENCOUNTER — Other Ambulatory Visit (INDEPENDENT_AMBULATORY_CARE_PROVIDER_SITE_OTHER): Payer: Self-pay | Admitting: *Deleted

## 2018-04-06 ENCOUNTER — Encounter (INDEPENDENT_AMBULATORY_CARE_PROVIDER_SITE_OTHER): Payer: Self-pay | Admitting: *Deleted

## 2018-04-06 DIAGNOSIS — R748 Abnormal levels of other serum enzymes: Secondary | ICD-10-CM

## 2018-04-09 ENCOUNTER — Ambulatory Visit: Payer: BC Managed Care – PPO | Admitting: Nurse Practitioner

## 2018-04-09 ENCOUNTER — Encounter: Payer: Self-pay | Admitting: Nurse Practitioner

## 2018-04-09 VITALS — BP 117/74 | HR 96 | Temp 97.5°F | Ht 69.0 in | Wt 211.8 lb

## 2018-04-09 DIAGNOSIS — N281 Cyst of kidney, acquired: Secondary | ICD-10-CM

## 2018-04-09 DIAGNOSIS — Z86718 Personal history of other venous thrombosis and embolism: Secondary | ICD-10-CM

## 2018-04-09 DIAGNOSIS — Z7901 Long term (current) use of anticoagulants: Secondary | ICD-10-CM | POA: Diagnosis not present

## 2018-04-09 LAB — COAGUCHEK XS/INR WAIVED
INR: 2.6 — ABNORMAL HIGH (ref 0.9–1.1)
Prothrombin Time: 31.4 s

## 2018-04-09 NOTE — Progress Notes (Signed)
Subjective:   hief Complaint:  INR check   Indication: DVT Bleeding signs/symptoms: None Thromboembolic signs/symptoms: None  Missed Coumadin doses: None Medication changes: no Dietary changes: no Bacterial/viral infection: no Other concerns: no  The following portions of the patient's history were reviewed and updated as appropriate: allergies, current medications, past family history, past medical history, past social history, past surgical history and problem list.  Review of Systems Pertinent items noted in HPI and remainder of comprehensive ROS otherwise negative.   Objective:    INR Today: 2.6 Current dose: coumadin 5mg  daily  Except 10mg  on mnday    Assessment:    Therapeutic INR for goal of 2-3   Plan:    1. New dose: no change   2. Next INR: 1 month    Mary-Margaret Daphine DeutscherMartin, FNP

## 2018-04-10 ENCOUNTER — Other Ambulatory Visit: Payer: Self-pay | Admitting: Nurse Practitioner

## 2018-04-10 DIAGNOSIS — I10 Essential (primary) hypertension: Secondary | ICD-10-CM

## 2018-04-13 ENCOUNTER — Ambulatory Visit: Payer: BC Managed Care – PPO | Admitting: Nurse Practitioner

## 2018-04-13 ENCOUNTER — Encounter: Payer: Self-pay | Admitting: Nurse Practitioner

## 2018-04-13 VITALS — BP 125/78 | Temp 97.1°F | Ht 69.0 in | Wt 209.0 lb

## 2018-04-13 DIAGNOSIS — M10072 Idiopathic gout, left ankle and foot: Secondary | ICD-10-CM | POA: Diagnosis not present

## 2018-04-13 DIAGNOSIS — I10 Essential (primary) hypertension: Secondary | ICD-10-CM

## 2018-04-13 DIAGNOSIS — R609 Edema, unspecified: Secondary | ICD-10-CM | POA: Insufficient documentation

## 2018-04-13 MED ORDER — COLCHICINE 0.6 MG PO TABS: ORAL_TABLET | ORAL | 1 refills | Status: DC

## 2018-04-13 MED ORDER — OLMESARTAN MEDOXOMIL 40 MG PO TABS: 40.0000 mg | ORAL_TABLET | Freq: Every day | ORAL | 1 refills | Status: DC

## 2018-04-13 MED ORDER — FUROSEMIDE 20 MG PO TABS: 20.0000 mg | ORAL_TABLET | Freq: Every day | ORAL | 1 refills | Status: DC

## 2018-04-13 NOTE — Patient Instructions (Signed)

## 2018-04-13 NOTE — Progress Notes (Signed)
   Subjective:    Patient ID: Christina [name], female    DOB: 01-05-1951, 67 y.o.   MRN: [id number]   Chief Complaint: feet painful   HPI Patient comes in today c/o swelling bil feet. She has gone back to work and feet have been bother her since. heer left big toe is hurting to touch or walk on.   Review of Systems  Constitutional: Negative.   HENT: Negative.   Respiratory: Negative.   Cardiovascular: Negative.   Genitourinary: Negative.   Musculoskeletal:       Bil foot pain   Psychiatric/Behavioral: Negative.   All other systems reviewed and are negative.      Objective:   Physical Exam  Constitutional: She is oriented to person, place, and time. She appears well-developed and well-nourished. No distress.  HENT:  Head: Normocephalic.  Nose: Nose normal.  Mouth/Throat: Oropharynx is clear and moist.  Eyes: Pupils are equal, round, and reactive to light. EOM are normal.  Neck: Normal range of motion. Neck supple. No JVD present. Carotid bruit is not present.  Cardiovascular: Normal rate, regular rhythm, normal heart sounds and intact distal pulses.  Pulmonary/Chest: Effort normal and breath sounds normal. No respiratory distress. She has no wheezes. She has no rales. She exhibits no tenderness.  Abdominal: Soft. Normal appearance, normal aorta and bowel sounds are normal. She exhibits no distension, no abdominal bruit, no pulsatile midline mass and no mass. There is no splenomegaly or hepatomegaly. There is no tenderness.  Musculoskeletal: Normal range of motion. She exhibits edema (1 + edmea bil ankles).  Left big toe edema and tenderness  Lymphadenopathy:    She has no cervical adenopathy.  Neurological: She is alert and oriented to person, place, and time. She has normal reflexes.  Skin: Skin is warm and dry.  Psychiatric: She has a normal mood and affect. Her behavior is normal. Judgment and thought content normal.  Nursing note and vitals reviewed.   BP 125/78    Temp (!) 97.1 F (36.2 C) (Oral)   Ht '5\' 9"'$  (1.753 m)   Wt 209 lb (94.8 kg)   BMI 30.86 kg/m         Assessment & Plan:  Christina [name] in today with chief complaint of feet painful   1. Peripheral edema elevate legs when sitting Wear compression hose when up walking - furosemide (LASIX) 20 MG tablet; Take 1 tablet (20 mg total) by mouth daily.  Dispense: 90 tablet; Refill: 1 - CMP14+EGFR  2. Acute idiopathic gout involving toe of left foot Ice if helps - colchicine 0.6 MG tablet; 2 po at pain onset. May repeat 1 tablet in 1 hour if still hurting.  Dispense: 30 tablet; Refill: 1 - Uric acid  3. Essential hypertension, benign Changed to plain benicar- took HCTZ out so can go on lasix - olmesartan (BENICAR) 40 MG tablet; Take 1 tablet (40 mg total) by mouth daily.  Dispense: 90 tablet; Refill: 1  Follow up as needed  Mary-Margaret Hassell Done, FNP

## 2018-04-14 LAB — CMP14+EGFR
A/G RATIO: 1.9 (ref 1.2–2.2)
ALBUMIN: 4.1 g/dL (ref 3.6–4.8)
ALK PHOS: 125 IU/L — AB (ref 39–117)
ALT: 105 IU/L — ABNORMAL HIGH (ref 0–32)
AST: 127 IU/L — ABNORMAL HIGH (ref 0–40)
BUN / CREAT RATIO: 14 (ref 12–28)
BUN: 16 mg/dL (ref 8–27)
Bilirubin Total: 0.4 mg/dL (ref 0.0–1.2)
CO2: 25 mmol/L (ref 20–29)
CREATININE: 1.11 mg/dL — AB (ref 0.57–1.00)
Calcium: 10 mg/dL (ref 8.7–10.3)
Chloride: 105 mmol/L (ref 96–106)
GFR calc Af Amer: 59 mL/min/{1.73_m2} — ABNORMAL LOW (ref >59)
GFR, EST NON AFRICAN AMERICAN: 51 mL/min/{1.73_m2} — AB (ref >59)
GLOBULIN, TOTAL: 2.2 g/dL (ref 1.5–4.5)
Glucose: 94 mg/dL (ref 65–99)
POTASSIUM: 4.4 mmol/L (ref 3.5–5.2)
SODIUM: 144 mmol/L (ref 134–144)
Total Protein: 6.3 g/dL (ref 6.0–8.5)

## 2018-04-14 LAB — URIC ACID: URIC ACID: 9.7 mg/dL — AB (ref 2.5–7.1)

## 2018-04-18 ENCOUNTER — Telehealth: Payer: Self-pay | Admitting: Nurse Practitioner

## 2018-04-18 NOTE — Telephone Encounter (Signed)
Called.

## 2018-04-20 ENCOUNTER — Ambulatory Visit (HOSPITAL_COMMUNITY): Payer: BC Managed Care – PPO

## 2018-04-20 LAB — HEPATIC FUNCTION PANEL
AG RATIO: 1.6 (calc) (ref 1.0–2.5)
ALBUMIN MSPROF: 3.9 g/dL (ref 3.6–5.1)
ALT: 101 U/L — AB (ref 6–29)
AST: 148 U/L — ABNORMAL HIGH (ref 10–35)
Alkaline phosphatase (APISO): 101 U/L (ref 33–130)
BILIRUBIN TOTAL: 0.6 mg/dL (ref 0.2–1.2)
Bilirubin, Direct: 0.1 mg/dL (ref 0.0–0.2)
GLOBULIN: 2.5 g/dL (ref 1.9–3.7)
Indirect Bilirubin: 0.5 mg/dL (calc) (ref 0.2–1.2)
TOTAL PROTEIN: 6.4 g/dL (ref 6.1–8.1)

## 2018-05-11 ENCOUNTER — Ambulatory Visit (HOSPITAL_COMMUNITY)
Admission: RE | Admit: 2018-05-11 | Discharge: 2018-05-11 | Disposition: A | Discharge status: Home / Self Care | Payer: BC Managed Care – PPO | Source: Ambulatory Visit | Attending: Nurse Practitioner | Admitting: Nurse Practitioner

## 2018-05-11 DIAGNOSIS — N281 Cyst of kidney, acquired: Secondary | ICD-10-CM | POA: Diagnosis not present

## 2018-05-11 DIAGNOSIS — K449 Diaphragmatic hernia without obstruction or gangrene: Secondary | ICD-10-CM | POA: Diagnosis not present

## 2018-05-11 MED ORDER — GADOBUTROL 1 MMOL/ML IV SOLN
9.0000 mL | Freq: Once | INTRAVENOUS | Status: AC | PRN
  Administered 2018-05-11: 9 mL via INTRAVENOUS

## 2018-06-04 ENCOUNTER — Encounter: Payer: Self-pay | Admitting: Family

## 2018-06-04 ENCOUNTER — Encounter: Payer: BC Managed Care – PPO | Admitting: Family

## 2018-06-04 ENCOUNTER — Ambulatory Visit (INDEPENDENT_AMBULATORY_CARE_PROVIDER_SITE_OTHER): Payer: BC Managed Care – PPO | Admitting: Family

## 2018-06-04 VITALS — BP 120/79 | HR 81 | Temp 97.3°F | Ht 69.0 in | Wt 204.4 lb

## 2018-06-04 DIAGNOSIS — Z86718 Personal history of other venous thrombosis and embolism: Secondary | ICD-10-CM

## 2018-06-04 DIAGNOSIS — Z7901 Long term (current) use of anticoagulants: Secondary | ICD-10-CM

## 2018-06-04 DIAGNOSIS — Z23 Encounter for immunization: Secondary | ICD-10-CM

## 2018-06-04 LAB — COAGUCHEK XS/INR WAIVED
INR: 2.1 — ABNORMAL HIGH (ref 0.9–1.1)
Prothrombin Time: 25.5 s

## 2018-06-04 NOTE — Progress Notes (Signed)
   Subjective:    Patient ID: Christina [name], female    DOB: 28-May-1951, 67 y.o.   MRN: [id number]  Chief Complaint  Patient presents with  . protime recheck    HPI Pt presents to the office today for INR check. She has had a hx of DVT and PE. She is currently taking warfarin. See anticoagulation flow sheet.   States she has brusing at times. Denies any missed or extra doses.    Review of Systems  All other systems reviewed and are negative.      Objective:   Physical Exam  Constitutional: She is oriented to person, place, and time. She appears well-developed and well-nourished. No distress.  HENT:  Head: Normocephalic.  Eyes: Pupils are equal, round, and reactive to light.  Neck: Normal range of motion. Neck supple. No thyromegaly present.  Cardiovascular: Normal rate, regular rhythm, normal heart sounds and intact distal pulses.  No murmur heard. Pulmonary/Chest: Effort normal and breath sounds normal. No respiratory distress. She has no wheezes.  Abdominal: Soft. Bowel sounds are normal. She exhibits no distension. There is no tenderness.  Musculoskeletal: Normal range of motion. She exhibits edema (right ankle). She exhibits no tenderness.  Neurological: She is alert and oriented to person, place, and time. She has normal reflexes. No cranial nerve deficit.  Skin: Skin is warm and dry.  Psychiatric: She has a normal mood and affect. Her behavior is normal. Judgment and thought content normal.  Vitals reviewed.     BP 120/79   Pulse 81   Temp (!) 97.3 F (36.3 C) (Oral)   Ht 5\' 9"  (1.753 m)   Wt 204 lb 6.4 oz (92.7 kg)   BMI 30.18 kg/m      Assessment & Plan:  Christina [name] comes in today with chief complaint of protime recheck   Diagnosis and orders addressed:  1. History of DVT of lower extremity  2. On anticoagulant therapy Description   INR today 2.1 today, Goal 2-3  Continue current dose of 5 mg (1/2 tablet) on all days except on Mondays take 10 mg  ( 1 tablet).     Keep follow up with PCP    Jannifer Rodney, FNP

## 2018-06-04 NOTE — Patient Instructions (Signed)

## 2018-06-04 NOTE — Addendum Note (Signed)
Addended by: Margorie John on: 06/04/2018 04:05 PM   Modules accepted: Orders

## 2018-06-12 ENCOUNTER — Telehealth: Payer: Self-pay | Admitting: Nurse Practitioner

## 2018-06-12 DIAGNOSIS — M1712 Unilateral primary osteoarthritis, left knee: Secondary | ICD-10-CM | POA: Insufficient documentation

## 2018-06-12 DIAGNOSIS — M1711 Unilateral primary osteoarthritis, right knee: Secondary | ICD-10-CM | POA: Insufficient documentation

## 2018-06-12 NOTE — Telephone Encounter (Signed)
lmtcb

## 2018-06-25 ENCOUNTER — Telehealth: Payer: Self-pay | Admitting: Nurse Practitioner

## 2018-06-25 NOTE — Telephone Encounter (Signed)
Please advise  On medication's effectiveness.

## 2018-06-25 NOTE — Telephone Encounter (Signed)
Left details on patient's voice mail.  You are taking a fluid pill, please take the benicar. Call for questions or concerns.

## 2018-06-25 NOTE — Telephone Encounter (Signed)
Closing encounter - no response back from pt

## 2018-06-25 NOTE — Telephone Encounter (Signed)
She is on lasix so benicar is not suppose to have fluid pill in it.

## 2018-06-26 NOTE — Telephone Encounter (Signed)
Aware. 

## 2018-08-06 ENCOUNTER — Ambulatory Visit: Payer: BC Managed Care – PPO | Admitting: Nurse Practitioner

## 2018-08-06 ENCOUNTER — Encounter: Payer: Self-pay | Admitting: Nurse Practitioner

## 2018-08-06 VITALS — BP 125/78 | HR 75 | Temp 97.0°F | Ht 69.0 in | Wt 205.0 lb

## 2018-08-06 DIAGNOSIS — R609 Edema, unspecified: Secondary | ICD-10-CM

## 2018-08-06 DIAGNOSIS — E785 Hyperlipidemia, unspecified: Secondary | ICD-10-CM | POA: Diagnosis not present

## 2018-08-06 DIAGNOSIS — I10 Essential (primary) hypertension: Secondary | ICD-10-CM

## 2018-08-06 DIAGNOSIS — Z86718 Personal history of other venous thrombosis and embolism: Secondary | ICD-10-CM

## 2018-08-06 DIAGNOSIS — Z6835 Body mass index (BMI) 35.0-35.9, adult: Secondary | ICD-10-CM | POA: Diagnosis not present

## 2018-08-06 DIAGNOSIS — M10072 Idiopathic gout, left ankle and foot: Secondary | ICD-10-CM | POA: Diagnosis not present

## 2018-08-06 DIAGNOSIS — Z7901 Long term (current) use of anticoagulants: Secondary | ICD-10-CM

## 2018-08-06 DIAGNOSIS — K219 Gastro-esophageal reflux disease without esophagitis: Secondary | ICD-10-CM

## 2018-08-06 LAB — COAGUCHEK XS/INR WAIVED
INR: 1.6 — ABNORMAL HIGH (ref 0.9–1.1)
PROTHROMBIN TIME: 19.3 s

## 2018-08-06 MED ORDER — OMEPRAZOLE 40 MG PO CPDR: 40.0000 mg | DELAYED_RELEASE_CAPSULE | Freq: Every day | ORAL | 1 refills | Status: DC

## 2018-08-06 MED ORDER — OLMESARTAN MEDOXOMIL 40 MG PO TABS: 40.0000 mg | ORAL_TABLET | Freq: Every day | ORAL | 1 refills | Status: DC

## 2018-08-06 MED ORDER — WARFARIN SODIUM 10 MG PO TABS: 10.0000 mg | ORAL_TABLET | Freq: Every day | ORAL | 1 refills | Status: DC

## 2018-08-06 MED ORDER — AMLODIPINE BESYLATE 5 MG PO TABS: 5.0000 mg | ORAL_TABLET | Freq: Every day | ORAL | 1 refills | Status: DC

## 2018-08-06 MED ORDER — FUROSEMIDE 20 MG PO TABS: 20.0000 mg | ORAL_TABLET | Freq: Every day | ORAL | 1 refills | Status: DC

## 2018-08-06 NOTE — Progress Notes (Signed)
Subjective:    Patient ID: Christina [name], female    DOB: [birth year], 67 y.o.   MRN: [id number]   Chief Complaint: medical management of chronic issues  HPI:  1. Essential hypertension, benign  No c/o chest pain, sob or headache. Does not check blood pressure at home. BP Readings from Last 3 Encounters:  06/04/18 120/79  04/13/18 125/78  04/09/18 117/74     2. Acute idiopathic gout involving toe of left foot  No recent flare ups.  3. Hyperlipidemia with target LDL less than 100  Does not watch diet and does no exercise  4. BMI 35.0-35.9,adult  No recent weight hcanges     Indication: DVT Bleeding signs/symptoms: None Thromboembolic signs/symptoms: None  Missed Coumadin doses: yes 2 doses Medication changes: no Dietary changes: no Bacterial/viral infection: no Other concerns: no     Outpatient Encounter Medications as of 08/06/2018  Medication Sig  . amLODipine (NORVASC) 5 MG tablet TAKE 1 TABLET BY MOUTH EVERY DAY  . Cholecalciferol (VITAMIN D-3) 5000 UNITS TABS Take 1 capsule by mouth daily.  . colchicine 0.6 MG tablet 2 po at pain onset. May repeat 1 tablet in 1 hour if still hurting.  . furosemide (LASIX) 20 MG tablet Take 1 tablet (20 mg total) by mouth daily.  Marland Kitchen olmesartan (BENICAR) 40 MG tablet Take 1 tablet (40 mg total) by mouth daily.  Marland Kitchen omeprazole (PRILOSEC) 40 MG capsule Take 1 capsule (40 mg total) by mouth daily.  Marland Kitchen warfarin (COUMADIN) 10 MG tablet Take 1 tablet (10 mg total) by mouth daily.       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  HENT: Negative.   Eyes: Negative for pain.  Respiratory: Negative for shortness of breath.   Cardiovascular: Negative for chest pain, palpitations and leg swelling.  Gastrointestinal: Negative for abdominal pain.  Endocrine: Negative for  polydipsia.  Genitourinary: Negative.   Skin: Negative for rash.  Neurological: Negative for dizziness, weakness and headaches.  Hematological: Does not bruise/bleed easily.  Psychiatric/Behavioral: Negative.   All other systems reviewed and are negative.      Objective:   Physical Exam Vitals signs and nursing note reviewed.  Constitutional:      General: She is not in acute distress.    Appearance: Normal appearance. She is well-developed.  HENT:     Head: Normocephalic.     Nose: Nose normal.  Eyes:     Pupils: Pupils are equal, round, and reactive to light.  Neck:     Musculoskeletal: Normal range of motion and neck supple.     Vascular: No carotid bruit or JVD.  Cardiovascular:     Rate and Rhythm: Normal rate and regular rhythm.     Heart sounds: Normal heart sounds.  Pulmonary:     Effort: Pulmonary effort is normal. No respiratory distress.     Breath sounds: Normal breath sounds. No wheezing or rales.  Chest:     Chest wall: No tenderness.  Abdominal:     General: Bowel sounds are normal. There is no distension or abdominal bruit.     Palpations: Abdomen is soft. There is no hepatomegaly, splenomegaly, mass or pulsatile mass.     Tenderness: There is no abdominal tenderness.  Musculoskeletal: Normal range of motion.  Lymphadenopathy:     Cervical: No cervical adenopathy.  Skin:  General: Skin is warm and dry.  Neurological:     Mental Status: She is alert and oriented to person, place, and time.     Deep Tendon Reflexes: Reflexes are normal and symmetric.  Psychiatric:        Behavior: Behavior normal.        Thought Content: Thought content normal.        Judgment: Judgment normal.    BP 125/78   Pulse 75   Temp (!) 97 F (36.1 C) (Oral)   Ht '5\' 9"'  (1.753 m)   Wt 205 lb (93 kg)   BMI 30.27 kg/m         Assessment & Plan:  Christina [name] comes in today with chief complaint of Medical Management of Chronic Issues   Diagnosis and orders  addressed:  1. Essential hypertension, benign Low sodium diet - CMP14+EGFR - olmesartan (BENICAR) 40 MG tablet; Take 1 tablet (40 mg total) by mouth daily.  Dispense: 90 tablet; Refill: 1 - amLODipine (NORVASC) 5 MG tablet; Take 1 tablet (5 mg total) by mouth daily.  Dispense: 90 tablet; Refill: 1  2. Acute idiopathic gout involving toe of left foot Low purine diet  3. Hyperlipidemia with target LDL less than 100 Low fat diet - Lipid panel  4. BMI 35.0-35.9,adult Discussed diet and exercise for person with BMI >25 Will recheck weight in 3-6 months  5. On anticoagulant therapy See anticoag documentation - CoaguChek XS/INR Waived - warfarin (COUMADIN) 10 MG tablet; Take 1 tablet (10 mg total) by mouth daily.  Dispense: 90 tablet; Refill: 1  6. History of DVT of lower extremity  7. Gastroesophageal reflux disease without esophagitis Avoid spicy foods Do not eat 2 hours prior to bedtime - omeprazole (PRILOSEC) 40 MG capsule; Take 1 capsule (40 mg total) by mouth daily.  Dispense: 90 capsule; Refill: 1  8. Peripheral edema Elevate legs when siting - furosemide (LASIX) 20 MG tablet; Take 1 tablet (20 mg total) by mouth daily.  Dispense: 90 tablet; Refill: 1   Labs pending Health Maintenance reviewed Diet and exercise encouraged  Follow up plan: 6 months   Mary-Margaret Hassell Done, FNP

## 2018-08-07 LAB — CMP14+EGFR
ALT: 65 IU/L — AB (ref 0–32)
AST: 85 IU/L — AB (ref 0–40)
Albumin/Globulin Ratio: 1.8 (ref 1.2–2.2)
Albumin: 3.5 g/dL — ABNORMAL LOW (ref 3.6–4.8)
Alkaline Phosphatase: 118 IU/L — ABNORMAL HIGH (ref 39–117)
BUN/Creatinine Ratio: 12 (ref 12–28)
BUN: 14 mg/dL (ref 8–27)
Bilirubin Total: 0.3 mg/dL (ref 0.0–1.2)
CALCIUM: 9.5 mg/dL (ref 8.7–10.3)
CO2: 22 mmol/L (ref 20–29)
CREATININE: 1.14 mg/dL — AB (ref 0.57–1.00)
Chloride: 107 mmol/L — ABNORMAL HIGH (ref 96–106)
GFR calc Af Amer: 57 mL/min/{1.73_m2} — ABNORMAL LOW (ref >59)
GFR calc non Af Amer: 49 mL/min/{1.73_m2} — ABNORMAL LOW (ref >59)
GLUCOSE: 86 mg/dL (ref 65–99)
Globulin, Total: 2 g/dL (ref 1.5–4.5)
Potassium: 4.3 mmol/L (ref 3.5–5.2)
SODIUM: 145 mmol/L — AB (ref 134–144)
Total Protein: 5.5 g/dL — ABNORMAL LOW (ref 6.0–8.5)

## 2018-08-07 LAB — LIPID PANEL
Chol/HDL Ratio: 3.1 ratio (ref 0.0–4.4)
Cholesterol, Total: 194 mg/dL (ref 100–199)
HDL: 63 mg/dL (ref >39)
LDL CALC: 110 mg/dL — AB (ref 0–99)
TRIGLYCERIDES: 103 mg/dL (ref 0–149)
VLDL CHOLESTEROL CAL: 21 mg/dL (ref 5–40)

## 2018-08-18 ENCOUNTER — Other Ambulatory Visit: Payer: Self-pay | Admitting: Nurse Practitioner

## 2018-08-18 DIAGNOSIS — M10072 Idiopathic gout, left ankle and foot: Secondary | ICD-10-CM

## 2018-08-20 ENCOUNTER — Other Ambulatory Visit: Payer: Self-pay | Admitting: Nurse Practitioner

## 2018-08-20 DIAGNOSIS — Z7901 Long term (current) use of anticoagulants: Secondary | ICD-10-CM

## 2018-08-28 ENCOUNTER — Ambulatory Visit (INDEPENDENT_AMBULATORY_CARE_PROVIDER_SITE_OTHER): Payer: BC Managed Care – PPO | Admitting: Internal Medicine

## 2018-08-28 ENCOUNTER — Encounter (INDEPENDENT_AMBULATORY_CARE_PROVIDER_SITE_OTHER): Payer: Self-pay | Admitting: Internal Medicine

## 2018-08-28 VITALS — BP 148/88 | HR 84 | Temp 97.7°F | Resp 18 | Ht 69.0 in | Wt 204.1 lb

## 2018-08-28 DIAGNOSIS — K760 Fatty (change of) liver, not elsewhere classified: Secondary | ICD-10-CM

## 2018-08-28 DIAGNOSIS — R748 Abnormal levels of other serum enzymes: Secondary | ICD-10-CM

## 2018-08-28 NOTE — Patient Instructions (Signed)
Next blood work to be done in second week of March 2020. Office will remind you.

## 2018-08-28 NOTE — Progress Notes (Signed)
Presenting complaint;  Follow-up for elevated transaminases.  Database and subjective:  Patient is 68 year old African-American female who was found to have elevated transaminases back in April 2018.  Her transaminase was normal prior to that in October 2017.  Acute hepatitis panel was negative and ultrasound in May 2018 revealed no abnormality to gallbladder liver and bile duct but suggested a mass in involving right kidney.  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  Follow-up was recommended.  She had a follow-up MR in September 2019 revealing cyst to be stable and once again no abnormality was noted to hepatobiliary system. Following her initial visit to our office in July 2018 she had normal ceruloplasmin and alpha-1 antitrypsin levels.  Ferritin was also normal.  ANA was positive at a titer of 08/16/1940.  IgG was normal and sed rate was 4. It was felt that she possibly had fatty liver and an observation was advised. Patient had chemistry panel repeated on 08/06/2018 and transaminases are coming down.  She has no complaints.  She has good appetite.  She is watching what she eats.  She has lost 23 pounds since her first visit to our office of February 20, 2017.  She has lost 7 pounds in the last 6 months.  She denies abdominal pain pruritus fatigue weakness.  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 She does not drink alcohol.  Family history is negative for liver disease.   Current Medications: Outpatient Encounter Medications as of 08/28/2018  Medication Sig  . amLODipine (NORVASC) 5 MG tablet Take 1 tablet (5 mg total) by mouth daily.  . Cholecalciferol (VITAMIN D-3) 5000 UNITS TABS Take 1 capsule by mouth daily.  . colchicine 0.6 MG tablet TAKE 2 AT PAIN ONSET. MAY  REPEAT 1 TABLET IN 1 HOUR IF STILL HURTING.  . furosemide (LASIX) 20 MG tablet Take 1 tablet (20 mg total) by mouth daily.  Marland Kitchen olmesartan (BENICAR) 40 MG tablet Take 1 tablet (40 mg total) by mouth daily.  Marland Kitchen omeprazole (PRILOSEC) 40 MG capsule Take 1 capsule (40 mg total) by mouth daily.  Marland Kitchen warfarin (COUMADIN) 10 MG tablet TAKE 1 TABLET BY MOUTH EVERY DAY (Patient taking differently: Patient is taking 10 mg on Mondays and 5 mg the rest of the week.)   No facility-administered encounter medications on file as of 08/28/2018.      Objective: Blood pressure (!) 148/88, pulse 84, temperature 97.7 F (36.5 C), temperature source Oral, resp. rate 18, height _0  (1.753 m), weight 204 lb 1.6 oz (92.6 kg). Patient is alert and in no acute distress. Conjunctiva is pink. Sclera is nonicteric Oropharyngeal mucosa is normal. No neck masses or thyromegaly noted. Cardiac exam with regular rhythm normal S1 and S2. No murmur or gallop noted. Lungs are clear to auscultation. Abdomen is symmetrical.  On palpation is soft and nontender without hepatosplenomegaly or masses. No LE edema or clubbing noted.  Labs/studies Results:  CBC Latest Ref Rng & Units 09/10/2014  WBC 3.4 - 10.8 x10E3/uL 9.2  Hemoglobin 11.1 - 15.9 g/dL 12.7  Hematocrit 34.0 - 46.6 % 37.0  Platelets 150 - 379 x10E3/uL 316    CMP Latest Ref Rng & Units 08/06/2018 04/19/2018 04/13/2018  Glucose 65 - 99 mg/dL 86 -  94  BUN 8 - 27 mg/dL 14 - 16  Creatinine 0.57 - 1.00 mg/dL 1.14(H) - 1.11(H)  Sodium 134 - 144 mmol/L 145(H) - 144  Potassium 3.5 - 5.2 mmol/L 4.3 - 4.4  Chloride 96 - 106 mmol/L 107(H) - 105  CO2 20 - 29 mmol/L 22 - 25  Calcium 8.7 - 10.3 mg/dL 9.5 - 10.0  Total Protein 6.0 - 8.5 g/dL 5.5(L) 6.4 6.3  Total Bilirubin 0.0 - 1.2 mg/dL 0.3 0.6 0.4  Alkaline Phos 39 - 117 IU/L 118(H) - 125(H)  AST 0 - 40 IU/L 85(H) 148(H) 127(H)  ALT 0 - 32 IU/L 65(H) 101(H) 105(H)    Hepatic Function Latest Ref Rng & Units 08/06/2018  04/19/2018 04/13/2018  Total Protein 6.0 - 8.5 g/dL 5.5(L) 6.4 6.3  Albumin 3.6 - 4.8 g/dL 3.5(L) - 4.1  AST 0 - 40 IU/L 85(H) 148(H) 127(H)  ALT 0 - 32 IU/L 65(H) 101(H) 105(H)  Alk Phosphatase 39 - 117 IU/L 118(H) - 125(H)  Total Bilirubin 0.0 - 1.2 mg/dL 0.3 0.6 0.4  Bilirubin, Direct 0.0 - 0.2 mg/dL - 0.1 -     Assessment:  #1.  History of elevated transaminases since April 2018.  Transaminases were normal prior to that.  MR imaging was suggestive of fatty liver.  All her biochemical markers have been negative.  Serum IgG in July 2018 was normal.  ANA was positive in July 2018 at a low titer 1:40 and subsequently titer increased to 1:160 in July last year.  Positive ANA appears to be nonspecific abnormality and not indicative of autoimmune hepatitis.  Significant improvement noted in transaminases over the last 6 months incompatible to weight loss would  favor diagnosis of fatty liver.  Since transaminases have significantly improve I do not feel we need to proceed with liver biopsy.  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   Plan:  Patient encouraged to exercise at least 3 times a week for minimum of 30 minutes.  Since she cannot walk because of knee problems she may consider using her upper extremities. Will check LFTs ANA with titer in 2 months. We will plan to see her back in 1 year unless transaminases go back up again.

## 2018-08-29 ENCOUNTER — Other Ambulatory Visit (INDEPENDENT_AMBULATORY_CARE_PROVIDER_SITE_OTHER): Payer: Self-pay | Admitting: *Deleted

## 2018-08-31 ENCOUNTER — Other Ambulatory Visit (INDEPENDENT_AMBULATORY_CARE_PROVIDER_SITE_OTHER): Payer: Self-pay | Admitting: *Deleted

## 2018-08-31 DIAGNOSIS — R748 Abnormal levels of other serum enzymes: Secondary | ICD-10-CM

## 2018-09-06 ENCOUNTER — Encounter: Payer: Self-pay | Admitting: Nurse Practitioner

## 2018-09-06 ENCOUNTER — Ambulatory Visit: Payer: BC Managed Care – PPO | Admitting: Nurse Practitioner

## 2018-09-06 VITALS — BP 105/61 | HR 92 | Temp 97.0°F | Ht 69.0 in | Wt 205.0 lb

## 2018-09-06 DIAGNOSIS — Z86718 Personal history of other venous thrombosis and embolism: Secondary | ICD-10-CM

## 2018-09-06 DIAGNOSIS — Z7901 Long term (current) use of anticoagulants: Secondary | ICD-10-CM

## 2018-09-06 LAB — COAGUCHEK XS/INR WAIVED
INR: 1.4 — AB (ref 0.9–1.1)
Prothrombin Time: 17.1 s

## 2018-09-06 NOTE — Progress Notes (Signed)
Subjective:    Chief complaint:  inr recheck   Indication: DVT Bleeding signs/symptoms: None Thromboembolic signs/symptoms: None  Missed Coumadin doses: None Medication changes: no Dietary changes: no Bacterial/viral infection: no Other concerns: no  The following portions of the patient's history were reviewed and updated as appropriate: allergies, current medications, past family history, past medical history, past social history, past surgical history and problem list.  Review of Systems Pertinent items noted in HPI and remainder of comprehensive ROS otherwise negative.   Objective:    INR Today: 1.4 Current dose: coumadin 5mg  daily except 10mg  on monday    Assessment:    Therapeutic INR for goal of 2-3   Plan:    1. New dose: coumadin 5mg  daily except 10mg  on m and f   2. Next INR: 2 weeks    Mary-Margaret Daphine Deutscher, FNP

## 2018-09-20 ENCOUNTER — Ambulatory Visit: Payer: BC Managed Care – PPO | Admitting: Nurse Practitioner

## 2018-09-20 ENCOUNTER — Encounter: Payer: Self-pay | Admitting: Nurse Practitioner

## 2018-09-20 DIAGNOSIS — Z7901 Long term (current) use of anticoagulants: Secondary | ICD-10-CM

## 2018-09-20 DIAGNOSIS — Z86718 Personal history of other venous thrombosis and embolism: Secondary | ICD-10-CM | POA: Diagnosis not present

## 2018-09-20 LAB — COAGUCHEK XS/INR WAIVED
INR: 2.5 — AB (ref 0.9–1.1)
Prothrombin Time: 30 s

## 2018-09-20 NOTE — Progress Notes (Signed)
Subjective:   Chief Complaint:  INR recheck   Indication: DVT Bleeding signs/symptoms: None Thromboembolic signs/symptoms: None  Missed Coumadin doses: None Medication changes: yes - increased from 5mg  to 10mg  last 2 fridays due to low INR Dietary changes: no Bacterial/viral infection: no Other concerns: no  The following portions of the patient's history were reviewed and updated as appropriate: allergies, current medications, past family history, past medical history, past social history, past surgical history and problem list.  Review of Systems Pertinent items noted in HPI and remainder of comprehensive ROS otherwise negative.   Objective:    INR Today: 2.5 Current dose: coumadin 5mg  daily except 10mg  on Monday and friday    Assessment:    Therapeutic INR for goal of 2-3   Plan:    1. New dose: no change   2. Next INR: 1 month    Mary-Margaret Daphine Deutscher, FNP

## 2018-11-05 ENCOUNTER — Other Ambulatory Visit: Payer: Self-pay | Admitting: Nurse Practitioner

## 2018-11-05 DIAGNOSIS — M10072 Idiopathic gout, left ankle and foot: Secondary | ICD-10-CM

## 2018-11-09 ENCOUNTER — Telehealth: Payer: Self-pay | Admitting: Nurse Practitioner

## 2018-11-09 NOTE — Telephone Encounter (Signed)
Pt is aware MMM and her nurse out till Monday and we will forward this to her.

## 2018-11-09 NOTE — Telephone Encounter (Signed)
Pt is wanting to talk to MMM nurse about the DMV CDL ppw she received from the state, pt states that she has to do this yearly and it is something to do with her medical stuff, not sure if she needs to be seen or can drop off the ppw

## 2018-11-12 NOTE — Telephone Encounter (Signed)
Has to bee seen in person for that according to [facility]- I will be in office Tuesday and Friday this week.

## 2018-11-12 NOTE — Telephone Encounter (Signed)
Appt made

## 2018-11-16 ENCOUNTER — Other Ambulatory Visit: Payer: Self-pay

## 2018-11-16 ENCOUNTER — Ambulatory Visit: Payer: BC Managed Care – PPO | Admitting: Nurse Practitioner

## 2018-11-19 ENCOUNTER — Encounter: Payer: Self-pay | Admitting: Nurse Practitioner

## 2018-11-19 ENCOUNTER — Ambulatory Visit (INDEPENDENT_AMBULATORY_CARE_PROVIDER_SITE_OTHER): Payer: BC Managed Care – PPO | Admitting: Nurse Practitioner

## 2018-11-19 ENCOUNTER — Other Ambulatory Visit: Payer: Self-pay

## 2018-11-19 VITALS — Ht 69.0 in | Wt 201.0 lb

## 2018-11-19 DIAGNOSIS — Z024 Encounter for examination for driving license: Secondary | ICD-10-CM

## 2018-11-19 LAB — URINALYSIS
Bilirubin, UA: NEGATIVE
Glucose, UA: NEGATIVE
Ketones, UA: NEGATIVE
Leukocytes,UA: NEGATIVE
Nitrite, UA: NEGATIVE
Protein,UA: NEGATIVE
RBC, UA: NEGATIVE
Specific Gravity, UA: 1.02 (ref 1.005–1.030)
Urobilinogen, Ur: 0.2 mg/dL (ref 0.2–1.0)
pH, UA: 6 (ref 5.0–7.5)

## 2018-11-19 NOTE — Progress Notes (Signed)
Patient ID: Christina [name], female   DOB: 1951-02-03, 68 y.o.   MRN: [id number]  [place] DOT- see scanned in document

## 2018-12-05 ENCOUNTER — Telehealth: Payer: Self-pay | Admitting: Nurse Practitioner

## 2018-12-10 ENCOUNTER — Telehealth: Payer: Self-pay | Admitting: Nurse Practitioner

## 2018-12-10 ENCOUNTER — Other Ambulatory Visit: Payer: Self-pay

## 2018-12-10 NOTE — Telephone Encounter (Signed)
DOT Done on 11/19/18. There is a date missing - maybe a expiration date or something....   All date said 11/19/18.  Here is fax # for [place] to send it in correctly.  [phone]

## 2018-12-10 NOTE — Telephone Encounter (Signed)
Spoke with patient and she states that she had packaged tracked and it did arrive at the desired location. DMV advised her to give them a few more days to get everything in the system and call them back. Advised patient if they still do not have then get a fax number and I would resend.

## 2018-12-10 NOTE — Telephone Encounter (Signed)
Called pt - she has been dealing with allergies for several weeks, itching and slight cough at times - due to pollen - aware to bring and wear a mask while here for [place] tomorrow.

## 2018-12-10 NOTE — Telephone Encounter (Signed)
Checked our copy and all dates look correct. Faxed to number provided

## 2018-12-11 ENCOUNTER — Ambulatory Visit: Payer: BC Managed Care – PPO | Admitting: Nurse Practitioner

## 2018-12-11 ENCOUNTER — Other Ambulatory Visit: Payer: Self-pay

## 2018-12-11 ENCOUNTER — Encounter: Payer: Self-pay | Admitting: Nurse Practitioner

## 2018-12-11 VITALS — BP 125/83 | HR 86 | Temp 98.2°F | Ht 69.0 in | Wt 203.0 lb

## 2018-12-11 DIAGNOSIS — Z86718 Personal history of other venous thrombosis and embolism: Secondary | ICD-10-CM | POA: Diagnosis not present

## 2018-12-11 DIAGNOSIS — Z7901 Long term (current) use of anticoagulants: Secondary | ICD-10-CM | POA: Diagnosis not present

## 2018-12-11 LAB — COAGUCHEK XS/INR WAIVED
INR: 3 — ABNORMAL HIGH (ref 0.9–1.1)
Prothrombin Time: 35.6 s

## 2018-12-11 NOTE — Progress Notes (Signed)
Subjective:   Chief Complaint: INR recheck   Indication: DVT Bleeding signs/symptoms: None Thromboembolic signs/symptoms: None  Missed Coumadin doses: None Medication changes: no Dietary changes: no Bacterial/viral infection: no Other concerns: no  The following portions of the patient's history were reviewed and updated as appropriate: allergies, current medications, past family history, past medical history, past social history, past surgical history and problem list.  Review of Systems Pertinent items noted in HPI and remainder of comprehensive ROS otherwise negative.   Objective:    INR Today: 3.0 Current dose: coumadin 5mg  daily except 10mg  on MOn and friday    Assessment:    Therapeutic INR for goal of 2-3   Plan:    1. New dose: no change   2. Next INR: 1 month    Mary-Margaret Daphine Deutscher, FNP

## 2018-12-18 ENCOUNTER — Telehealth: Payer: Self-pay | Admitting: Nurse Practitioner

## 2018-12-18 NOTE — Telephone Encounter (Signed)
Fyi.

## 2018-12-27 ENCOUNTER — Telehealth: Payer: Self-pay | Admitting: Nurse Practitioner

## 2019-01-01 NOTE — Telephone Encounter (Signed)
No call back - encounter will be closed.  

## 2019-01-11 ENCOUNTER — Other Ambulatory Visit: Payer: Self-pay | Admitting: Nurse Practitioner

## 2019-01-11 DIAGNOSIS — I10 Essential (primary) hypertension: Secondary | ICD-10-CM

## 2019-01-12 ENCOUNTER — Other Ambulatory Visit: Payer: Self-pay | Admitting: Nurse Practitioner

## 2019-01-12 DIAGNOSIS — I10 Essential (primary) hypertension: Secondary | ICD-10-CM

## 2019-01-17 ENCOUNTER — Other Ambulatory Visit: Payer: Self-pay

## 2019-01-18 ENCOUNTER — Ambulatory Visit: Payer: BC Managed Care – PPO | Admitting: Nurse Practitioner

## 2019-01-21 ENCOUNTER — Ambulatory Visit: Payer: BC Managed Care – PPO | Admitting: Nurse Practitioner

## 2019-01-21 ENCOUNTER — Encounter: Payer: Self-pay | Admitting: Nurse Practitioner

## 2019-01-21 ENCOUNTER — Other Ambulatory Visit: Payer: Self-pay

## 2019-01-21 VITALS — BP 126/81 | HR 88 | Temp 97.9°F | Ht 69.0 in | Wt 205.0 lb

## 2019-01-21 DIAGNOSIS — Z7901 Long term (current) use of anticoagulants: Secondary | ICD-10-CM | POA: Diagnosis not present

## 2019-01-21 DIAGNOSIS — Z86718 Personal history of other venous thrombosis and embolism: Secondary | ICD-10-CM | POA: Diagnosis not present

## 2019-01-21 LAB — COAGUCHEK XS/INR WAIVED
INR: 4.5 — ABNORMAL HIGH (ref 0.9–1.1)
Prothrombin Time: 54.6 s

## 2019-01-21 NOTE — Progress Notes (Signed)
Subjective:   Chief Complaint: INFR recheck   Indication: DVT Bleeding signs/symptoms: None Thromboembolic signs/symptoms: None  Missed Coumadin doses: None Medication changes: no Dietary changes: no Bacterial/viral infection: no Other concerns: no  The following portions of the patient's history were reviewed and updated as appropriate: allergies, current medications, past family history, past medical history, past social history, past surgical history and problem list.  Review of Systems Pertinent items noted in HPI and remainder of comprehensive ROS otherwise negative.   Objective:    INR Today: 4.5 Current dose: coumadin 5mg  daily except 10mg  on Mon and Fri    Assessment:    Supratherapeutic INR for goal of 2-3   Plan:    1. New dose: hold tues and wed then coumadin 5mg  daily except 10mg  on monday   2. Next INR: 2 weeks    Mary-Margaret Hassell Done, FNP

## 2019-01-31 ENCOUNTER — Other Ambulatory Visit: Payer: Self-pay

## 2019-02-01 ENCOUNTER — Other Ambulatory Visit: Payer: Self-pay

## 2019-02-04 ENCOUNTER — Ambulatory Visit: Payer: BC Managed Care – PPO | Admitting: Nurse Practitioner

## 2019-02-04 ENCOUNTER — Encounter: Payer: Self-pay | Admitting: Nurse Practitioner

## 2019-02-04 ENCOUNTER — Other Ambulatory Visit: Payer: Self-pay

## 2019-02-04 VITALS — BP 122/75 | HR 82 | Temp 98.3°F | Ht 69.0 in | Wt 205.0 lb

## 2019-02-04 DIAGNOSIS — Z86718 Personal history of other venous thrombosis and embolism: Secondary | ICD-10-CM

## 2019-02-04 DIAGNOSIS — Z7901 Long term (current) use of anticoagulants: Secondary | ICD-10-CM | POA: Diagnosis not present

## 2019-02-04 LAB — COAGUCHEK XS/INR WAIVED
INR: 2.6 — ABNORMAL HIGH (ref 0.9–1.1)
Prothrombin Time: 31.3 s

## 2019-02-04 NOTE — Progress Notes (Signed)
Subjective:   Chief Complaint: INR recheck   Indication: DVT Bleeding signs/symptoms: None Thromboembolic signs/symptoms: None  Missed Coumadin doses: None Medication changes: no Dietary changes: no Bacterial/viral infection: no Other concerns: no  The following portions of the patient's history were reviewed and updated as appropriate: allergies, current medications, past family history, past medical history, past social history, past surgical history and problem list.  Review of Systems Pertinent items noted in HPI and remainder of comprehensive ROS otherwise negative.   Objective:    INR Today: 2.6 Current dose: coumadin 5mg  daily except 10mg  on monday    Assessment:    Therapeutic INR for goal of 2-3   Plan:    1. New dose: no change   2. Next INR: 1 month    Mary-Margaret Hassell Done, FNP

## 2019-02-28 ENCOUNTER — Other Ambulatory Visit: Payer: Self-pay

## 2019-03-04 ENCOUNTER — Other Ambulatory Visit: Payer: Self-pay

## 2019-03-04 ENCOUNTER — Encounter: Payer: Self-pay | Admitting: Nurse Practitioner

## 2019-03-04 ENCOUNTER — Ambulatory Visit: Payer: BC Managed Care – PPO | Admitting: Nurse Practitioner

## 2019-03-04 DIAGNOSIS — Z86718 Personal history of other venous thrombosis and embolism: Secondary | ICD-10-CM | POA: Diagnosis not present

## 2019-03-04 DIAGNOSIS — Z7901 Long term (current) use of anticoagulants: Secondary | ICD-10-CM | POA: Diagnosis not present

## 2019-03-04 LAB — COAGUCHEK XS/INR WAIVED
INR: 2 — ABNORMAL HIGH (ref 0.9–1.1)
Prothrombin Time: 24.6 s

## 2019-03-04 NOTE — Progress Notes (Signed)
Subjective:     Virtual Visit via telephone Note  I connected with Christina [name] on 03/04/19 at 3:20 by telephone and verified that I am speaking with the correct person using two identifiers. Christina [name] is currently located at home and no one is currently with her during visit. The provider, Mary-Margaret Hassell Done, FNP is located in their office at time of visit.  I discussed the limitations, risks, security and privacy concerns of performing an evaluation and management service by telephone and the availability of in person appointments. I also discussed with the patient that there may be a patient responsible charge related to this service. The patient expressed understanding and agreed to proceed.   History and Present Illness:  ndication: DVT Bleeding signs/symptoms: None Thromboembolic signs/symptoms: None  Missed Coumadin doses: None Medication changes: no Dietary changes: no Bacterial/viral infection: no Other concerns: no  The following portions of the patient's history were reviewed and updated as appropriate: allergies, current medications, past family history, past medical history, past social history, past surgical history and problem list.  Review of Systems Pertinent items noted in HPI and remainder of comprehensive ROS otherwise negative.   Objective:    INR Today: 2.0 Current dose: coumadin 5mg  daily except 10mg  on mondays   Assessment:    Therapeutic INR for goal of 2-3   Plan:    1. New dose: coumadin 5mg  daily except 10mg  on Monday and friday   2. Next INR: 1 month     I discussed the assessment and treatment plan with the patient. The patient was provided an opportunity to ask questions and all were answered. The patient agreed with the plan and demonstrated an understanding of the instructions.   The patient was advised to call back or seek an in-person evaluation if the symptoms worsen or if the condition fails to improve as anticipated.  The  above assessment and management plan was discussed with the patient. The patient verbalized understanding of and has agreed to the management plan. Patient is aware to call the clinic if symptoms persist or worsen. Patient is aware when to return to the clinic for a follow-up visit. Patient educated on when it is appropriate to go to the emergency department.   Time call ended:  3:30  I provided 10 minutes of non-face-to-face time during this encounter.    [place], FNP    I

## 2019-03-06 ENCOUNTER — Other Ambulatory Visit: Payer: Self-pay | Admitting: Nurse Practitioner

## 2019-03-06 DIAGNOSIS — Z7901 Long term (current) use of anticoagulants: Secondary | ICD-10-CM

## 2019-04-01 ENCOUNTER — Ambulatory Visit: Payer: BC Managed Care – PPO | Admitting: Nurse Practitioner

## 2019-04-01 ENCOUNTER — Other Ambulatory Visit: Payer: Self-pay

## 2019-04-01 ENCOUNTER — Encounter: Payer: Self-pay | Admitting: Nurse Practitioner

## 2019-04-01 DIAGNOSIS — Z86718 Personal history of other venous thrombosis and embolism: Secondary | ICD-10-CM

## 2019-04-01 DIAGNOSIS — Z7901 Long term (current) use of anticoagulants: Secondary | ICD-10-CM

## 2019-04-01 LAB — COAGUCHEK XS/INR WAIVED
INR: 4.2 — ABNORMAL HIGH (ref 0.9–1.1)
Prothrombin Time: 50.9 s

## 2019-04-01 NOTE — Addendum Note (Signed)
Addended by: Rolena Infante on: 04/01/2019 03:44 PM   Modules accepted: Orders

## 2019-04-12 ENCOUNTER — Other Ambulatory Visit: Payer: Self-pay

## 2019-04-15 ENCOUNTER — Other Ambulatory Visit: Payer: Self-pay

## 2019-04-15 ENCOUNTER — Encounter: Payer: Self-pay | Admitting: Nurse Practitioner

## 2019-04-15 ENCOUNTER — Ambulatory Visit: Payer: BC Managed Care – PPO | Admitting: Nurse Practitioner

## 2019-04-15 VITALS — BP 115/66 | HR 78 | Temp 97.3°F | Ht 69.0 in | Wt 203.0 lb

## 2019-04-15 DIAGNOSIS — Z86718 Personal history of other venous thrombosis and embolism: Secondary | ICD-10-CM | POA: Diagnosis not present

## 2019-04-15 DIAGNOSIS — Z7901 Long term (current) use of anticoagulants: Secondary | ICD-10-CM

## 2019-04-15 LAB — COAGUCHEK XS/INR WAIVED
INR: 3.2 — ABNORMAL HIGH (ref 0.9–1.1)
Prothrombin Time: 38.9 s

## 2019-04-15 NOTE — Progress Notes (Signed)
Subjective:   Chief Complaint:  INR recheck   Indication: DVT Bleeding signs/symptoms: None Thromboembolic signs/symptoms: None  Missed Coumadin doses: None Medication changes: no Dietary changes: no Bacterial/viral infection: no Other concerns: no  The following portions of the patient's history were reviewed and updated as appropriate: allergies, current medications, past family history, past medical history, past social history, past surgical history and problem list.  Review of Systems Pertinent items noted in HPI and remainder of comprehensive ROS otherwise negative.   Objective:    INR Today: 3.2 Current dose: 5mg  daily exept 10mg  on friday    Assessment:    Supratherapeutic INR for goal of 2-3   Plan:    1. New dose: no change   2. Next INR: 1 month    Mary-Margaret Hassell Done, FNP '

## 2019-04-29 ENCOUNTER — Telehealth: Payer: Self-pay | Admitting: Nurse Practitioner

## 2019-04-30 NOTE — Telephone Encounter (Signed)
Patient states that she called to orthopedic doctor to have knee replacement.  Ortho sent papers for pre op sign off to MMM.  Patient states that she wants to wait until after christmas to have surgery

## 2019-05-22 ENCOUNTER — Other Ambulatory Visit: Payer: Self-pay | Admitting: Physician Assistant

## 2019-05-22 DIAGNOSIS — I10 Essential (primary) hypertension: Secondary | ICD-10-CM

## 2019-06-03 ENCOUNTER — Encounter: Payer: Self-pay | Admitting: Family Medicine

## 2019-06-03 ENCOUNTER — Ambulatory Visit: Payer: BC Managed Care – PPO | Admitting: Family Medicine

## 2019-06-03 ENCOUNTER — Other Ambulatory Visit: Payer: Self-pay

## 2019-06-03 VITALS — BP 115/73 | HR 86 | Temp 97.8°F | Resp 20 | Ht 68.0 in | Wt 197.0 lb

## 2019-06-03 DIAGNOSIS — R82998 Other abnormal findings in urine: Secondary | ICD-10-CM | POA: Diagnosis not present

## 2019-06-03 DIAGNOSIS — M545 Low back pain, unspecified: Secondary | ICD-10-CM

## 2019-06-03 DIAGNOSIS — N3001 Acute cystitis with hematuria: Secondary | ICD-10-CM | POA: Diagnosis not present

## 2019-06-03 DIAGNOSIS — N281 Cyst of kidney, acquired: Secondary | ICD-10-CM | POA: Diagnosis not present

## 2019-06-03 DIAGNOSIS — Z7901 Long term (current) use of anticoagulants: Secondary | ICD-10-CM

## 2019-06-03 LAB — URINALYSIS, COMPLETE
Bilirubin, UA: NEGATIVE
Glucose, UA: NEGATIVE
Ketones, UA: NEGATIVE
Nitrite, UA: NEGATIVE
Specific Gravity, UA: >1.03 — ABNORMAL HIGH (ref 1.005–1.030)
Urobilinogen, Ur: 1 mg/dL (ref 0.2–1.0)
pH, UA: 5.5 (ref 5.0–7.5)

## 2019-06-03 LAB — MICROSCOPIC EXAMINATION

## 2019-06-03 MED ORDER — FOSFOMYCIN TROMETHAMINE 3 G PO PACK: 3.0000 g | PACK | Freq: Once | ORAL | 0 refills | Status: AC

## 2019-06-03 MED ORDER — METHYLPREDNISOLONE ACETATE 80 MG/ML IJ SUSP
40.0000 mg | Freq: Once | INTRAMUSCULAR | Status: AC
  Administered 2019-06-03: 40 mg via INTRAMUSCULAR

## 2019-06-03 MED ORDER — DICLOFENAC SODIUM 1 % TD GEL: 2.0000 g | Freq: Four times a day (QID) | TRANSDERMAL | 0 refills | Status: DC

## 2019-06-03 NOTE — Patient Instructions (Signed)
Acute Back Pain, Adult Acute back pain is sudden and usually short-lived. It is often caused by an injury to the muscles and tissues in the back. The injury may result from:  A muscle or ligament getting overstretched or torn (strained). Ligaments are tissues that connect bones to each other. Lifting something improperly can cause a back strain.  Wear and tear (degeneration) of the spinal disks. Spinal disks are circular tissue that provides cushioning between the bones of the spine (vertebrae).  Twisting motions, such as while playing sports or doing yard work.  A hit to the back.  Arthritis. You may have a physical exam, lab tests, and imaging tests to find the cause of your pain. Acute back pain usually goes away with rest and home care. Follow these instructions at home: Managing pain, stiffness, and swelling  Take over-the-counter and prescription medicines only as told by your health care provider.  Your health care provider may recommend applying ice during the first 24-48 hours after your pain starts. To do this: ? Put ice in a plastic bag. ? Place a towel between your skin and the bag. ? Leave the ice on for 20 minutes, 2-3 times a day.  If directed, apply heat to the affected area as often as told by your health care provider. Use the heat source that your health care provider recommends, such as a moist heat pack or a heating pad. ? Place a towel between your skin and the heat source. ? Leave the heat on for 20-30 minutes. ? Remove the heat if your skin turns bright red. This is especially important if you are unable to feel pain, heat, or cold. You have a greater risk of getting burned. Activity   Do not stay in bed. Staying in bed for more than 1-2 days can delay your recovery.  Sit up and stand up straight. Avoid leaning forward when you sit, or hunching over when you stand. ? If you work at a desk, sit close to it so you do not need to lean over. Keep your chin tucked  in. Keep your neck drawn back, and keep your elbows bent at a right angle. Your arms should look like the letter "L." ? Sit high and close to the steering wheel when you drive. Add lower back (lumbar) support to your car seat, if needed.  Take short walks on even surfaces as soon as you are able. Try to increase the length of time you walk each day.  Do not sit, drive, or stand in one place for more than 30 minutes at a time. Sitting or standing for long periods of time can put stress on your back.  Do not drive or use heavy machinery while taking prescription pain medicine.  Use proper lifting techniques. When you bend and lift, use positions that put less stress on your back: ? Bend your knees. ? Keep the load close to your body. ? Avoid twisting.  Exercise regularly as told by your health care provider. Exercising helps your back heal faster and helps prevent back injuries by keeping muscles strong and flexible.  Work with a physical therapist to make a safe exercise program, as recommended by your health care provider. Do any exercises as told by your physical therapist. Lifestyle  Maintain a healthy weight. Extra weight puts stress on your back and makes it difficult to have good posture.  Avoid activities or situations that make you feel anxious or stressed. Stress and anxiety increase muscle   tension and can make back pain worse. Learn ways to manage anxiety and stress, such as through exercise. General instructions  Sleep on a firm mattress in a comfortable position. Try lying on your side with your knees slightly bent. If you lie on your back, put a pillow under your knees.  Follow your treatment plan as told by your health care provider. This may include: ? Cognitive or behavioral therapy. ? Acupuncture or massage therapy. ? Meditation or yoga. Contact a health care provider if:  You have pain that is not relieved with rest or medicine.  You have increasing pain going down  into your legs or buttocks.  Your pain does not improve after 2 weeks.  You have pain at night.  You lose weight without trying.  You have a fever or chills. Get help right away if:  You develop new bowel or bladder control problems.  You have unusual weakness or numbness in your arms or legs.  You develop nausea or vomiting.  You develop abdominal pain.  You feel faint. Summary  Acute back pain is sudden and usually short-lived.  Use proper lifting techniques. When you bend and lift, use positions that put less stress on your back.  Take over-the-counter and prescription medicines and apply heat or ice as directed by your health care provider. This information is not intended to replace advice given to you by your health care provider. Make sure you discuss any questions you have with your health care provider. Document Released: 08/01/2005 Document Revised: 11/20/2018 Document Reviewed: 03/15/2017 Elsevier Patient Education  2020 [employer]  

## 2019-06-04 ENCOUNTER — Other Ambulatory Visit: Payer: Self-pay

## 2019-06-04 ENCOUNTER — Telehealth: Payer: Self-pay | Admitting: Nurse Practitioner

## 2019-06-04 NOTE — Progress Notes (Signed)
Subjective:  Patient ID: Christina [name], female    DOB: [birth year], 68 y.o.   MRN: [id number]  Patient Care Team: Bennie Pierini, FNP as PCP - General (Nurse Practitioner) Sinda Du, MD as Referring Physician (Surgery)   Chief Complaint:  Back Pain and Urinary Tract Infection (possible )   HPI: Christina [name] is a 68 y.o. female presenting on 06/03/2019 for Back Pain and Urinary Tract Infection (possible )   Pt presents today with complaints of lower back pain that is worse with bending over and lifting. Pt states this pain started yesterday morning and gets worse throughout the day. No known injury. She denies dysuria. States she has frequency and dark colored urine. No hematuria, fever, chills, weakness, or confusion.   Back Pain This is a new problem. The current episode started yesterday. The problem occurs intermittently. The problem has been waxing and waning since onset. The pain is present in the lumbar spine. The quality of the pain is described as shooting and stabbing. The pain does not radiate. The pain is at a severity of 5/10. The pain is moderate. The pain is worse during the day. The symptoms are aggravated by bending, position and twisting. Stiffness is present all day. Pertinent negatives include no abdominal pain, bladder incontinence, bowel incontinence, chest pain, dysuria, fever, headaches, leg pain, numbness, paresis, paresthesias, pelvic pain, perianal numbness, tingling, weakness or weight loss. Risk factors include lack of exercise and menopause. She has tried nothing for the symptoms.  Urinary Tract Infection  This is a new problem. The current episode started yesterday. The patient is experiencing no pain. There has been no fever. She is not sexually active. There is no history of pyelonephritis. Associated symptoms include frequency. Pertinent negatives include no chills, discharge, flank pain, hematuria, hesitancy, nausea, possible pregnancy, sweats,  urgency or vomiting. She has tried increased fluids for the symptoms. The treatment provided no relief. renal cyst     Relevant past medical, surgical, family, and social history reviewed and updated as indicated.  Allergies and medications reviewed and updated. Date reviewed: Chart in Epic.   Past Medical History:  Diagnosis Date   Allergic rhinitis    Clotting disorder (HCC)    DVT (deep venous thrombosis) (HCC) 1990's   DVT (deep venous thrombosis) (HCC)    Epicondylitis    right    GERD (gastroesophageal reflux disease)    Hyperlipidemia    Pulmonary embolism (HCC) 1990's    Past Surgical History:  Procedure Laterality Date   CYSTECTOMY     From shoulder and neck   TUBAL LIGATION     Multiple    Social History   Socioeconomic History   Marital status: Married    Spouse name: Not on file   Number of children: 5   Years of education: Not on file   Highest education level: Not on file  Occupational History   Not on file  Social Needs   Financial resource strain: Not on file   Food insecurity    Worry: Not on file    Inability: Not on file   Transportation needs    Medical: Not on file    Non-medical: Not on file  Tobacco Use   Smoking status: Former Smoker    Quit date: 08/16/1979    Years since quitting: 39.8   Smokeless tobacco: Never Used   Tobacco comment: x33 years  Substance and Sexual Activity   Alcohol use: No    Alcohol/week: 0.0 standard  drinks   Drug use: No   Sexual activity: Not on file  Lifestyle   Physical activity    Days per week: Not on file    Minutes per session: Not on file   Stress: Not on file  Relationships   Social connections    Talks on phone: Not on file    Gets together: Not on file    Attends religious service: Not on file    Active member of club or organization: Not on file    Attends meetings of clubs or organizations: Not on file    Relationship status: Not on file   Intimate partner  violence    Fear of current or ex partner: Not on file    Emotionally abused: Not on file    Physically abused: Not on file    Forced sexual activity: Not on file  Other Topics Concern   Not on file  Social History Narrative   Married.    Outpatient Encounter Medications as of 06/03/2019  Medication Sig   amLODipine (NORVASC) 5 MG tablet TAKE 1 TABLET BY MOUTH EVERY DAY   Cholecalciferol (VITAMIN D-3) 5000 UNITS TABS Take 1 capsule by mouth daily.   olmesartan (BENICAR) 40 MG tablet TAKE 1 TABLET BY MOUTH EVERY DAY   warfarin (COUMADIN) 10 MG tablet TAKE 1 TABLET BY MOUTH EVERY DAY   colchicine 0.6 MG tablet TAKE 2 AT PAIN ONSET. MAY REPEAT 1 TABLET IN 1 HOUR IF STILL HURTING. (Patient not taking: Reported on 04/15/2019)   diclofenac sodium (VOLTAREN) 1 % GEL Apply 2 g topically 4 (four) times daily.   [EXPIRED] fosfomycin (MONUROL) 3 g PACK Take 3 g by mouth once for 1 dose.   furosemide (LASIX) 20 MG tablet Take 1 tablet (20 mg total) by mouth daily. (Patient not taking: Reported on 04/15/2019)   omeprazole (PRILOSEC) 40 MG capsule Take 1 capsule (40 mg total) by mouth daily. (Patient not taking: Reported on 06/03/2019)   [EXPIRED] methylPREDNISolone acetate (DEPO-MEDROL) injection 40 mg    No facility-administered encounter medications on file as of 06/03/2019.     Allergies  Allergen Reactions   Ace Inhibitors     Cough      Review of Systems  Constitutional: Negative for activity change, appetite change, chills, diaphoresis, fatigue, fever, unexpected weight change and weight loss.  HENT: Negative.   Eyes: Negative.  Negative for photophobia and visual disturbance.  Respiratory: Negative for cough, chest tightness and shortness of breath.   Cardiovascular: Negative for chest pain, palpitations and leg swelling.  Gastrointestinal: Negative for abdominal distention, abdominal pain, anal bleeding, blood in stool, bowel incontinence, constipation, diarrhea, nausea,  rectal pain and vomiting.  Endocrine: Negative.  Negative for polydipsia, polyphagia and polyuria.  Genitourinary: Positive for frequency. Negative for bladder incontinence, decreased urine volume, difficulty urinating, dysuria, flank pain, hematuria, hesitancy, pelvic pain, urgency, vaginal bleeding, vaginal discharge and vaginal pain.  Musculoskeletal: Positive for back pain. Negative for arthralgias, gait problem, joint swelling, myalgias, neck pain and neck stiffness.  Skin: Negative.  Negative for color change, pallor and rash.  Allergic/Immunologic: Negative.   Neurological: Negative for dizziness, tingling, tremors, seizures, syncope, facial asymmetry, speech difficulty, weakness, light-headedness, numbness, headaches and paresthesias.  Hematological: Negative.   Psychiatric/Behavioral: Negative for agitation, confusion, hallucinations, sleep disturbance and suicidal ideas.  All other systems reviewed and are negative.       Objective:  BP 115/73    Pulse 86    Temp 97.8 F (36.6 C)  Resp 20    Ht 5\' 8"  (1.727 m)    Wt 197 lb (89.4 kg)    LMP  (LMP Unknown)    SpO2 99%    BMI 29.95 kg/m    Wt Readings from Last 3 Encounters:  06/03/19 197 lb (89.4 kg)  04/15/19 203 lb (92.1 kg)  02/04/19 205 lb (93 kg)    Physical Exam Vitals signs and nursing note reviewed.  Constitutional:      General: She is not in acute distress.    Appearance: Normal appearance. She is well-developed and well-groomed. She is not ill-appearing, toxic-appearing or diaphoretic.  HENT:     Head: Normocephalic and atraumatic.     Jaw: There is normal jaw occlusion.     Right Ear: Hearing normal.     Left Ear: Hearing normal.     Nose: Nose normal.     Mouth/Throat:     Lips: Pink.     Mouth: Mucous membranes are moist.     Pharynx: Oropharynx is clear. Uvula midline.  Eyes:     General: Lids are normal.     Extraocular Movements: Extraocular movements intact.     Conjunctiva/sclera: Conjunctivae  normal.     Pupils: Pupils are equal, round, and reactive to light.  Neck:     Musculoskeletal: Normal range of motion and neck supple.     Thyroid: No thyroid mass, thyromegaly or thyroid tenderness.     Vascular: No carotid bruit or JVD.     Trachea: Trachea and phonation normal.  Cardiovascular:     Rate and Rhythm: Normal rate and regular rhythm.     Chest Wall: PMI is not displaced.     Pulses: Normal pulses.     Heart sounds: Normal heart sounds. No murmur. No friction rub. No gallop.   Pulmonary:     Effort: Pulmonary effort is normal. No respiratory distress.     Breath sounds: Normal breath sounds. No wheezing.  Abdominal:     General: Bowel sounds are normal. There is no distension or abdominal bruit.     Palpations: Abdomen is soft. There is no hepatomegaly or splenomegaly.     Tenderness: There is no abdominal tenderness. There is no right CVA tenderness or left CVA tenderness.     Hernia: No hernia is present.  Musculoskeletal:     Right hip: Normal.     Left hip: Normal.     Thoracic back: Normal.     Lumbar back: She exhibits decreased range of motion (decreased flexion due to discomfort), tenderness and pain. She exhibits no bony tenderness, no swelling, no edema, no deformity, no laceration, no spasm and normal pulse.       Back:     Right lower leg: No edema.     Left lower leg: No edema.     Comments: Negative bilateral SLR test.   Lymphadenopathy:     Cervical: No cervical adenopathy.  Skin:    General: Skin is warm and dry.     Capillary Refill: Capillary refill takes less than 2 seconds.     Coloration: Skin is not cyanotic, jaundiced or pale.     Findings: No rash.  Neurological:     General: No focal deficit present.     Mental Status: She is alert and oriented to person, place, and time.     Cranial Nerves: Cranial nerves are intact. No cranial nerve deficit.     Sensory: Sensation is intact. No sensory deficit.  Motor: Motor function is intact.  No weakness.     Coordination: Coordination is intact. Coordination normal.     Gait: Gait is intact. Gait normal.     Deep Tendon Reflexes: Reflexes are normal and symmetric. Reflexes normal.  Psychiatric:        Attention and Perception: Attention and perception normal.        Mood and Affect: Mood and affect normal.        Speech: Speech normal.        Behavior: Behavior normal. Behavior is cooperative.        Thought Content: Thought content normal.        Cognition and Memory: Cognition and memory normal.        Judgment: Judgment normal.     Results for orders placed or performed in visit on 06/03/19  Microscopic Examination   URINE  Result Value Ref Range   WBC, UA 6-10 (A) 0 - 5 /hpf   RBC 3-10 (A) 0 - 2 /hpf   Epithelial Cells (non renal) 0-10 0 - 10 /hpf   Renal Epithel, UA 0-10 (A) None seen /hpf   Bacteria, UA Few (A) None seen/Few  Urinalysis, Complete  Result Value Ref Range   Specific Gravity, UA >1.030 (H) 1.005 - 1.030   pH, UA 5.5 5.0 - 7.5   Color, UA Amber (A) Yellow   Appearance Ur Cloudy (A) Clear   Leukocytes,UA Trace (A) Negative   Protein,UA 1+ (A) Negative/Trace   Glucose, UA Negative Negative   Ketones, UA Negative Negative   RBC, UA Trace (A) Negative   Bilirubin, UA Negative Negative   Urobilinogen, Ur 1.0 0.2 - 1.0 mg/dL   Nitrite, UA Negative Negative   Microscopic Examination See below:        Pertinent labs & imaging results that were available during my care of the patient were reviewed by me and considered in my medical decision making.  Assessment & Plan:  Annise was seen today for back pain and urinary tract infection.  Diagnoses and all orders for this visit:  Dark urine -     Urine Culture -     Urinalysis, Complete -     US Abdomen Complete; Future -     Microscopic Examination  Acute midline low back pain without sciatica No red flags present for Cauda Equina Syndrome. Negative bilateral SLR test. Unable to treat with  oral NSAID therapy due to renal function and warfarin therapy. Will give burt of steroids for antiinflammatory properties and topical voltaren gel. Symptomatic care discussed in detail. Report any new, worsening, or persistent symptoms.  -     Urine Culture -     Urinalysis, Complete -     US Abdomen Complete; Future -     diclofenac sodium (VOLTAREN) 1 % GEL; Apply 2 g topically 4 (four) times daily. -     methylPREDNISolone acetate (DEPO-MEDROL) injection 40 mg  Acquired cyst of kidney New back pain and hematuria. Will order [place] for evaluation of renal cyst.  -     US Abdomen Complete; Future  Acute cystitis with hematuria Urinalysis indicative of acute cystitis with hematuria, will treat with below due to warfarin therapy. Culture pending. Will change treatment if warranted. Symptomatic care discussed. Increase water intake. Avoid bladder irritants. Repeat urinalysis in 3-4 weeks.  -     fosfomycin (MONUROL) 3 g PACK; Take 3 g by mouth once for 1 dose.    Continue all other maintenance  medications.  Follow up plan: Return in about 4 weeks (around 07/01/2019), or if symptoms worsen or fail to improve, for back pain.  Continue healthy lifestyle choices, including diet (rich in fruits, vegetables, and lean proteins, and low in salt and simple carbohydrates) and exercise (at least 30 minutes of moderate physical activity daily).  Educational handout given for back pain  The above assessment and management plan was discussed with the patient. The patient verbalized understanding of and has agreed to the management plan. Patient is aware to call the clinic if they develop any new symptoms or if symptoms persist or worsen. Patient is aware when to return to the clinic for a follow-up visit. Patient educated on when it is appropriate to go to the emergency department.   Kari BaarsMichelle Lelani Garnett, FNP-C [facility] [phone]

## 2019-06-05 ENCOUNTER — Telehealth: Payer: Self-pay | Admitting: *Deleted

## 2019-06-05 LAB — URINE CULTURE

## 2019-06-05 NOTE — Telephone Encounter (Addendum)
Prior Auth for Diclofenac Sodium Gel 1% gel-DENIED   requested drug may be covered when it is used for osteoarthritis pain in joints susceptible to topical treatment, such as feet, ankles, knees, hands, wrists, or elbows  Key: [id number]   PA Case ID: [id number]   Your information has been submitted to [employer]. To check for an updated outcome later, reopen this PA request from your dashboard.  If [employer] has not responded to your request within 24 hours, contact [employer] at [phone]. If you think there may be a problem with your PA request, use our live chat feature at the bottom right.

## 2019-06-06 ENCOUNTER — Telehealth: Payer: Self-pay | Admitting: Nurse Practitioner

## 2019-06-06 NOTE — Telephone Encounter (Signed)
Patient states she has appt with mmm she is going to wait and speak with here then

## 2019-06-06 NOTE — Telephone Encounter (Signed)
lmtcb

## 2019-06-06 NOTE — Telephone Encounter (Signed)
This is OTC now

## 2019-06-07 ENCOUNTER — Ambulatory Visit: Payer: BC Managed Care – PPO | Admitting: Nurse Practitioner

## 2019-06-07 ENCOUNTER — Telehealth: Payer: Self-pay | Admitting: Nurse Practitioner

## 2019-06-07 ENCOUNTER — Encounter: Payer: Self-pay | Admitting: Nurse Practitioner

## 2019-06-07 ENCOUNTER — Other Ambulatory Visit: Payer: Self-pay

## 2019-06-07 VITALS — BP 113/73 | HR 85 | Temp 97.1°F | Resp 18 | Ht 68.0 in | Wt 196.0 lb

## 2019-06-07 DIAGNOSIS — Z7901 Long term (current) use of anticoagulants: Secondary | ICD-10-CM | POA: Diagnosis not present

## 2019-06-07 DIAGNOSIS — Z86718 Personal history of other venous thrombosis and embolism: Secondary | ICD-10-CM

## 2019-06-07 LAB — COAGUCHEK XS/INR WAIVED
INR: 2.8 — ABNORMAL HIGH (ref 0.9–1.1)
Prothrombin Time: 33.9 s

## 2019-06-07 NOTE — Progress Notes (Signed)
Subjective:   Chief Complaint:  INR recheck   Indication: DVT Bleeding signs/symptoms: None Thromboembolic signs/symptoms: None  Missed Coumadin doses: None Medication changes: no Dietary changes: no Bacterial/viral infection: no Other concerns: no  The following portions of the patient's history were reviewed and updated as appropriate: allergies, current medications, past family history, past medical history, past social history, past surgical history and problem list.  Review of Systems Pertinent items noted in HPI and remainder of comprehensive ROS otherwise negative.   Objective:    INR Today: 2.8 Current dose: coumadin 5mg  daily except 10mg  daily    Assessment:    Therapeutic INR for goal of 2-3   Plan:    1. New dose: no change   2. Next INR: 1 month    Mary-Margaret Hassell Done, FNP

## 2019-06-10 ENCOUNTER — Other Ambulatory Visit: Payer: Self-pay

## 2019-06-10 ENCOUNTER — Ambulatory Visit (HOSPITAL_COMMUNITY)
Admission: RE | Admit: 2019-06-10 | Discharge: 2019-06-10 | Disposition: A | Discharge status: Home / Self Care | Payer: BC Managed Care – PPO | Source: Ambulatory Visit | Attending: Family Medicine | Admitting: Family Medicine

## 2019-06-10 DIAGNOSIS — M545 Low back pain, unspecified: Secondary | ICD-10-CM

## 2019-06-10 DIAGNOSIS — N281 Cyst of kidney, acquired: Secondary | ICD-10-CM

## 2019-06-10 DIAGNOSIS — R82998 Other abnormal findings in urine: Secondary | ICD-10-CM | POA: Diagnosis not present

## 2019-06-24 ENCOUNTER — Other Ambulatory Visit: Payer: Self-pay | Admitting: Physician Assistant

## 2019-06-24 DIAGNOSIS — I10 Essential (primary) hypertension: Secondary | ICD-10-CM

## 2019-06-28 ENCOUNTER — Other Ambulatory Visit: Payer: Self-pay | Admitting: *Deleted

## 2019-06-28 DIAGNOSIS — Z20822 Contact with and (suspected) exposure to covid-19: Secondary | ICD-10-CM

## 2019-07-01 ENCOUNTER — Telehealth: Payer: Self-pay | Admitting: *Deleted

## 2019-07-01 LAB — NOVEL CORONAVIRUS, NAA: SARS-CoV-2, NAA: NOT DETECTED

## 2019-07-01 NOTE — Telephone Encounter (Signed)
Notified of negative COVID result- patient advised to open MyChart- to print result for work.

## 2019-07-05 ENCOUNTER — Ambulatory Visit: Payer: Self-pay | Admitting: Nurse Practitioner

## 2019-07-16 ENCOUNTER — Other Ambulatory Visit: Payer: Self-pay

## 2019-07-17 ENCOUNTER — Ambulatory Visit: Payer: BC Managed Care – PPO | Admitting: Nurse Practitioner

## 2019-07-17 ENCOUNTER — Encounter: Payer: Self-pay | Admitting: Nurse Practitioner

## 2019-07-17 VITALS — BP 110/67 | HR 80 | Temp 97.3°F | Resp 20 | Ht 68.0 in | Wt 199.0 lb

## 2019-07-17 DIAGNOSIS — Z7901 Long term (current) use of anticoagulants: Secondary | ICD-10-CM | POA: Diagnosis not present

## 2019-07-17 DIAGNOSIS — Z86718 Personal history of other venous thrombosis and embolism: Secondary | ICD-10-CM

## 2019-07-17 LAB — COAGUCHEK XS/INR WAIVED
INR: 3.1 — ABNORMAL HIGH (ref 0.9–1.1)
Prothrombin Time: 37.7 s

## 2019-07-17 NOTE — Progress Notes (Signed)
Subjective:    Chief Complaint: INR recheck    Indication: DVT Bleeding signs/symptoms: None Thromboembolic signs/symptoms: None  Missed Coumadin doses: None Medication changes: no Dietary changes: no Bacterial/viral infection: no Other concerns: no  The following portions of the patient's history were reviewed and updated as appropriate: allergies, current medications, past family history, past medical history, past social history, past surgical history and problem list.  Review of Systems Pertinent items noted in HPI and remainder of comprehensive ROS otherwise negative.   Objective:    INR Today: 3.1 Current dose: coumadin 5mg  daily except 10mg  on fridays    Assessment:    Supratherapeutic INR for goal of 2-3   Plan:    1. New dose: no change   2. Next INR: 1 month    Mary-Margaret Hassell Done, FNP

## 2019-07-20 ENCOUNTER — Other Ambulatory Visit: Payer: Self-pay | Admitting: Nurse Practitioner

## 2019-07-20 DIAGNOSIS — I10 Essential (primary) hypertension: Secondary | ICD-10-CM

## 2019-08-01 ENCOUNTER — Other Ambulatory Visit: Payer: Self-pay | Admitting: Nurse Practitioner

## 2019-08-01 DIAGNOSIS — Z7901 Long term (current) use of anticoagulants: Secondary | ICD-10-CM

## 2019-08-02 ENCOUNTER — Encounter: Payer: Self-pay | Admitting: Nurse Practitioner

## 2019-08-02 ENCOUNTER — Ambulatory Visit (INDEPENDENT_AMBULATORY_CARE_PROVIDER_SITE_OTHER): Payer: BC Managed Care – PPO | Admitting: Nurse Practitioner

## 2019-08-02 DIAGNOSIS — K219 Gastro-esophageal reflux disease without esophagitis: Secondary | ICD-10-CM | POA: Diagnosis not present

## 2019-08-02 MED ORDER — PANTOPRAZOLE SODIUM 40 MG PO TBEC: 40.0000 mg | DELAYED_RELEASE_TABLET | Freq: Every day | ORAL | 3 refills | Status: DC

## 2019-08-02 NOTE — Progress Notes (Signed)
   Virtual Visit via telephone Note Due to COVID-19 pandemic this visit was conducted virtually. This visit type was conducted due to national recommendations for restrictions regarding the COVID-19 Pandemic (e.g. social distancing, sheltering in place) in an effort to limit this patient's exposure and mitigate transmission in our community. All issues noted in this document were discussed and addressed.  A physical exam was not performed with this format.  I connected with Christina [name] on 08/02/19 at 2:25 by telephone and verified that I am speaking with the correct person using two identifiers. Christina [name] is currently located at home and her husband is currently with her during visit. The provider, Mary-Margaret Hassell Done, FNP is located in their office at time of visit.  I discussed the limitations, risks, security and privacy concerns of performing an evaluation and management service by telephone and the availability of in person appointments. I also discussed with the patient that there may be a patient responsible charge related to this service. The patient expressed understanding and agreed to proceed.   History and Present Illness:   Chief Complaint: Gastroesophageal Reflux   HPI Patient has been having gastric reflux  At night. Has been waking her up at night with burning in the back of her throat. She is on omeprazole and that is not helping   Review of Systems  Constitutional: Negative for diaphoresis and weight loss.  Eyes: Negative for blurred vision, double vision and pain.  Respiratory: Negative for shortness of breath.   Cardiovascular: Negative for chest pain, palpitations, orthopnea and leg swelling.  Gastrointestinal: Negative for abdominal pain.  Skin: Negative for rash.  Neurological: Negative for dizziness, sensory change, loss of consciousness, weakness and headaches.  Endo/Heme/Allergies: Negative for polydipsia. Does not bruise/bleed easily.    Psychiatric/Behavioral: Negative for memory loss. The patient does not have insomnia.   All other systems reviewed and are negative.    Observations/Objective: Alert and oriented- answers all questions appropriately No distress    Assessment and Plan: Ailani Strebel in today with chief complaint of Gastroesophageal Reflux   1. Gastroesophageal reflux disease without esophagitis Avoid spicy foods Do not eat 2 hours prior to bedtime - pantoprazole (PROTONIX) 40 MG tablet; Take 1 tablet (40 mg total) by mouth daily.  Dispense: 30 tablet; Refill: 3   Follow Up Instructions: prn    I discussed the assessment and treatment plan with the patient. The patient was provided an opportunity to ask questions and all were answered. The patient agreed with the plan and demonstrated an understanding of the instructions.   The patient was advised to call back or seek an in-person evaluation if the symptoms worsen or if the condition fails to improve as anticipated.  The above assessment and management plan was discussed with the patient. The patient verbalized understanding of and has agreed to the management plan. Patient is aware to call the clinic if symptoms persist or worsen. Patient is aware when to return to the clinic for a follow-up visit. Patient educated on when it is appropriate to go to the emergency department.   Time call ended:  2:37  I provided 12 minutes of non-face-to-face time during this encounter.    Mary-Margaret Hassell Done, FNP

## 2019-08-10 ENCOUNTER — Other Ambulatory Visit: Payer: Self-pay | Admitting: Nurse Practitioner

## 2019-08-10 DIAGNOSIS — K219 Gastro-esophageal reflux disease without esophagitis: Secondary | ICD-10-CM

## 2019-08-13 ENCOUNTER — Other Ambulatory Visit: Payer: Self-pay

## 2019-08-13 NOTE — H&P (Signed)
Patient's anticipated LOS is less than 2 midnights, meeting these requirements: - Younger than 6 - Lives within 1 hour of care - Has a competent adult at home to recover with post-op recover - NO history of  - Chronic pain requiring opiods  - Diabetes  - Coronary Artery Disease  - Heart failure  - Heart attack  - Stroke  - DVT/VTE  - Cardiac arrhythmia  - Respiratory Failure/COPD  - Renal failure  - Anemia  - Advanced Liver disease       Christina [name] is an 68 y.o. female.    Chief Complaint: right knee pain  HPI: Pt is a 68 y.o. female complaining of right knee pain for multiple years. Pain had continually increased since the beginning. X-rays in the clinic show end-stage arthritic changes of the right knee. Pt has tried various conservative treatments which have failed to alleviate their symptoms, including injections and therapy. Various options are discussed with the patient. Risks, benefits and expectations were discussed with the patient. Patient understand the risks, benefits and expectations and wishes to proceed with surgery.   PCP:  Bennie Pierini, FNP  D/C Plans: Home  PMH: Past Medical History:  Diagnosis Date  . Allergic rhinitis   . Clotting disorder (HCC)   . DVT (deep venous thrombosis) (HCC) 1990's  . DVT (deep venous thrombosis) (HCC)   . Epicondylitis    right   . GERD (gastroesophageal reflux disease)   . Hyperlipidemia   . Pulmonary embolism (HCC) 1990's    PSH: Past Surgical History:  Procedure Laterality Date  . CYSTECTOMY     From shoulder and neck  . TUBAL LIGATION     Multiple    Social History:  reports that she quit smoking about 40 years ago. She has never used smokeless tobacco. She reports that she does not drink alcohol or use drugs.  Allergies:  Allergies  Allergen Reactions  . Ace Inhibitors     Cough      Medications: No current facility-administered medications for this encounter.   Current Outpatient  Medications  Medication Sig Dispense Refill  . amLODipine (NORVASC) 5 MG tablet TAKE 1 TABLET BY MOUTH EVERY DAY 90 tablet 04  . Cholecalciferol (VITAMIN D-3) 5000 UNITS TABS Take 1 capsule by mouth daily.    . colchicine 0.6 MG tablet TAKE 2 AT PAIN ONSET. MAY REPEAT 1 TABLET IN 1 HOUR IF STILL HURTING. (Patient not taking: Reported on 04/15/2019) 30 tablet 2  . diclofenac sodium (VOLTAREN) 1 % GEL Apply 2 g topically 4 (four) times daily. (Patient not taking: Reported on 07/17/2019) 350 g 0  . furosemide (LASIX) 20 MG tablet Take 1 tablet (20 mg total) by mouth daily. 90 tablet 1  . olmesartan (BENICAR) 40 MG tablet TAKE 1 TABLET BY MOUTH EVERY DAY 30 tablet 0  . omeprazole (PRILOSEC) 40 MG capsule TAKE 1 CAPSULE BY MOUTH EVERY DAY 90 capsule 1  . pantoprazole (PROTONIX) 40 MG tablet Take 1 tablet (40 mg total) by mouth daily. 30 tablet 3  . warfarin (COUMADIN) 10 MG tablet TAKE 1 TABLET BY MOUTH EVERY DAY 90 tablet 1    No results found for this or any previous visit (from the past 48 hour(s)). No results found.  ROS: Pain with rom of the right lower extremity  Physical Exam: Alert and oriented 68 y.o. female in no acute distress Cranial nerves 2-12 intact Cervical spine: full rom with no tenderness, nv intact distally Chest: active breath sounds  bilaterally, no wheeze rhonchi or rales Heart: regular rate and rhythm, no murmur Abd: non tender non distended with active bowel sounds Hip is stable with rom  Right knee with crepitus with rom Painful rom Antalgic gait  Assessment/Plan Assessment: right knee end stage osteoarthritis  Plan:  Patient will undergo a right total knee by Dr. Veverly Fells at [facility]. Risks benefits and expectations were discussed with the patient. Patient understand risks, benefits and expectations and wishes to proceed. Preoperative templating of the joint replacement has been completed, documented, and submitted to the Operating Room personnel in order to  optimize intra-operative equipment management.   Merla Riches PA-C, MPAS [facility] is now [facility] [address] [place], [place], [place] [address] Phone: [phone] [email or web address] Facebook  [employer]

## 2019-08-14 ENCOUNTER — Encounter: Payer: Self-pay | Admitting: Nurse Practitioner

## 2019-08-14 ENCOUNTER — Ambulatory Visit: Payer: BC Managed Care – PPO | Admitting: Nurse Practitioner

## 2019-08-14 ENCOUNTER — Encounter (HOSPITAL_COMMUNITY): Payer: Self-pay

## 2019-08-14 VITALS — BP 107/68 | HR 64 | Temp 96.8°F | Resp 20 | Ht 68.0 in | Wt 189.0 lb

## 2019-08-14 DIAGNOSIS — Z7901 Long term (current) use of anticoagulants: Secondary | ICD-10-CM

## 2019-08-14 DIAGNOSIS — Z86718 Personal history of other venous thrombosis and embolism: Secondary | ICD-10-CM | POA: Diagnosis not present

## 2019-08-14 DIAGNOSIS — Z23 Encounter for immunization: Secondary | ICD-10-CM | POA: Diagnosis not present

## 2019-08-14 LAB — COAGUCHEK XS/INR WAIVED
INR: 3.2 — ABNORMAL HIGH (ref 0.9–1.1)
Prothrombin Time: 38.6 s

## 2019-08-14 NOTE — Progress Notes (Signed)
Subjective:   Chief Complaint: INR recheck   Indication: DVT Bleeding signs/symptoms: None Thromboembolic signs/symptoms: None  Missed Coumadin doses: None Medication changes: no Dietary changes: no Bacterial/viral infection: no Other concerns: no  The following portions of the patient's history were reviewed and updated as appropriate: allergies, current medications, past family history, past medical history, past social history, past surgical history and problem list.  Review of Systems Pertinent items noted in HPI and remainder of comprehensive ROS otherwise negative.   Objective:    INR Today: 3.2 Current dose: coumadin 5mg  daily except 10mg  on fridays    Assessment:    Supratherapeutic INR for goal of 2-3   Plan:    1. New dose: no change  - but stop coumadin 5 days prior to TKR 2. Next INR: 1 month    Mary-Margaret Hassell Done, FNP

## 2019-08-14 NOTE — Patient Instructions (Addendum)
DUE TO COVID-19 ONLY ONE VISITOR IS ALLOWED TO COME WITH YOU AND STAY IN THE WAITING ROOM ONLY DURING PRE OP AND PROCEDURE. THE ONE VISITOR MAY VISIT WITH YOU IN YOUR PRIVATE ROOM DURING VISITING HOURS ONLY!!   YOU NEED TO HAVE A COVID 19 TEST ON___Tuesday, January 5th, 2021____ @___12 :00PM____, THIS TEST MUST BE DONE BEFORE SURGERY, COME  [address], [place] , [phone].  [facility]) ONCE YOUR COVID TEST IS COMPLETED, PLEASE BEGIN THE QUARANTINE INSTRUCTIONS AS OUTLINED IN YOUR HANDOUT.        Your procedure is scheduled on: Friday, Jan. 8, 2021   [facility] Main  Entrance   Report to Short Stay at 5:30 AM   Call this number if you have problems the morning of surgery [phone]   Do not eat food:After Midnight. You may have liquids until 4:30 AM day of surgery. Complete one Ensure drink the morning of surgery at 4:30AM  the day of surgery.     CLEAR LIQUID DIET  Foods Allowed                                                                     Foods Excluded  Water, Black Coffee and tea, regular and decaf                             liquids that you cannot  Plain Jell-O in any flavor  (No red)                                           see through such as: Fruit ices (not with fruit pulp)                                     milk, soups, orange juice  Iced Popsicles (No red)                                    All solid food Carbonated beverages, regular and diet                                    Apple juices Sports drinks like Gatorade (No red) Lightly seasoned clear broth or consume(fat free) Sugar, honey syrup  Sample Menu Breakfast                                Lunch                                     Supper Cranberry juice                    Beef broth  Chicken broth Jell-O                                     Grape juice                           Apple juice Coffee or tea                         Jell-O                                      Popsicle                                                Coffee or tea                        Coffee or tea     Brush your teeth the morning of surgery.   Do NOT smoke after Midnight   Take these medicines the morning of surgery with A SIP OF WATER: Amlodipine, Pantoprazole                               You may not have any metal on your body including hair pins, jewelry, and body piercings             Do not wear make-up, lotions, powders, perfumes/cologne, or deodorant             Do not wear nail polish.  Do not shave  48 hours prior to surgery.              Do not bring valuables to the hospital.  IS NOT             RESPONSIBLE   FOR VALUABLES.   Contacts, dentures or bridgework may not be worn into surgery.   Bring small overnight bag day of surgery.    Patients discharged the day of surgery will not be allowed to drive home.   Special Instructions: Bring a copy of your healthcare power of attorney and living will documents         the day of surgery if you haven't scanned them in before.              Please read over the following fact sheets you were given:    [facility] - Preparing for Surgery Before surgery, you can play an important role.  Because skin is not sterile, your skin needs to be as free of germs as possible.  You can reduce the number of germs on your skin by washing with CHG (chlorahexidine gluconate) soap before surgery.  CHG is an antiseptic cleaner which kills germs and bonds with the skin to continue killing germs even after washing. Please DO NOT use if you have an allergy to CHG or antibacterial soaps.  If your skin becomes reddened/irritated stop using the CHG and inform your nurse when you arrive at Short Stay. Do not shave (including legs and underarms) for at least 48 hours prior to the  first CHG shower.  You may shave your face/neck. Please follow these instructions carefully:  1.  Shower with  CHG Soap the night before surgery and the  morning of Surgery.  2.  If you choose to wash your hair, wash your hair first as usual with your  normal  shampoo.  3.  After you shampoo, rinse your hair and body thoroughly to remove the  shampoo.                           4.  Use CHG as you would any other liquid soap.  You can apply chg directly  to the skin and wash                       Gently with a scrungie or clean washcloth.  5.  Apply the CHG Soap to your body ONLY FROM THE NECK DOWN.   Do not use on face/ open                           Wound or open sores. Avoid contact with eyes, ears mouth and genitals (private parts).                       Wash face,  Genitals (private parts) with your normal soap.             6.  Wash thoroughly, paying special attention to the area where your surgery  will be performed.  7.  Thoroughly rinse your body with warm water from the neck down.  8.  DO NOT shower/wash with your normal soap after using and rinsing off  the CHG Soap.                9.  Pat yourself dry with a clean towel.            10.  Wear clean pajamas.            11.  Place clean sheets on your bed the night of your first shower and do not  sleep with pets. Day of Surgery : Do not apply any lotions/deodorants the morning of surgery.  Please wear clean clothes to the hospital/surgery center.  FAILURE TO FOLLOW THESE INSTRUCTIONS MAY RESULT IN THE CANCELLATION OF YOUR SURGERY PATIENT SIGNATURE_________________________________  NURSE SIGNATURE__________________________________  ________________________________________________________________________    Christina [name]  An incentive spirometer is a tool that can help keep your lungs clear and active. This tool measures how well you are filling your lungs with each breath. Taking long deep breaths may help reverse or decrease the chance of developing breathing (pulmonary) problems (especially infection) following:  A long period of  time when you are unable to move or be active. BEFORE THE PROCEDURE   If the spirometer includes an indicator to show your best effort, your nurse or respiratory therapist will set it to a desired goal.  If possible, sit up straight or lean slightly forward. Try not to slouch.  Hold the incentive spirometer in an upright position. INSTRUCTIONS FOR USE  1. Sit on the edge of your bed if possible, or sit up as far as you can in bed or on a chair. 2. Hold the incentive spirometer in an upright position. 3. Breathe out normally. 4. Place the mouthpiece in your mouth and seal your lips tightly around it. 5. Breathe in slowly  and as deeply as possible, raising the piston or the ball toward the top of the column. 6. Hold your breath for 3-5 seconds or for as long as possible. Allow the piston or ball to fall to the bottom of the column. 7. Remove the mouthpiece from your mouth and breathe out normally. 8. Rest for a few seconds and repeat Steps 1 through 7 at least 10 times every 1-2 hours when you are awake. Take your time and take a few normal breaths between deep breaths. 9. The spirometer may include an indicator to show your best effort. Use the indicator as a goal to work toward during each repetition. 10. After each set of 10 deep breaths, practice coughing to be sure your lungs are clear. If you have an incision (the cut made at the time of surgery), support your incision when coughing by placing a pillow or rolled up towels firmly against it. Once you are able to get out of bed, walk around indoors and cough well. You may stop using the incentive spirometer when instructed by your caregiver.  RISKS AND COMPLICATIONS  Take your time so you do not get dizzy or light-headed.  If you are in pain, you may need to take or ask for pain medication before doing incentive spirometry. It is harder to take a deep breath if you are having pain. AFTER USE  Rest and breathe slowly and easily.  It can  be helpful to keep track of a log of your progress. Your caregiver can provide you with a simple table to help with this. If you are using the spirometer at home, follow these instructions: [place] IF:   You are having difficultly using the spirometer.  You have trouble using the spirometer as often as instructed.  Your pain medication is not giving enough relief while using the spirometer.  You develop fever of 100.5 F (38.1 C) or higher. SEEK IMMEDIATE MEDICAL CARE IF:   You cough up bloody sputum that had not been present before.  You develop fever of 102 F (38.9 C) or greater.  You develop worsening pain at or near the incision site. MAKE SURE YOU:   Understand these instructions.  Will watch your condition.  Will get help right away if you are not doing well or get worse. Document Released: 12/12/2006 Document Revised: 10/24/2011 Document Reviewed: 02/12/2007 [facility] Patient Information 2014 [place], [place].   ________________________________________________________________________

## 2019-08-19 ENCOUNTER — Other Ambulatory Visit: Payer: Self-pay

## 2019-08-19 ENCOUNTER — Encounter (HOSPITAL_COMMUNITY)
Admission: RE | Admit: 2019-08-19 | Discharge: 2019-08-19 | Disposition: A | Discharge status: Home / Self Care | Payer: BC Managed Care – PPO | Source: Ambulatory Visit | Attending: Orthopedic Surgery | Admitting: Orthopedic Surgery

## 2019-08-19 ENCOUNTER — Encounter (HOSPITAL_COMMUNITY): Payer: Self-pay

## 2019-08-19 ENCOUNTER — Other Ambulatory Visit: Payer: Self-pay | Admitting: Nurse Practitioner

## 2019-08-19 DIAGNOSIS — Z01818 Encounter for other preprocedural examination: Secondary | ICD-10-CM | POA: Insufficient documentation

## 2019-08-19 DIAGNOSIS — R9431 Abnormal electrocardiogram [ECG] [EKG]: Secondary | ICD-10-CM | POA: Diagnosis not present

## 2019-08-19 DIAGNOSIS — I10 Essential (primary) hypertension: Secondary | ICD-10-CM

## 2019-08-19 HISTORY — DX: Unspecified osteoarthritis, unspecified site: M19.90

## 2019-08-19 HISTORY — DX: Essential (primary) hypertension: I10

## 2019-08-19 LAB — BASIC METABOLIC PANEL
Anion gap: 9 (ref 5–15)
BUN: 12 mg/dL (ref 8–23)
CO2: 23 mmol/L (ref 22–32)
Calcium: 9.2 mg/dL (ref 8.9–10.3)
Chloride: 110 mmol/L (ref 98–111)
Creatinine, Ser: 1 mg/dL (ref 0.44–1.00)
GFR calc Af Amer: >60 mL/min (ref >60)
GFR calc non Af Amer: 57 mL/min — ABNORMAL LOW (ref >60)
Glucose, Bld: 92 mg/dL (ref 70–99)
Potassium: 3.9 mmol/L (ref 3.5–5.1)
Sodium: 142 mmol/L (ref 135–145)

## 2019-08-19 LAB — CBC
HCT: 44.5 % (ref 36.0–46.0)
Hemoglobin: 13.9 g/dL (ref 12.0–15.0)
MCH: 25.5 pg — ABNORMAL LOW (ref 26.0–34.0)
MCHC: 31.2 g/dL (ref 30.0–36.0)
MCV: 81.7 fL (ref 80.0–100.0)
Platelets: 284 10*3/uL (ref 150–400)
RBC: 5.45 MIL/uL — ABNORMAL HIGH (ref 3.87–5.11)
RDW: 13.2 % (ref 11.5–15.5)
WBC: 5.6 10*3/uL (ref 4.0–10.5)
nRBC: 0 % (ref 0.0–0.2)

## 2019-08-19 LAB — SURGICAL PCR SCREEN
MRSA, PCR: NEGATIVE
Staphylococcus aureus: POSITIVE — AB

## 2019-08-19 NOTE — Progress Notes (Signed)
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 On coumadin , instructed by PCP to hold x5 days prior to surgery. Patient reports last dose was 12/2.   Patient denies shortness of breath, fever, cough and chest pain at PAT appointment   Patient verbalized understanding of instructions that were given to them at the PAT appointment. Patient was also instructed that they will need to review over the PAT instructions again at home before surgery.

## 2019-08-20 ENCOUNTER — Other Ambulatory Visit (HOSPITAL_COMMUNITY)
Admission: RE | Admit: 2019-08-20 | Discharge: 2019-08-20 | Disposition: A | Discharge status: Home / Self Care | Payer: BC Managed Care – PPO | Source: Ambulatory Visit | Attending: Orthopedic Surgery | Admitting: Orthopedic Surgery

## 2019-08-20 DIAGNOSIS — Z20822 Contact with and (suspected) exposure to covid-19: Secondary | ICD-10-CM | POA: Diagnosis not present

## 2019-08-20 DIAGNOSIS — Z01812 Encounter for preprocedural laboratory examination: Secondary | ICD-10-CM | POA: Insufficient documentation

## 2019-08-21 LAB — NOVEL CORONAVIRUS, NAA (HOSP ORDER, SEND-OUT TO REF LAB; TAT 18-24 HRS): SARS-CoV-2, NAA: NOT DETECTED

## 2019-08-22 NOTE — Anesthesia Preprocedure Evaluation (Addendum)
Anesthesia Evaluation  Patient identified by MRN, date of birth, ID band Patient awake    Reviewed: Allergy & Precautions, NPO status , Patient's Chart, lab work & pertinent test results  Airway Mallampati: II  TM Distance: >3 FB     Dental  (+) Dental Advisory Given   Pulmonary former smoker,    breath sounds clear to auscultation       Cardiovascular hypertension, Pt. on medications + DVT   Rhythm:Regular Rate:Normal     Neuro/Psych negative neurological ROS  negative psych ROS   GI/Hepatic Neg liver ROS, GERD  Medicated,  Endo/Other  negative endocrine ROS  Renal/GU Renal disease     Musculoskeletal  (+) Arthritis ,   Abdominal   Peds  Hematology negative hematology ROS (+)   Anesthesia Other Findings   Reproductive/Obstetrics                            Lab Results  Component Value Date   WBC 5.6 08/19/2019   HGB 13.9 08/19/2019   HCT 44.5 08/19/2019   MCV 81.7 08/19/2019   PLT 284 08/19/2019   Lab Results  Component Value Date   CREATININE 1.00 08/19/2019   BUN 12 08/19/2019   NA 142 08/19/2019   K 3.9 08/19/2019   CL 110 08/19/2019   CO2 23 08/19/2019    Anesthesia Physical Anesthesia Plan  ASA: II  Anesthesia Plan: General   Post-op Pain Management:  Regional for Post-op pain   Induction: Intravenous  PONV Risk Score and Plan: 3 and Ondansetron, Dexamethasone, Treatment may vary due to age or medical condition and Midazolam  Airway Management Planned: LMA  Additional Equipment:   Intra-op Plan:   Post-operative Plan: Extubation in OR  Informed Consent: I have reviewed the patients History and Physical, chart, labs and discussed the procedure including the risks, benefits and alternatives for the proposed anesthesia with the patient or authorized representative who has indicated his/her understanding and acceptance.     Dental advisory given  Plan  Discussed with: CRNA  Anesthesia Plan Comments: (Pt refused spinal. )       Anesthesia Quick Evaluation

## 2019-08-23 ENCOUNTER — Ambulatory Visit (HOSPITAL_COMMUNITY): Payer: BC Managed Care – PPO | Admitting: Physician Assistant

## 2019-08-23 ENCOUNTER — Other Ambulatory Visit: Payer: Self-pay

## 2019-08-23 ENCOUNTER — Ambulatory Visit (HOSPITAL_COMMUNITY): Payer: BC Managed Care – PPO | Admitting: Anesthesiology

## 2019-08-23 ENCOUNTER — Ambulatory Visit (HOSPITAL_COMMUNITY)
Admission: RE | Admit: 2019-08-23 | Discharge: 2019-08-25 | Disposition: A | Discharge status: Home Health | Payer: BC Managed Care – PPO | Attending: Orthopedic Surgery | Admitting: Orthopedic Surgery

## 2019-08-23 ENCOUNTER — Encounter (HOSPITAL_COMMUNITY): Payer: Self-pay | Admitting: Orthopedic Surgery

## 2019-08-23 ENCOUNTER — Encounter (HOSPITAL_COMMUNITY)
Admission: RE | Disposition: A | Discharge status: Home Health | Payer: Self-pay | Source: Home / Self Care | Attending: Orthopedic Surgery

## 2019-08-23 DIAGNOSIS — Z86718 Personal history of other venous thrombosis and embolism: Secondary | ICD-10-CM | POA: Diagnosis not present

## 2019-08-23 DIAGNOSIS — Z7901 Long term (current) use of anticoagulants: Secondary | ICD-10-CM | POA: Diagnosis not present

## 2019-08-23 DIAGNOSIS — K219 Gastro-esophageal reflux disease without esophagitis: Secondary | ICD-10-CM | POA: Insufficient documentation

## 2019-08-23 DIAGNOSIS — M1711 Unilateral primary osteoarthritis, right knee: Principal | ICD-10-CM | POA: Insufficient documentation

## 2019-08-23 DIAGNOSIS — I10 Essential (primary) hypertension: Secondary | ICD-10-CM | POA: Insufficient documentation

## 2019-08-23 DIAGNOSIS — Z86711 Personal history of pulmonary embolism: Secondary | ICD-10-CM | POA: Insufficient documentation

## 2019-08-23 DIAGNOSIS — E785 Hyperlipidemia, unspecified: Secondary | ICD-10-CM | POA: Diagnosis not present

## 2019-08-23 DIAGNOSIS — Z96651 Presence of right artificial knee joint: Secondary | ICD-10-CM

## 2019-08-23 DIAGNOSIS — Z87891 Personal history of nicotine dependence: Secondary | ICD-10-CM | POA: Diagnosis not present

## 2019-08-23 DIAGNOSIS — Z96652 Presence of left artificial knee joint: Secondary | ICD-10-CM

## 2019-08-23 DIAGNOSIS — Z79899 Other long term (current) drug therapy: Secondary | ICD-10-CM | POA: Insufficient documentation

## 2019-08-23 DIAGNOSIS — M25761 Osteophyte, right knee: Secondary | ICD-10-CM | POA: Diagnosis not present

## 2019-08-23 DIAGNOSIS — M25561 Pain in right knee: Secondary | ICD-10-CM | POA: Diagnosis present

## 2019-08-23 HISTORY — PX: TOTAL KNEE ARTHROPLASTY: SHX125

## 2019-08-23 LAB — PROTIME-INR
INR: 1.1 (ref 0.8–1.2)
Prothrombin Time: 14.6 seconds (ref 11.4–15.2)

## 2019-08-23 SURGERY — ARTHROPLASTY, KNEE, TOTAL
Anesthesia: General | Site: Knee | Laterality: Right

## 2019-08-23 MED ORDER — POVIDONE-IODINE 10 % EX SWAB
2.0000 "application " | Freq: Once | CUTANEOUS | Status: AC
  Administered 2019-08-23: 2 via TOPICAL

## 2019-08-23 MED ORDER — PHENOL 1.4 % MT LIQD
1.0000 | OROMUCOSAL | Status: DC | PRN
  Filled 2019-08-23: qty 177

## 2019-08-23 MED ORDER — FENTANYL CITRATE (PF) 100 MCG/2ML IJ SOLN
INTRAMUSCULAR | Status: AC
  Filled 2019-08-23: qty 2

## 2019-08-23 MED ORDER — CHLORHEXIDINE GLUCONATE 4 % EX LIQD: 60.0000 mL | Freq: Once | CUTANEOUS | Status: DC

## 2019-08-23 MED ORDER — METHOCARBAMOL 500 MG PO TABS: 500.0000 mg | ORAL_TABLET | Freq: Three times a day (TID) | ORAL | 1 refills | Status: DC | PRN

## 2019-08-23 MED ORDER — WARFARIN - PHARMACIST DOSING INPATIENT: Freq: Every day | Status: DC

## 2019-08-23 MED ORDER — FERROUS SULFATE 325 (65 FE) MG PO TABS
325.0000 mg | ORAL_TABLET | Freq: Three times a day (TID) | ORAL | Status: DC
  Administered 2019-08-23 – 2019-08-25 (×5): 325 mg via ORAL
  Filled 2019-08-23 (×6): qty 1

## 2019-08-23 MED ORDER — HYDROMORPHONE HCL 2 MG/ML IJ SOLN
INTRAMUSCULAR | Status: AC
  Filled 2019-08-23: qty 1

## 2019-08-23 MED ORDER — ONDANSETRON HCL 4 MG/2ML IJ SOLN
INTRAMUSCULAR | Status: DC | PRN
  Administered 2019-08-23: 4 mg via INTRAVENOUS

## 2019-08-23 MED ORDER — DEXAMETHASONE SODIUM PHOSPHATE 10 MG/ML IJ SOLN
INTRAMUSCULAR | Status: DC | PRN
  Administered 2019-08-23: 10 mg via INTRAVENOUS

## 2019-08-23 MED ORDER — MIDAZOLAM HCL 5 MG/5ML IJ SOLN
INTRAMUSCULAR | Status: DC | PRN
  Administered 2019-08-23: 2 mg via INTRAVENOUS

## 2019-08-23 MED ORDER — HYDROMORPHONE HCL 1 MG/ML IJ SOLN
0.2500 mg | INTRAMUSCULAR | Status: DC | PRN
  Administered 2019-08-23: 11:00:00 0.5 mg via INTRAVENOUS

## 2019-08-23 MED ORDER — SODIUM CHLORIDE 0.9 % IV SOLN: INTRAVENOUS | Status: DC

## 2019-08-23 MED ORDER — KETAMINE HCL 10 MG/ML IJ SOLN
INTRAMUSCULAR | Status: AC
  Filled 2019-08-23: qty 1

## 2019-08-23 MED ORDER — MIDAZOLAM HCL 2 MG/2ML IJ SOLN
INTRAMUSCULAR | Status: AC
  Filled 2019-08-23: qty 2

## 2019-08-23 MED ORDER — IRBESARTAN 150 MG PO TABS
300.0000 mg | ORAL_TABLET | Freq: Every day | ORAL | Status: DC
  Administered 2019-08-24 – 2019-08-25 (×2): 300 mg via ORAL
  Filled 2019-08-23 (×2): qty 2

## 2019-08-23 MED ORDER — SODIUM CHLORIDE 0.9 % IR SOLN
Status: DC | PRN
  Administered 2019-08-23: 1000 mL

## 2019-08-23 MED ORDER — PHENYLEPHRINE 40 MCG/ML (10ML) SYRINGE FOR IV PUSH (FOR BLOOD PRESSURE SUPPORT)
PREFILLED_SYRINGE | INTRAVENOUS | Status: DC | PRN
  Administered 2019-08-23 (×2): 80 ug via INTRAVENOUS
  Administered 2019-08-23 (×2): 120 ug via INTRAVENOUS

## 2019-08-23 MED ORDER — LIDOCAINE 2% (20 MG/ML) 5 ML SYRINGE
INTRAMUSCULAR | Status: DC | PRN
  Administered 2019-08-23: 20 mg via INTRAVENOUS

## 2019-08-23 MED ORDER — OXYCODONE-ACETAMINOPHEN 5-325 MG PO TABS: 1.0000 | ORAL_TABLET | ORAL | 0 refills | Status: DC | PRN

## 2019-08-23 MED ORDER — ONDANSETRON HCL 4 MG/2ML IJ SOLN: 4.0000 mg | Freq: Once | INTRAMUSCULAR | Status: DC | PRN

## 2019-08-23 MED ORDER — FENTANYL CITRATE (PF) 100 MCG/2ML IJ SOLN
INTRAMUSCULAR | Status: DC | PRN
  Administered 2019-08-23 (×2): 50 ug via INTRAVENOUS

## 2019-08-23 MED ORDER — ACETAMINOPHEN 325 MG PO TABS
325.0000 mg | ORAL_TABLET | Freq: Four times a day (QID) | ORAL | Status: DC | PRN
  Administered 2019-08-24 (×2): 650 mg via ORAL
  Filled 2019-08-23 (×2): qty 2

## 2019-08-23 MED ORDER — METHOCARBAMOL 500 MG PO TABS
500.0000 mg | ORAL_TABLET | Freq: Four times a day (QID) | ORAL | Status: DC | PRN
  Administered 2019-08-24 – 2019-08-25 (×3): 500 mg via ORAL
  Filled 2019-08-23 (×3): qty 1

## 2019-08-23 MED ORDER — CEFAZOLIN SODIUM-DEXTROSE 2-4 GM/100ML-% IV SOLN
2.0000 g | Freq: Four times a day (QID) | INTRAVENOUS | Status: AC
  Administered 2019-08-23 (×2): 2 g via INTRAVENOUS
  Filled 2019-08-23 (×2): qty 100

## 2019-08-23 MED ORDER — LACTATED RINGERS IV SOLN: INTRAVENOUS | Status: DC

## 2019-08-23 MED ORDER — ONDANSETRON HCL 4 MG/2ML IJ SOLN: 4.0000 mg | Freq: Four times a day (QID) | INTRAMUSCULAR | Status: DC | PRN

## 2019-08-23 MED ORDER — MENTHOL 3 MG MT LOZG: 1.0000 | LOZENGE | OROMUCOSAL | Status: DC | PRN

## 2019-08-23 MED ORDER — DICLOFENAC SODIUM 1 % TD GEL
2.0000 g | Freq: Three times a day (TID) | TRANSDERMAL | Status: DC | PRN
  Filled 2019-08-23: qty 100

## 2019-08-23 MED ORDER — WATER FOR IRRIGATION, STERILE IR SOLN
Status: DC | PRN
  Administered 2019-08-23: 2000 mL
  Administered 2019-08-23: 1000 mL

## 2019-08-23 MED ORDER — ROPIVACAINE HCL 5 MG/ML IJ SOLN
INTRAMUSCULAR | Status: DC | PRN
  Administered 2019-08-23: 20 mL via PERINEURAL

## 2019-08-23 MED ORDER — HYDROMORPHONE HCL 1 MG/ML IJ SOLN
INTRAMUSCULAR | Status: AC
  Filled 2019-08-23: qty 1

## 2019-08-23 MED ORDER — PANTOPRAZOLE SODIUM 40 MG PO TBEC
40.0000 mg | DELAYED_RELEASE_TABLET | Freq: Every day | ORAL | Status: DC
  Administered 2019-08-24 – 2019-08-25 (×2): 40 mg via ORAL
  Filled 2019-08-23 (×2): qty 1

## 2019-08-23 MED ORDER — POLYETHYLENE GLYCOL 3350 17 G PO PACK: 17.0000 g | PACK | Freq: Every day | ORAL | Status: DC | PRN

## 2019-08-23 MED ORDER — PROPOFOL 10 MG/ML IV BOLUS
INTRAVENOUS | Status: AC
  Filled 2019-08-23: qty 20

## 2019-08-23 MED ORDER — HYDROMORPHONE HCL 1 MG/ML IJ SOLN
INTRAMUSCULAR | Status: DC | PRN
  Administered 2019-08-23 (×2): .5 mg via INTRAVENOUS
  Administered 2019-08-23: 1 mg via INTRAVENOUS

## 2019-08-23 MED ORDER — METHOCARBAMOL 500 MG IVPB - SIMPLE MED
500.0000 mg | Freq: Four times a day (QID) | INTRAVENOUS | Status: DC | PRN
  Filled 2019-08-23: qty 50

## 2019-08-23 MED ORDER — METOCLOPRAMIDE HCL 5 MG/ML IJ SOLN: 5.0000 mg | Freq: Three times a day (TID) | INTRAMUSCULAR | Status: DC | PRN

## 2019-08-23 MED ORDER — LIDOCAINE 2% (20 MG/ML) 5 ML SYRINGE
INTRAMUSCULAR | Status: AC
  Filled 2019-08-23: qty 5

## 2019-08-23 MED ORDER — ONDANSETRON HCL 4 MG PO TABS: 4.0000 mg | ORAL_TABLET | Freq: Four times a day (QID) | ORAL | Status: DC | PRN

## 2019-08-23 MED ORDER — DOCUSATE SODIUM 100 MG PO CAPS
100.0000 mg | ORAL_CAPSULE | Freq: Two times a day (BID) | ORAL | Status: DC
  Administered 2019-08-23 – 2019-08-25 (×4): 100 mg via ORAL
  Filled 2019-08-23 (×4): qty 1

## 2019-08-23 MED ORDER — TRANEXAMIC ACID-NACL 1000-0.7 MG/100ML-% IV SOLN
1000.0000 mg | INTRAVENOUS | Status: AC
  Administered 2019-08-23: 08:00:00 1000 mg via INTRAVENOUS
  Filled 2019-08-23: qty 100

## 2019-08-23 MED ORDER — TRANEXAMIC ACID-NACL 1000-0.7 MG/100ML-% IV SOLN
1000.0000 mg | Freq: Once | INTRAVENOUS | Status: AC
  Administered 2019-08-23: 1000 mg via INTRAVENOUS
  Filled 2019-08-23: qty 100

## 2019-08-23 MED ORDER — CEFAZOLIN SODIUM-DEXTROSE 2-4 GM/100ML-% IV SOLN
2.0000 g | INTRAVENOUS | Status: AC
  Administered 2019-08-23: 08:00:00 2 g via INTRAVENOUS
  Filled 2019-08-23: qty 100

## 2019-08-23 MED ORDER — PROPOFOL 10 MG/ML IV BOLUS
INTRAVENOUS | Status: DC | PRN
  Administered 2019-08-23: 200 mg via INTRAVENOUS

## 2019-08-23 MED ORDER — OXYCODONE HCL 5 MG PO TABS
5.0000 mg | ORAL_TABLET | ORAL | Status: DC | PRN
  Administered 2019-08-23 – 2019-08-24 (×5): 5 mg via ORAL
  Administered 2019-08-24: 10 mg via ORAL
  Administered 2019-08-25 (×2): 5 mg via ORAL
  Filled 2019-08-23: qty 2
  Filled 2019-08-23 (×7): qty 1

## 2019-08-23 MED ORDER — ONDANSETRON HCL 4 MG/2ML IJ SOLN
INTRAMUSCULAR | Status: AC
  Filled 2019-08-23: qty 2

## 2019-08-23 MED ORDER — KETAMINE HCL 10 MG/ML IJ SOLN
INTRAMUSCULAR | Status: DC | PRN
  Administered 2019-08-23: 30 mg via INTRAVENOUS

## 2019-08-23 MED ORDER — SUCCINYLCHOLINE CHLORIDE 200 MG/10ML IV SOSY
PREFILLED_SYRINGE | INTRAVENOUS | Status: AC
  Filled 2019-08-23: qty 10

## 2019-08-23 MED ORDER — DEXAMETHASONE SODIUM PHOSPHATE 10 MG/ML IJ SOLN
INTRAMUSCULAR | Status: AC
  Filled 2019-08-23: qty 1

## 2019-08-23 MED ORDER — METOCLOPRAMIDE HCL 5 MG PO TABS: 5.0000 mg | ORAL_TABLET | Freq: Three times a day (TID) | ORAL | Status: DC | PRN

## 2019-08-23 MED ORDER — WARFARIN SODIUM 5 MG PO TABS
10.0000 mg | ORAL_TABLET | Freq: Once | ORAL | Status: AC
  Administered 2019-08-23: 18:00:00 10 mg via ORAL
  Filled 2019-08-23: qty 2

## 2019-08-23 MED ORDER — AMLODIPINE BESYLATE 5 MG PO TABS
5.0000 mg | ORAL_TABLET | Freq: Every day | ORAL | Status: DC
  Administered 2019-08-24 – 2019-08-25 (×2): 5 mg via ORAL
  Filled 2019-08-23 (×2): qty 1

## 2019-08-23 MED ORDER — HYDROMORPHONE HCL 1 MG/ML IJ SOLN: 0.5000 mg | INTRAMUSCULAR | Status: DC | PRN

## 2019-08-23 SURGICAL SUPPLY — 54 items
BAG ZIPLOCK 12X15 (MISCELLANEOUS) IMPLANT
BLADE SAG 18X100X1.27 (BLADE) ×3 IMPLANT
BLADE SAW SGTL 13X75X1.27 (BLADE) ×3 IMPLANT
BNDG ELASTIC 6X10 VLCR STRL LF (GAUZE/BANDAGES/DRESSINGS) ×3 IMPLANT
BNDG GAUZE ELAST 4 BULKY (GAUZE/BANDAGES/DRESSINGS) ×3 IMPLANT
BOWL SMART MIX CTS (DISPOSABLE) ×3 IMPLANT
CEMENT HV SMART SET (Cement) ×6 IMPLANT
CEMENT TIBIA MBT (Knees) ×1 IMPLANT
CLOSURE WOUND 1/2 X4 (GAUZE/BANDAGES/DRESSINGS) ×2
COVER SURGICAL LIGHT HANDLE (MISCELLANEOUS) ×3 IMPLANT
COVER WAND RF STERILE (DRAPES) IMPLANT
CUFF TOURN SGL QUICK 34 (TOURNIQUET CUFF) ×2
CUFF TRNQT CYL 34X4.125X (TOURNIQUET CUFF) ×1 IMPLANT
DRAPE SHEET LG 3/4 BI-LAMINATE (DRAPES) ×3 IMPLANT
DRAPE U-SHAPE 47X51 STRL (DRAPES) ×3 IMPLANT
DRSG ADAPTIC 3X8 NADH LF (GAUZE/BANDAGES/DRESSINGS) ×3 IMPLANT
DRSG PAD ABDOMINAL 8X10 ST (GAUZE/BANDAGES/DRESSINGS) ×3 IMPLANT
DURAPREP 26ML APPLICATOR (WOUND CARE) ×3 IMPLANT
ELECT REM PT RETURN 15FT ADLT (MISCELLANEOUS) ×3 IMPLANT
FEMUR SIGMA PS SZ 3.0 R (Femur) ×3 IMPLANT
GAUZE SPONGE 4X4 12PLY STRL (GAUZE/BANDAGES/DRESSINGS) ×3 IMPLANT
GLOVE BIOGEL PI ORTHO PRO 7.5 (GLOVE) ×2
GLOVE BIOGEL PI ORTHO PRO SZ8 (GLOVE) ×2
GLOVE ORTHO TXT STRL SZ7.5 (GLOVE) ×3 IMPLANT
GLOVE PI ORTHO PRO STRL 7.5 (GLOVE) ×1 IMPLANT
GLOVE PI ORTHO PRO STRL SZ8 (GLOVE) ×1 IMPLANT
GLOVE SURG ORTHO 8.5 STRL (GLOVE) ×6 IMPLANT
GOWN STRL REUS W/TWL XL LVL3 (GOWN DISPOSABLE) ×6 IMPLANT
HANDPIECE INTERPULSE COAX TIP (DISPOSABLE) ×2
HOLDER FOLEY CATH W/STRAP (MISCELLANEOUS) IMPLANT
IMMOBILIZER KNEE 20 (SOFTGOODS) ×3
IMMOBILIZER KNEE 20 THIGH 36 (SOFTGOODS) ×1 IMPLANT
KIT TURNOVER KIT A (KITS) IMPLANT
MANIFOLD NEPTUNE II (INSTRUMENTS) ×3 IMPLANT
NS IRRIG 1000ML POUR BTL (IV SOLUTION) ×3 IMPLANT
PACK TOTAL KNEE CUSTOM (KITS) ×3 IMPLANT
PATELLA DOME PFC 35MM (Knees) ×3 IMPLANT
PENCIL SMOKE EVACUATOR (MISCELLANEOUS) IMPLANT
PIN STEINMAN FIXATION KNEE (PIN) ×3 IMPLANT
PLATE ROT INSERT 12.5MM SIZE 3 (Plate) ×3 IMPLANT
PROTECTOR NERVE ULNAR (MISCELLANEOUS) ×3 IMPLANT
SET HNDPC FAN SPRY TIP SCT (DISPOSABLE) ×1 IMPLANT
STAPLER VISISTAT 35W (STAPLE) IMPLANT
STRIP CLOSURE SKIN 1/2X4 (GAUZE/BANDAGES/DRESSINGS) ×4 IMPLANT
SUT MNCRL AB 3-0 PS2 18 (SUTURE) ×3 IMPLANT
SUT VIC AB 0 CT1 36 (SUTURE) ×3 IMPLANT
SUT VIC AB 1 CT1 36 (SUTURE) ×9 IMPLANT
SUT VIC AB 2-0 CT1 27 (SUTURE) ×2
SUT VIC AB 2-0 CT1 TAPERPNT 27 (SUTURE) ×1 IMPLANT
TIBIA MBT CEMENT (Knees) ×3 IMPLANT
TRAY FOLEY MTR SLVR 16FR STAT (SET/KITS/TRAYS/PACK) IMPLANT
WATER STERILE IRR 1000ML POUR (IV SOLUTION) ×6 IMPLANT
WRAP KNEE MAXI GEL POST OP (GAUZE/BANDAGES/DRESSINGS) ×3 IMPLANT
YANKAUER SUCT BULB TIP 10FT TU (MISCELLANEOUS) ×3 IMPLANT

## 2019-08-23 NOTE — Interval H&P Note (Signed)
History and Physical Interval Note:  08/23/2019 7:39 AM  Christina [name]  has presented today for surgery, with the diagnosis of Right knee endstage osteoarthritis.  The various methods of treatment have been discussed with the patient and family. After consideration of risks, benefits and other options for treatment, the patient has consented to  Procedure(s): TOTAL KNEE ARTHROPLASTY (Right) as a surgical intervention.  The patient's history has been reviewed, patient examined, no change in status, stable for surgery.  I have reviewed the patient's chart and labs.  Questions were answered to the patient's satisfaction.     Verlee Rossetti

## 2019-08-23 NOTE — Progress Notes (Signed)
ANTICOAGULATION CONSULT NOTE - Initial Consult  Pharmacy Consult for warfarin Indication: History of DVT/PE  Allergies  Allergen Reactions  . Ace Inhibitors Cough         Patient Measurements: Height: 5\' 8"  (172.7 cm) Weight: 189 lb (85.7 kg) IBW/kg (Calculated) : 63.9  Vital Signs: Temp: 97.8 F (36.6 C) (01/08 1117) Temp Source: Oral (01/08 1117) BP: 136/78 (01/08 1117) Pulse Rate: 87 (01/08 1117)  Labs: Recent Labs    08/23/19 0602  LABPROT 14.6  INR 1.1    Estimated Creatinine Clearance: 60 mL/min (by C-G formula based on SCr of 1 mg/dL).   Medical History: Past Medical History:  Diagnosis Date  . Allergic rhinitis   . Clotting disorder (HCC)   . DVT (deep venous thrombosis) (HCC) 1990's  . DVT (deep venous thrombosis) (HCC) 1980s   in the leg behind my knee;  right   . Epicondylitis    right   . GERD (gastroesophageal reflux disease)   . Hyperlipidemia   . Hypertension   . Osteoarthritis   . Pulmonary embolism (HCC) 1990's    Medications:  Medications Prior to Admission  Medication Sig Dispense Refill Last Dose  . amLODipine (NORVASC) 5 MG tablet TAKE 1 TABLET BY MOUTH EVERY DAY (Patient taking differently: Take 5 mg by mouth daily. ) 90 tablet 04 08/23/2019 at 0415  . diclofenac sodium (VOLTAREN) 1 % GEL Apply 2 g topically 4 (four) times daily. (Patient taking differently: Apply 2 g topically 3 (three) times daily as needed (knee pain). ) 350 g 0 Past Month at Unknown time  . olmesartan (BENICAR) 40 MG tablet TAKE 1 TABLET BY MOUTH EVERY DAY 30 tablet 0 08/23/2019 at 0415  . pantoprazole (PROTONIX) 40 MG tablet Take 1 tablet (40 mg total) by mouth daily. 30 tablet 3 08/23/2019 at 0415  . warfarin (COUMADIN) 10 MG tablet TAKE 1 TABLET BY MOUTH EVERY DAY (Patient taking differently: Take 5-10 mg by mouth See admin instructions. Take 5 mg by mouth on Monday, Tuesday, Wednesday, Thursday, Saturday and Sunday; on Friday take 10 mg) 90 tablet 1 08/17/2019  .  colchicine 0.6 MG tablet TAKE 2 AT PAIN ONSET. MAY REPEAT 1 TABLET IN 1 HOUR IF STILL HURTING. (Patient not taking: Reported on 04/15/2019) 30 tablet 2   . furosemide (LASIX) 20 MG tablet Take 1 tablet (20 mg total) by mouth daily. (Patient not taking: Reported on 08/13/2019) 90 tablet 1 Not Taking at Unknown time  . omeprazole (PRILOSEC) 40 MG capsule TAKE 1 CAPSULE BY MOUTH EVERY DAY (Patient not taking: Reported on 08/13/2019) 90 capsule 1 Not Taking at Unknown time    Assessment: Total knee arthroplasty performed today.  Pharmacy consulted to dose warfarin which is being resumed post-op.  Warfarin regimen per anticoagulation clinic is 5mg  po qday except 10mg  on Fridays.  Goal of Therapy:  INR 2-3 Monitor platelets by anticoagulation protocol: Yes   Plan:  Warfarin 10 mg po x 1 today.  Monitor INR daily for warfarin dose.   , PharmD, BCPS 08/23/2019,12:16 PM

## 2019-08-23 NOTE — Progress Notes (Signed)
Orthopedic Tech Progress Note Patient Details:  Christina [name] 10/16/1950 [id number]  CPM Right Knee CPM Right Knee: On Right Knee Flexion (Degrees): 90 Right Knee Extension (Degrees): 0  Post Interventions Patient Tolerated: Well Instructions Provided: Care of device Ortho Devices Type of Ortho Device: Bone foam zero knee, CPM padding Ortho Device/Splint Location: RLE Ortho Device/Splint Interventions: Ordered   Post Interventions Patient Tolerated: Well Instructions Provided: Care of device   Ancil Linsey 08/23/2019, 11:10 AM

## 2019-08-23 NOTE — Op Note (Signed)
Christina [name], Christina [name] MEDICAL RECORD KX:[id number] ACCOUNT [id number] DATE OF BIRTH:23-May-1951 FACILITY: [facility] LOCATION: [facility] PHYSICIAN:STEVEN Orlena Sheldon, MD  OPERATIVE REPORT  DATE OF PROCEDURE:  08/23/2019  PREOPERATIVE DIAGNOSIS:  Right knee end-stage arthritis.  POSTOPERATIVE DIAGNOSIS:  Right knee end-stage arthritis.  PROCEDURE PERFORMED:  Right total knee arthroplasty using DePuy Sigma rotating platform prosthesis.  ATTENDING SURGEON: Esmond Plants MD  ASSISTANT:  Darol Destine, [place], who was scrubbed during the entire procedure and necessary for satisfactory completion of surgery.  ANESTHESIA:  General anesthesia was used plus adductor canal block.  ESTIMATED BLOOD LOSS:  Minimal.  FLUID REPLACEMENT:  1500 mL crystalloid.  INSTRUMENT COUNTS:  Correct.  COMPLICATIONS:  No complications.  ANTIBIOTICS:  Perioperative antibiotics were given.  TOURNIQUET TIME:  One hour at 300 mmHg.  INDICATIONS:  The patient is a 69 year old female with worsening valgus deformity with her right knee.  She has had progressive bone-on-bone arthritis to the point where she is having night pain and rest pain.  The patient has limited ability to ambulate  due to pain and despite conservative management, presents now for operative treatment to restore function and eliminate pain.  Informed consent obtained.  DESCRIPTION OF PROCEDURE:  After an adequate level of anesthesia was achieved, the patient was positioned in the supine position, right leg correctly identified.  Nonsterile tourniquet placed on proximal thigh.  Right leg sterilely prepped and draped in  the usual manner.  Time-out called, verifying correct patient and correct site.  We elevated the right leg, exsanguinating with an Esmarch bandage and elevating the tourniquet to 300 mmHg.  We placed the knee in flexion, performed a longitudinal midline  incision with a 10 blade scalpel.  Dissection down through subcutaneous tissues.  We  identified the peripatellar tissues and performed a median parapatellar arthrotomy with a fresh 10 blade scalpel.  We divided lateral patellofemoral ligaments, everted  the patella and exposed the knee.  Bone-on-bone arthritis was noted.  We entered the distal femur with a step-cut drill.  We placed our intramedullary resection guide and resected at 3 degrees of valgus for this valgus knee and 10 mm of resection.  The  patient had a slight 1 or 2 degree flexion contracture.  Once we did our distal cut with the oscillating saw, we sized the knee to a size 3 anterior down with the sizing block and then performed our 4 cuts, anterior, posterior and chamfer cuts with a  4-in-1 block with careful attention towards appropriate rotation.  Once we had those cuts performed, we removed ACL, PCL, and meniscal tissues.  We subluxed the tibia anteriorly.  We then went ahead and used an external alignment guide for the tibia and  performed a 90-degree perpendicular cut to the long axis of the tibia with minimal posterior slope for this posterior cruciate substituting prosthesis.  We performed our cut with the oscillating saw.  We then checked our gaps, which were symmetric at 10  mm.  We used a lamina spreader and removed excess osteophytes off the posterior femur.  We then went ahead and completed our tibial preparation with the modular drill and keel punch for the size 3 tibia and then we performed our box cut for the femur and  placed a 3 right femur.  We started with a 10 spacer and were happy with our soft tissue balancing and ability to come to full extension.  We then resurfaced the patella using the patellar cutting guide and measured 24 mm thickness precut  and then cut  down to 15 mm thickness.  We drilled lug holes for the 35 patellar button.  We placed the patellar button trial in place and ranged the knee and had excellent tracking with no-touch technique.  We removed all trial components, pulse irrigated,  dried the  bone well and then vacuum mixed cement on the back table with DePuy high viscosity cement.  We cemented the tibia, femur and patella all together, placed a 10 mm spacer on the tibia and then reduced the knee and held it in extension during the curing  process and had a patellar clamp on the patella.  Once the cement was hard, we removed excess cement with quarter-inch curved osteotome.  We re-examined the knee and felt that we could probably get a 12.5 in, still very stable with the 10, but did not  had that pop during reducing the medial aspect of the tibial tray under the femoral condyle.  We removed the trial size 10, 3 tibia, poly spacer and then went ahead and selected a 12.5.  After pulse irrigating and checking for any additional cement  fragments, we placed the 3, 12 poly on the tibial tray and then reduced the knee and we had excellent soft tissue balancing with having a little pop as we reduced that tibia tray underneath the femur and had full extension and no hyperextension at all  and very stable in flexion.  So we irrigated again and then repaired the median parapatellar arthrotomy with #1 Vicryl suture, followed by 0 and 2-0 layered subcutaneous closure and 4-0 Monocryl for skin.  Steri-Strips applied, followed by sterile  dressing.  The patient tolerated surgery well.  VN/NUANCE  D:08/23/2019 T:08/23/2019 JOB:[id number]

## 2019-08-23 NOTE — Brief Op Note (Signed)
08/23/2019  9:49 AM  PATIENT:  Christina [name]  69 y.o. female  PRE-OPERATIVE DIAGNOSIS:  Right knee endstage osteoarthritis  POST-OPERATIVE DIAGNOSIS:  Right knee endstage osteoarthritis  PROCEDURE:  Procedure(s): TOTAL KNEE ARTHROPLASTY (Right) DePuy Sigma RP  SURGEON:  Surgeon(s) and Role:    Beverely Low, MD - Primary  PHYSICIAN ASSISTANT:   ASSISTANTS: Thea Gist, PA-C   ANESTHESIA:   regional and general  EBL:  50 mL   BLOOD ADMINISTERED:none  DRAINS: none   LOCAL MEDICATIONS USED:  NONE  SPECIMEN:  No Specimen  DISPOSITION OF SPECIMEN:  N/A  COUNTS:  YES  TOURNIQUET:   Total Tourniquet Time Documented: Thigh (Right) - 94 minutes Total: Thigh (Right) - 94 minutes   DICTATION: .Other Dictation: Dictation Number [id number]  PLAN OF CARE: Admit to inpatient   PATIENT DISPOSITION:  PACU - hemodynamically stable.   Delay start of Pharmacological VTE agent (>24hrs) due to surgical blood loss or risk of bleeding: no

## 2019-08-23 NOTE — Anesthesia Postprocedure Evaluation (Signed)
Anesthesia Post Note  Patient: Chief Executive Officer  Procedure(s) Performed: TOTAL KNEE ARTHROPLASTY (Right Knee)     Patient location during evaluation: PACU Anesthesia Type: General Level of consciousness: awake and alert Pain management: pain level controlled Vital Signs Assessment: post-procedure vital signs reviewed and stable Respiratory status: spontaneous breathing, nonlabored ventilation, respiratory function stable and patient connected to nasal cannula oxygen Cardiovascular status: blood pressure returned to baseline and stable Postop Assessment: no apparent nausea or vomiting Anesthetic complications: no    Last Vitals:  Vitals:   08/23/19 1100 08/23/19 1117  BP: (!) 144/81 136/78  Pulse: 85 87  Resp: 10 14  Temp: 36.6 C 36.6 C  SpO2: 100% 100%    Last Pain:  Vitals:   08/23/19 1117  TempSrc: Oral  PainSc:                  Kennieth Rad

## 2019-08-23 NOTE — Evaluation (Signed)
Physical Therapy Evaluation Patient Details Name: Christina [name] MRN: [id number] DOB: [birth year] Today's Date: 08/23/2019   History of Present Illness  Patient is 69 y.o. female s/p Rt TKA on 08/23/19 with PMH significant for HTN, HLD, OA, GERD, and DVT.  Clinical Impression  Shakiyla Kook is a 69 y.o. female POD 0 s/p Rt TKA. Patient reports independence with mobility at baseline. Patient is now limited by functional impairments (see PT problem list below) and requires min assist for transfers and gait with RW. Patient was able to ambulate ~2x10 feet with RW to use bathroom and required min assist and manual blocking of Rt knee to prevent buckling. Patient instructed in exercise to facilitate ROM and circulation. Patient will benefit from continued skilled PT interventions to address impairments and progress towards PLOF. Acute PT will follow to progress mobility and stair training in preparation for safe discharge home.     Follow Up Recommendations Follow surgeon's recommendation for DC plan and follow-up therapies    Equipment Recommendations  Rolling walker with 5" wheels;3in1 (PT)    Recommendations for Other Services       Precautions / Restrictions Precautions Precautions: Fall Restrictions Weight Bearing Restrictions: No      Mobility  Bed Mobility Overal bed mobility: Needs Assistance Bed Mobility: Supine to Sit     Supine to sit: HOB elevated;Supervision     General bed mobility comments: pt using bed rails, no assist for mobility, pt taking extra time  Transfers Overall transfer level: Needs assistance Equipment used: Rolling walker (2 wheeled)             General transfer comment: Min assist from EOB and toilet with RW, verbal cues for hand placement and technique with RW  Ambulation/Gait   Gait Distance (Feet): 20 Feet(2x10) Assistive device: Rolling walker (2 wheeled) Gait Pattern/deviations: Step-to pattern;Decreased stride length;Decreased stance time  - right;Decreased step length - left Gait velocity: decreased   General Gait Details: pt with silght buckling of Rt Knee during stance phase and required manual facilitation/blocking by therapist to prevent buckling. cues for step pattern and to unweight Rt LE with UE support provided.  Stairs            Wheelchair Mobility    Modified Rankin (Stroke Patients Only)       Balance Overall balance assessment: Needs assistance Sitting-balance support: Feet supported Sitting balance-Leahy Scale: Good     Standing balance support: During functional activity;Bilateral upper extremity supported Standing balance-Leahy Scale: Poor              Pertinent Vitals/Pain Pain Assessment: No/denies pain    Home Living Family/patient expects to be discharged to:: Private residence Living Arrangements: Spouse/significant other Available Help at Discharge: Family;Available 24 hours/day Type of Home: House Home Access: Stairs to enter Entrance Stairs-Rails: None Entrance Stairs-Number of Steps: 1 Home Layout: One level(pt has single step down to the den and her bedroom) Home Equipment: Kasandra Knudsen - single point      Prior Function Level of Independence: Independent         Comments: 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[place], [place]: Right    Extremity/Trunk Assessment   Upper Extremity Assessment Upper Extremity Assessment: Overall WFL for tasks assessed    Lower Extremity Assessment Lower Extremity Assessment: RLE deficits/detail RLE Deficits / Details: pt with limited quad activation 3-/5 for quad strength with MMT; pt with some buckling in  WB RLE Sensation: WNL RLE Coordination: WNL    Cervical / Trunk Assessment Cervical / Trunk Assessment: Normal  Communication   Communication: No difficulties  Cognition Arousal/Alertness: Awake/alert Behavior During Therapy: WFL for tasks assessed/performed Overall Cognitive  Status: Within Functional Limits for tasks assessed           General Comments      Exercises Total Joint Exercises Ankle Circles/Pumps: AROM;10 reps;Seated;Both Quad Sets: AROM;5 reps;Seated;Right   Assessment/Plan    PT Assessment Patient needs continued PT services  PT Problem List Decreased strength;Decreased balance;Decreased mobility;Decreased range of motion;Decreased activity tolerance;Decreased knowledge of use of DME       PT Treatment Interventions DME instruction;Functional mobility training;Balance training;Patient/family education;Therapeutic activities;Gait training;Stair training;Therapeutic exercise    PT Goals (Current goals can be found in the Care Plan section)  Acute Rehab PT Goals Patient 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 PT Goal Formulation: With patient Time For Goal Achievement: 08/30/19 Potential to Achieve Goals: Good    Frequency 7X/week    AM-PAC PT "6 Clicks" Mobility  Outcome Measure Help needed turning from your back to your side while in a flat bed without using bedrails?: A Little Help needed moving from lying on your back to sitting on the side of a flat bed without using bedrails?: A Little Help needed moving to and from a bed to a chair (including a wheelchair)?: A Little Help needed standing up from a chair using your arms (e.g., wheelchair or bedside chair)?: A Little Help needed to walk in hospital room?: A Little Help needed climbing 3-5 steps with a railing? : A Lot 6 Click Score: 17    End of Session Equipment Utilized During Treatment: Gait belt Activity Tolerance: Patient tolerated treatment well Patient left: with call bell/phone within reach;in chair;with chair alarm set Nurse Communication: Mobility status PT Visit Diagnosis: Muscle weakness (generalized) (M62.81);Difficulty in walking, not elsewhere classified (R26.2)    Time: 5400-8676 PT Time Calculation (min) (ACUTE ONLY): 30  min   Charges:   PT Evaluation $PT Eval Low Complexity: 1 Low PT Treatments $Therapeutic Activity: 8-22 mins       Wynn Maudlin, DPT Physical Therapist with [facility] [phone]  08/23/2019 7:12 PM

## 2019-08-23 NOTE — Anesthesia Procedure Notes (Signed)
Procedure Name: LMA Insertion Date/Time: 08/23/2019 7:37 AM Performed by: [place], Kalandra Masters C, CRNA Pre-anesthesia Checklist: Patient identified, Emergency Drugs available, Suction available and Patient being monitored Patient Re-evaluated:Patient Re-evaluated prior to induction Oxygen Delivery Method: Circle system utilized Preoxygenation: Pre-oxygenation with 100% oxygen Induction Type: IV induction Ventilation: Mask ventilation without difficulty LMA: LMA inserted LMA Size: 4.0 Number of attempts: 1 Airway Equipment and Method: Bite block Placement Confirmation: positive ETCO2 Tube secured with: Tape Dental Injury: Teeth and Oropharynx as per pre-operative assessment

## 2019-08-23 NOTE — Transfer of Care (Signed)
Immediate Anesthesia Transfer of Care Note  Patient: Christina [name]  Procedure(s) Performed: TOTAL KNEE ARTHROPLASTY (Right Knee)  Patient Location: PACU  Anesthesia Type:GA combined with regional for post-op pain  Level of Consciousness: awake and alert   Airway & Oxygen Therapy: Patient Spontanous Breathing and Patient connected to face mask oxygen  Post-op Assessment: Report given to RN and Post -op Vital signs reviewed and stable  Post vital signs: Reviewed and stable  Last Vitals:  Vitals Value Taken Time  BP 136/107 08/23/19 0948  Temp    Pulse 93 08/23/19 0948  Resp 8 08/23/19 0948  SpO2 100 % 08/23/19 0948  Vitals shown include unvalidated device data.  Last Pain:  Vitals:   08/23/19 0600  TempSrc: Oral  PainSc:          Complications: No apparent anesthesia complications

## 2019-08-23 NOTE — Anesthesia Procedure Notes (Signed)
Anesthesia Regional Block: Adductor canal block   Pre-Anesthetic Checklist: ,, timeout performed, Correct Patient, Correct Site, Correct Laterality, Correct Procedure, Correct Position, site marked, Risks and benefits discussed,  Surgical consent,  Pre-op evaluation,  At surgeon's request and post-op pain management  Laterality: Right  Prep: chloraprep       Needles:  Injection technique: Single-shot  Needle Type: Echogenic Needle     Needle Length: 9cm  Needle Gauge: 21     Additional Needles:   Procedures:,,,, ultrasound used (permanent image in chart),,,,  Narrative:  Start time: 08/23/2019 7:10 AM End time: 08/23/2019 7:14 AM Injection made incrementally with aspirations every 5 mL.  Performed by: Personally  Anesthesiologist: Marcene Duos, MD

## 2019-08-24 ENCOUNTER — Encounter (HOSPITAL_COMMUNITY): Payer: Self-pay | Admitting: Orthopedic Surgery

## 2019-08-24 DIAGNOSIS — M1711 Unilateral primary osteoarthritis, right knee: Secondary | ICD-10-CM | POA: Diagnosis not present

## 2019-08-24 LAB — CBC
HCT: 41.4 % (ref 36.0–46.0)
Hemoglobin: 12.7 g/dL (ref 12.0–15.0)
MCH: 25.3 pg — ABNORMAL LOW (ref 26.0–34.0)
MCHC: 30.7 g/dL (ref 30.0–36.0)
MCV: 82.6 fL (ref 80.0–100.0)
Platelets: 234 10*3/uL (ref 150–400)
RBC: 5.01 MIL/uL (ref 3.87–5.11)
RDW: 13.2 % (ref 11.5–15.5)
WBC: 11.5 10*3/uL — ABNORMAL HIGH (ref 4.0–10.5)
nRBC: 0 % (ref 0.0–0.2)

## 2019-08-24 LAB — BASIC METABOLIC PANEL
Anion gap: 11 (ref 5–15)
BUN: 15 mg/dL (ref 8–23)
CO2: 22 mmol/L (ref 22–32)
Calcium: 9.6 mg/dL (ref 8.9–10.3)
Chloride: 107 mmol/L (ref 98–111)
Creatinine, Ser: 1.08 mg/dL — ABNORMAL HIGH (ref 0.44–1.00)
GFR calc Af Amer: >60 mL/min (ref >60)
GFR calc non Af Amer: 52 mL/min — ABNORMAL LOW (ref >60)
Glucose, Bld: 100 mg/dL — ABNORMAL HIGH (ref 70–99)
Potassium: 4.6 mmol/L (ref 3.5–5.1)
Sodium: 140 mmol/L (ref 135–145)

## 2019-08-24 LAB — PROTIME-INR
INR: 1.2 (ref 0.8–1.2)
Prothrombin Time: 14.7 seconds (ref 11.4–15.2)

## 2019-08-24 MED ORDER — ASPIRIN 81 MG PO CHEW
81.0000 mg | CHEWABLE_TABLET | Freq: Two times a day (BID) | ORAL | Status: DC
  Administered 2019-08-24 – 2019-08-25 (×3): 81 mg via ORAL
  Filled 2019-08-24 (×3): qty 1

## 2019-08-24 MED ORDER — WARFARIN SODIUM 5 MG PO TABS
7.5000 mg | ORAL_TABLET | Freq: Once | ORAL | Status: AC
  Administered 2019-08-24: 17:00:00 7.5 mg via ORAL
  Filled 2019-08-24: qty 1

## 2019-08-24 MED ORDER — ASPIRIN 81 MG PO CHEW: 81.0000 mg | CHEWABLE_TABLET | Freq: Two times a day (BID) | ORAL | 0 refills | Status: DC

## 2019-08-24 NOTE — Progress Notes (Signed)
Physical Therapy Treatment Patient Details Name: Christina [name] MRN: [id number] DOB: 03-07-1951 Today's Date: 08/24/2019    History of Present Illness Patient is 69 y.o. female s/p Rt TKA on 08/23/19 with PMH significant for HTN, HLD, OA, GERD, and DVT.    PT Comments    POD # 1 am session Pt reports a poor night of sleep and issues with pain control.  Assisted OOB to [facility]. General bed mobility comments: demonstarted and instructed pt how to use belt loop to self assist LE off bed.  General transfer comment: Min assist from EOB and toilet with RW, verbal cues for hand placement and technique with RW.  General Gait Details: slight increase in distance and 50% VC's on proper sequencing and proper walker to self distance. Then returned to room to perform some TE's following HEP handout.  Instructed on proper tech, freq as well as use of ICE.     Follow Up Recommendations  Follow surgeon's recommendation for DC plan and follow-up therapies     Equipment Recommendations  Rolling walker with 5" wheels;3in1 (PT)    Recommendations for Other Services       Precautions / Restrictions Precautions Precautions: Fall Precaution Comments: instructed no pillow under knee Restrictions Weight Bearing Restrictions: No Other Position/Activity Restrictions: WBAT    Mobility  Bed Mobility Overal bed mobility: Needs Assistance Bed Mobility: Supine to Sit           General bed mobility comments: demonstarted and instructed pt how to use belt loop to self assist LE off bed  Transfers Overall transfer level: Needs assistance Equipment used: Rolling walker (2 wheeled) Transfers: Sit to/from [employer] Sit to Stand: Min assist Stand pivot transfers: Min assist       General transfer comment: Min assist from EOB and toilet with RW, verbal cues for hand placement and technique with RW  Ambulation/Gait Ambulation/Gait assistance: Min assist;Min guard Gait Distance (Feet): 22  Feet Assistive device: Rolling walker (2 wheeled) Gait Pattern/deviations: Step-to pattern;Decreased stride length;Decreased stance time - right;Decreased step length - left Gait velocity: decreased   General Gait Details: slight increase in distance and 50% VC's on proper sequencing and proper walker to self distance   Stairs             Wheelchair Mobility    Modified Rankin (Stroke Patients Only)       Balance                                            Cognition Arousal/Alertness: Awake/alert Behavior During Therapy: WFL for tasks assessed/performed Overall Cognitive Status: Within Functional Limits for tasks assessed                                        Exercises   Total Knee Replacement TE's 10 reps B LE ankle pumps 10 reps towel squeezes 10 reps knee presses 05 reps heel slides  05 reps SLR's 05 reps ABD Followed by ICE    General Comments        Pertinent Vitals/Pain Pain Assessment: 0-10 Pain Score: 6  Pain Location: R knee Pain Descriptors / Indicators: Discomfort;Tender;Operative site guarding Pain Intervention(s): Monitored during session;Premedicated before session;Repositioned;Ice applied    Home Living  Prior Function            PT Goals (current goals can now be found in the care plan section) Progress towards PT goals: Progressing toward goals    Frequency    7X/week      PT Plan Current plan remains appropriate    Co-evaluation              AM-PAC PT "6 Clicks" Mobility   Outcome Measure  Help needed turning from your back to your side while in a flat bed without using bedrails?: A Little Help needed moving from lying on your back to sitting on the side of a flat bed without using bedrails?: A Little Help needed moving to and from a bed to a chair (including a wheelchair)?: A Little Help needed standing up from a chair using your arms (e.g.,  wheelchair or bedside chair)?: A Little Help needed to walk in hospital room?: A Little Help needed climbing 3-5 steps with a railing? : A Lot 6 Click Score: 17    End of Session Equipment Utilized During Treatment: Gait belt Activity Tolerance: Patient limited by fatigue;Patient limited by pain Patient left: with call bell/phone within reach;in chair;with chair alarm set Nurse Communication: Mobility status PT Visit Diagnosis: Muscle weakness (generalized) (M62.81);Difficulty in walking, not elsewhere classified (R26.2)     Time: 7903-8333 PT Time Calculation (min) (ACUTE ONLY): 25 min  Charges:  $Gait Training: 8-22 mins $Therapeutic Exercise: 8-22 mins                     Rica Koyanagi  PTA Acute  Rehabilitation Services Pager      [phone] Office      [phone]

## 2019-08-24 NOTE — Plan of Care (Signed)
  Problem: Activity: Goal: Risk for activity intolerance will decrease Outcome: Progressing   Problem: Pain Managment: Goal: General experience of comfort will improve Outcome: Progressing   Problem: Safety: Goal: Ability to remain free from injury will improve Outcome: Progressing   Problem: Activity: Goal: Range of joint motion will improve Outcome: Progressing   Problem: Skin Integrity: Goal: Will show signs of wound healing Outcome: Progressing

## 2019-08-24 NOTE — Progress Notes (Signed)
Orthopedics Progress Note  Subjective: Patient doing well this morning after her knee surgery complaining of knee pain but tolerable  Objective:  Vitals:   08/24/19 0131 08/24/19 0515  BP: (!) 158/81 128/90  Pulse: 98 91  Resp: 16 14  Temp: 97.9 F (36.6 C) 98.7 F (37.1 C)  SpO2: 100% 99%    General: Awake and alert  Musculoskeletal: Right knee incision looks good. Bandage changed to Aquacel. No pain with calf pumps Good heel slide and quad set Neurovascularly intact  Lab Results  Component Value Date   WBC 11.5 (H) 08/24/2019   HGB 12.7 08/24/2019   HCT 41.4 08/24/2019   MCV 82.6 08/24/2019   PLT 234 08/24/2019       Component Value Date/Time   NA 140 08/24/2019 0432   NA 145 (H) 08/06/2018 1020   K 4.6 08/24/2019 0432   CL 107 08/24/2019 0432   CO2 22 08/24/2019 0432   GLUCOSE 100 (H) 08/24/2019 0432   BUN 15 08/24/2019 0432   BUN 14 08/06/2018 1020   CREATININE 1.08 (H) 08/24/2019 0432   CREATININE 1.09 02/01/2013 1606   CALCIUM 9.6 08/24/2019 0432   GFRNONAA 52 (L) 08/24/2019 0432   GFRNONAA 54 (L) 02/01/2013 1606   GFRAA >60 08/24/2019 0432   GFRAA 62 02/01/2013 1606    Lab Results  Component Value Date   INR 1.2 08/24/2019   INR 1.1 08/23/2019   INR 3.2 (H) 08/14/2019    Assessment/Plan: POD# 1 s/p Procedure(s): TOTAL KNEE ARTHROPLASTY Discharge to home after therapy today. Home Health PT,OT Recommend aggressive DVT prophylaxis - coumadin, dosing per Pharmacy 81mg  ASA BID until therapeutic Follow up in two weeks  . Almedia Balls, MD 08/24/2019 9:01 AM

## 2019-08-24 NOTE — Discharge Instructions (Signed)
Ice to the knee constantly constantly.  Keep the incision covered and clean and dry for one week, then ok to get it wet in the shower. DO NOT prop anything under the knee, it will make your knee stiff. OK to bear full weight on the right leg  Do exercise as instructed every hour while awake.   Please use your walker for balance and take your time getting up.   Wear your support stockings on both legs for one month following surgery to prevent blood clots.  Take baby aspirin twice daily until your INR is therapeutic (between 2.5-3.0) meaning that your blood is appropriately thinned, then stop the baby aspirin, hopefully by Monday  Follow up with Dr Ranell Patrick in two weeks in the office, call [phone] for appt and call for any questions

## 2019-08-24 NOTE — TOC Progression Note (Signed)
Transition of Care [facility]) - Progression Note    Patient Details  Name: Christina [name] MRN: [id number] Date of Birth: 28-Aug-1950  [facility] CM/SW Contact  Armanda Heritage, RN Phone Number: 08/24/2019, 11:09 AM  Clinical Narrative:    Patient set up with [facility] for HHPT/OT services. [employer] to deliver rolling walker and 3in1 to bedside for home use.   Expected Discharge Plan: Home w Home Health Services Barriers to Discharge: No Barriers Identified  Expected Discharge Plan and Services Expected Discharge Plan: Home w Home Health Services   Discharge Planning Services: CM Consult   Living arrangements for the past 2 months: Single Family Home Expected Discharge Date: 08/24/19               DME Arranged: Cherre Huger rolling DME Agency: [employer] Date DME Agency Contacted: 08/24/19 Time DME Agency Contacted: [phone] Representative spoke with at DME Agency: [place] HH Arranged: PT, OT HH Agency: [facility] ([facility] Date HH Agency Contacted: 08/24/19 Time HH Agency Contacted: [phone] Representative spoke with at [facility] Agency: Laurelyn Sickle   Social Determinants of Health (SDOH) Interventions    Readmission Risk Interventions No flowsheet data found.

## 2019-08-24 NOTE — Progress Notes (Signed)
ANTICOAGULATION CONSULT NOTE  Pharmacy Consult for warfarin Indication: History of DVT/PE, continued s/p R TKA  Allergies  Allergen Reactions  . Ace Inhibitors Cough        Patient Measurements: Height: 5\' 8"  (172.7 cm) Weight: 189 lb (85.7 kg) IBW/kg (Calculated) : 63.9  Vital Signs: Temp: 98.7 F (37.1 C) (01/09 0515) Temp Source: Oral (01/09 0515) BP: 128/90 (01/09 0515) Pulse Rate: 91 (01/09 0515)  Labs: Recent Labs    08/23/19 0602 08/24/19 0432  HGB  --  12.7  HCT  --  41.4  PLT  --  234  LABPROT 14.6 14.7  INR 1.1 1.2  CREATININE  --  1.08*   Estimated Creatinine Clearance: 55.6 mL/min (A) (by C-G formula based on SCr of 1.08 mg/dL (H)).  Medical History: Past Medical History:  Diagnosis Date  . Allergic rhinitis   . Clotting disorder (HCC)   . DVT (deep venous thrombosis) (HCC) 1990's  . DVT (deep venous thrombosis) (HCC) 1980s   in the leg behind my knee;  right   . Epicondylitis    right   . GERD (gastroesophageal reflux disease)   . Hyperlipidemia   . Hypertension   . Osteoarthritis   . Pulmonary embolism (HCC) 1990's   Assessment: Total knee arthroplasty performed today.  Pharmacy consulted to dose warfarin which is being resumed post-op.  Warfarin regimen per anticoagulation clinic is 5mg  po qday except 10mg  on Fridays. Last dose 1/2  Goal of Therapy:  INR 2-3 Monitor platelets by anticoagulation protocol: Yes   Today, 08/24/2019 INR 1.2 after 10mg  Warfarin   Plan:  Warfarin 7.5mg  today at 1800 Daily Protime/INR,  CBC next 1/10  , PharmD 08/24/2019,8:43 AM

## 2019-08-24 NOTE — Progress Notes (Signed)
Physical Therapy Treatment Patient Details Name: Christina [name] MRN: [id number] DOB: 1950-11-01 Today's Date: 08/24/2019    History of Present Illness Patient is 69 y.o. female s/p Rt TKA on 08/23/19 with PMH significant for HTN, HLD, OA, GERD, and DVT.    PT Comments    POD # 1 pm session "I've been sleeping a lot today".  Assisted with amb an increased distance.  Then returned to room to perform some TE's following HEP handout.  Instructed on proper tech, freq as well as use of ICE.   Plans to D/C to home tomorrow due to pain control.   Follow Up Recommendations  Follow surgeon's recommendation for DC plan and follow-up therapies     Equipment Recommendations  Rolling walker with 5" wheels;3in1 (PT)    Recommendations for Other Services       Precautions / Restrictions Precautions Precautions: Fall Precaution Comments: instructed no pillow under knee Restrictions Weight Bearing Restrictions: No Other Position/Activity Restrictions: WBAT    Mobility  Bed Mobility Overal bed mobility: Needs Assistance Bed Mobility: Supine to Sit           General bed mobility comments: OOB in recliner  Transfers Overall transfer level: Needs assistance Equipment used: Rolling walker (2 wheeled) Transfers: Sit to/from [employer] Sit to Stand: Min assist Stand pivot transfers: Min assist       General transfer comment: Min assist from EOB and toilet with RW, verbal cues for hand placement and technique with RW  Ambulation/Gait Ambulation/Gait assistance: Min assist;Min guard Gait Distance (Feet): 34 Feet Assistive device: Rolling walker (2 wheeled) Gait Pattern/deviations: Step-to pattern;Decreased stride length;Decreased stance time - right;Decreased step length - left Gait velocity: decreased   General Gait Details: slight increase in distance and 50% VC's on proper sequencing and proper walker to self distance   Stairs             Wheelchair  Mobility    Modified Rankin (Stroke Patients Only)       Balance                                            Cognition Arousal/Alertness: Awake/alert Behavior During Therapy: WFL for tasks assessed/performed Overall Cognitive Status: Within Functional Limits for tasks assessed                                        Exercises  5 reps LAQ's 10 reps AP 5 reps knee presses 5 reps HS ICE applied    General Comments        Pertinent Vitals/Pain Pain Assessment: 0-10 Pain Score: 6  Pain Location: R knee Pain Descriptors / Indicators: Discomfort;Tender;Operative site guarding Pain Intervention(s): Monitored during session;Premedicated before session;Repositioned;Ice applied    Home Living                      Prior Function            PT Goals (current goals can now be found in the care plan section) Progress towards PT goals: Progressing toward goals    Frequency    7X/week      PT Plan Current plan remains appropriate    Co-evaluation              AM-PAC PT "  6 Clicks" Mobility   Outcome Measure  Help needed turning from your back to your side while in a flat bed without using bedrails?: A Little Help needed moving from lying on your back to sitting on the side of a flat bed without using bedrails?: A Little Help needed moving to and from a bed to a chair (including a wheelchair)?: A Little Help needed standing up from a chair using your arms (e.g., wheelchair or bedside chair)?: A Little Help needed to walk in hospital room?: A Little Help needed climbing 3-5 steps with a railing? : A Lot 6 Click Score: 17    End of Session Equipment Utilized During Treatment: Gait belt Activity Tolerance: Patient limited by fatigue;Patient limited by pain Patient left: with call bell/phone within reach;in chair;with chair alarm set Nurse Communication: Mobility status PT Visit Diagnosis: Muscle weakness (generalized)  (M62.81);Difficulty in walking, not elsewhere classified (R26.2)     Time: 1405-1430 PT Time Calculation (min) (ACUTE ONLY): 25 min  Charges:  $Gait Training: 8-22 mins $Therapeutic Exercise: 8-22 mins                     {Jozsef Wescoat  PTA Acute  Rehabilitation Services Pager      [phone] Office      [phone]

## 2019-08-24 NOTE — Discharge Summary (Signed)
Orthopedic Discharge Summary        Physician Discharge Summary  Patient ID: Christina [name] MRN: [id number] DOB/AGE: 1951/04/12 69 y.o.  Admit date: 08/23/2019 Discharge date: 08/24/2019   Procedures:  Procedure(s) (LRB): TOTAL KNEE ARTHROPLASTY (Right)  Attending Physician:  Dr. Malon Kindle  Admission Diagnoses:  Right knee OA end stage  Discharge Diagnoses: same   Past Medical History:  Diagnosis Date  . Allergic rhinitis   . Clotting disorder (HCC)   . DVT (deep venous thrombosis) (HCC) 1990's  . DVT (deep venous thrombosis) (HCC) 1980s   in the leg behind my knee;  right   . Epicondylitis    right   . GERD (gastroesophageal reflux disease)   . Hyperlipidemia   . Hypertension   . Osteoarthritis   . Pulmonary embolism (HCC) 1990's    PCP: Bennie Pierini, FNP   Discharged Condition: good  Hospital Course:  Patient underwent the above stated procedure on 08/23/2019. Patient tolerated the procedure well and brought to the recovery room in good condition and subsequently to the floor. Patient had an uncomplicated hospital course and was stable for discharge.   Disposition: Discharge disposition: 01-Home or Self Care      with follow up in 2 weeks   Follow-up Information    Beverely Low, MD. Call in 2 weeks.   Specialty: Orthopedic Surgery Why: [phone] Contact information: [address] [place] [address] [place] [address] [phone]           Discharge Instructions    Call MD / Call 911   Complete by: As directed    If you experience chest pain or shortness of breath, CALL 911 and be transported to the hospital emergency room.  If you develope a fever above 101 F, pus (white drainage) or increased drainage or redness at the wound, or calf pain, call your surgeon's office.   Constipation Prevention   Complete by: As directed    Drink plenty of fluids.  Prune juice may be helpful.  You may use a stool softener, such as Colace (over  the counter) 100 mg twice a day.  Use MiraLax (over the counter) for constipation as needed.   Diet - low sodium heart healthy   Complete by: As directed    Increase activity slowly as tolerated   Complete by: As directed       Allergies as of 08/24/2019      Reactions   Ace Inhibitors Cough         Medication List    STOP taking these medications   colchicine 0.6 MG tablet   furosemide 20 MG tablet Commonly known as: LASIX   omeprazole 40 MG capsule Commonly known as: PRILOSEC     TAKE these medications   amLODipine 5 MG tablet Commonly known as: NORVASC TAKE 1 TABLET BY MOUTH EVERY DAY   aspirin 81 MG chewable tablet Chew 1 tablet (81 mg total) by mouth 2 (two) times daily.   diclofenac sodium 1 % Gel Commonly known as: VOLTAREN Apply 2 g topically 4 (four) times daily. What changed:   when to take this  reasons to take this   methocarbamol 500 MG tablet Commonly known as: Robaxin Take 1 tablet (500 mg total) by mouth 3 (three) times daily as needed.   olmesartan 40 MG tablet Commonly known as: BENICAR TAKE 1 TABLET BY MOUTH EVERY DAY   oxyCODONE-acetaminophen 5-325 MG tablet Commonly known as: Percocet Take 1 tablet by mouth every 4 (four)  hours as needed for severe pain.   pantoprazole 40 MG tablet Commonly known as: PROTONIX Take 1 tablet (40 mg total) by mouth daily.   warfarin 10 MG tablet Commonly known as: COUMADIN Take as directed. If you are unsure how to take this medication, talk to your nurse or doctor. Original instructions: TAKE 1 TABLET BY MOUTH EVERY DAY What changed:   how much to take  when to take this  additional instructions         Signed: Augustin Schooling 08/24/2019, 9:07 AM  [place] is now [facility] [address] [place], [place], [place] [address] Phone: [place]

## 2019-08-25 DIAGNOSIS — M1711 Unilateral primary osteoarthritis, right knee: Secondary | ICD-10-CM | POA: Diagnosis not present

## 2019-08-25 LAB — CBC
HCT: 38.4 % (ref 36.0–46.0)
Hemoglobin: 12.1 g/dL (ref 12.0–15.0)
MCH: 25.7 pg — ABNORMAL LOW (ref 26.0–34.0)
MCHC: 31.5 g/dL (ref 30.0–36.0)
MCV: 81.5 fL (ref 80.0–100.0)
Platelets: 220 10*3/uL (ref 150–400)
RBC: 4.71 MIL/uL (ref 3.87–5.11)
RDW: 13.2 % (ref 11.5–15.5)
WBC: 8.5 10*3/uL (ref 4.0–10.5)
nRBC: 0 % (ref 0.0–0.2)

## 2019-08-25 LAB — PROTIME-INR
INR: 1.5 — ABNORMAL HIGH (ref 0.8–1.2)
Prothrombin Time: 18.4 seconds — ABNORMAL HIGH (ref 11.4–15.2)

## 2019-08-25 MED ORDER — WARFARIN SODIUM 5 MG PO TABS: 5.0000 mg | ORAL_TABLET | Freq: Once | ORAL | Status: DC

## 2019-08-25 MED ORDER — METHOCARBAMOL 500 MG PO TABS: 500.0000 mg | ORAL_TABLET | Freq: Three times a day (TID) | ORAL | 0 refills | Status: DC | PRN

## 2019-08-25 NOTE — Progress Notes (Signed)
ANTICOAGULATION CONSULT NOTE  Pharmacy Consult for warfarin Indication: History of DVT/PE, continued s/p R TKA  Allergies  Allergen Reactions  . Ace Inhibitors Cough        Patient Measurements: Height: 5\' 8"  (172.7 cm) Weight: 189 lb (85.7 kg) IBW/kg (Calculated) : 63.9  Vital Signs: Temp: 98.5 F (36.9 C) (01/10 0508) Temp Source: Oral (01/10 0508) BP: 140/82 (01/10 0508) Pulse Rate: 87 (01/10 0508)  Labs: Recent Labs    08/23/19 0602 08/24/19 0432 08/25/19 0442  HGB  --  12.7 12.1  HCT  --  41.4 38.4  PLT  --  234 220  LABPROT 14.6 14.7 18.4*  INR 1.1 1.2 1.5*  CREATININE  --  1.08*  --    Estimated Creatinine Clearance: 55.6 mL/min (A) (by C-G formula based on SCr of 1.08 mg/dL (H)).  Medical History: Past Medical History:  Diagnosis Date  . Allergic rhinitis   . Clotting disorder (HCC)   . DVT (deep venous thrombosis) (HCC) 1990's  . DVT (deep venous thrombosis) (HCC) 1980s   in the leg behind my knee;  right   . Epicondylitis    right   . GERD (gastroesophageal reflux disease)   . Hyperlipidemia   . Hypertension   . Osteoarthritis   . Pulmonary embolism (HCC) 1990's   Assessment: Total knee arthroplasty performed today.  Pharmacy consulted to dose warfarin which is being resumed post-op.  Warfarin regimen per anticoagulation clinic is 5mg  po qday except 10mg  on Fridays. Last dose 1/2  Goal of Therapy:  INR 2-3 Monitor platelets by anticoagulation protocol: Yes   Today, 08/25/2019 INR 1.5 after 10, 7.5mg  Warfarin   Plan:  Warfarin 5mg  today at 1800 Daily Protime/INR,  Plan discharge home 1/10  , PharmD 08/25/2019,9:57 AM

## 2019-08-25 NOTE — Progress Notes (Signed)
Pt stable at time of d/c home with family. No questions on d/c instructions or d/c education given. Pt has needed dme. Pt with tolerable pain at d/c. Pt dressing normal at time of d/c. Pt to follow up with md.

## 2019-08-25 NOTE — Plan of Care (Signed)
  Problem: Clinical Measurements: Goal: Respiratory complications will improve Outcome: Progressing   Problem: Activity: Goal: Risk for activity intolerance will decrease Outcome: Progressing   Problem: Nutrition: Goal: Adequate nutrition will be maintained Outcome: Progressing   Problem: Pain Managment: Goal: General experience of comfort will improve Outcome: Progressing   

## 2019-08-25 NOTE — Progress Notes (Signed)
Subjective: 2 Days Post-Op Procedure(s) (LRB): TOTAL KNEE ARTHROPLASTY (Right)  Patient reports pain as mild to moderate.  Reports that pain is improving daily.  Notes that she worked well with therapy.  Tolerating POs well.  States that she's ready to go home today.  Objective:   VITALS:  Temp:  [98.2 F (36.8 C)-98.8 F (37.1 C)] 98.5 F (36.9 C) (01/10 0508) Pulse Rate:  [83-92] 87 (01/10 0508) Resp:  [16-18] 18 (01/10 0508) BP: (126-155)/(73-88) 140/82 (01/10 0508) SpO2:  [98 %-100 %] 100 % (01/10 0508)  General: WDWN patient in NAD. Psych:  Appropriate mood and affect. Neuro:  A&O x 3, Moving all extremities, sensation intact to light touch HEENT:  EOMs intact Chest:  Even non-labored respirations Skin:  Dressing C/D/I, no rashes or lesions Extremities: warm/dry, mild edema to R LE, no erythema or echymosis.  No lymphadenopathy. Pulses: Popliteus 2+ MSK:  ROM: lacks 5 degrees TKE, MMT: able to perform quad set    LABS Recent Labs    08/24/19 0432 08/25/19 0442  HGB 12.7 12.1  WBC 11.5* 8.5  PLT 234 220   Recent Labs    08/24/19 0432  NA 140  K 4.6  CL 107  CO2 22  BUN 15  CREATININE 1.08*  GLUCOSE 100*   Recent Labs    08/24/19 0432 08/25/19 0442  INR 1.2 1.5*     Assessment/Plan: 2 Days Post-Op Procedure(s) (LRB): TOTAL KNEE ARTHROPLASTY (Right)  Patient seen in round for Dr. Ranell Patrick  D/C home with HHPT/OT today after working with therapy  DVT ppx: coumadin (per pharmacy dosing), 81mg  ASA BID until therapeutic  Follow up with Dr. in 2 weeks in outpatient setting.  Ranell Patrick PA-C [facility] Office:  [phone]

## 2019-08-25 NOTE — Progress Notes (Signed)
Physical Therapy Treatment Patient Details Name: Christina [name] MRN: [id number] DOB: 11/16/1950 Today's Date: 08/25/2019    History of Present Illness Patient is 69 y.o. female s/p Rt TKA on 08/23/19 with PMH significant for HTN, HLD, OA, GERD, and DVT.    PT Comments    Pt is progressing well, pain improved today. incr amb distance. Reviewed stairs/HEP/use of ice/importance of mobility at home as well as use of KI. Pt ready to d/c today from PT standpoint   Follow Up Recommendations  Follow surgeon's recommendation for DC plan and follow-up therapies     Equipment Recommendations  Rolling walker with 5" wheels;3in1 (PT)    Recommendations for Other Services       Precautions / Restrictions Precautions Precautions: Fall;Knee Precaution Comments: instructed no pillow under knee Restrictions Weight Bearing Restrictions: No Other Position/Activity Restrictions: WBAT    Mobility  Bed Mobility               General bed mobility comments: pt in bathroom on arrival  Transfers Overall transfer level: Needs assistance Equipment used: Rolling walker (2 wheeled) Transfers: Sit to/from Stand Sit to Stand: Supervision         General transfer comment: for safety  Ambulation/Gait Ambulation/Gait assistance: Supervision Gait Distance (Feet): 160 Feet Assistive device: Rolling walker (2 wheeled) Gait Pattern/deviations: Step-to pattern;Step-through pattern;Decreased stride length;Decreased weight shift to right     General Gait Details: cues for progression, improved wt shift end of distance    Stairs Stairs: Yes Stairs assistance: Min guard Stair Management: No rails;Step to pattern;Backwards;With walker Number of Stairs: 1 General stair comments: cues for sequence and technique, min/guard for safety    Wheelchair Mobility    Modified Rankin (Stroke Patients Only)       Balance                                            Cognition  Arousal/Alertness: Awake/alert Behavior During Therapy: WFL for tasks assessed/performed Overall Cognitive Status: Within Functional Limits for tasks assessed                                        Exercises Total Joint Exercises Ankle Circles/Pumps: AROM;10 reps;Both Quad Sets: AROM;Both;10 reps    General Comments        Pertinent Vitals/Pain Pain Assessment: 0-10 Pain Score: 3  Pain Location: R knee Pain Descriptors / Indicators: Discomfort;Tender;Operative site guarding Pain Intervention(s): Limited activity within patient's tolerance;Monitored during session;Premedicated before session;Repositioned    Home Living                      Prior Function            PT Goals (current goals can now be found in the care plan section) Acute Rehab PT Goals Patient 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 PT Goal Formulation: With patient Time For Goal Achievement: 08/30/19 Potential to Achieve Goals: Good Progress towards PT goals: Progressing toward goals    Frequency    7X/week      PT Plan Current plan remains appropriate    Co-evaluation              AM-PAC PT "6 Clicks" Mobility   Outcome Measure  Help  needed turning from your back to your side while in a flat bed without using bedrails?: None Help needed moving from lying on your back to sitting on the side of a flat bed without using bedrails?: None Help needed moving to and from a bed to a chair (including a wheelchair)?: None Help needed standing up from a chair using your arms (e.g., wheelchair or bedside chair)?: None Help needed to walk in hospital room?: A Little Help needed climbing 3-5 steps with a railing? : A Little 6 Click Score: 22    End of Session Equipment Utilized During Treatment: Gait belt Activity Tolerance: Patient tolerated treatment well Patient left: with call bell/phone within reach;in chair;with chair alarm set Nurse  Communication: Mobility status PT Visit Diagnosis: Muscle weakness (generalized) (M62.81);Difficulty in walking, not elsewhere classified (R26.2)     Time: 5537-4827 PT Time Calculation (min) (ACUTE ONLY): 23 min  Charges:  $Gait Training: 23-37 mins                     Reida Hem, PT   Acute Rehab Dept [facility]): [phone]   08/25/2019    [facility] 08/25/2019, 10:20 AM

## 2019-08-26 ENCOUNTER — Encounter: Payer: Self-pay | Admitting: *Deleted

## 2019-08-26 ENCOUNTER — Telehealth: Payer: Self-pay | Admitting: Nurse Practitioner

## 2019-08-26 NOTE — Telephone Encounter (Signed)
Yes that is okay for now

## 2019-08-26 NOTE — Telephone Encounter (Signed)
Patient aware.

## 2019-08-26 NOTE — Telephone Encounter (Signed)
Patient states that she was prescribed warfarin and aspirin.  Does patient need to take both

## 2019-08-27 ENCOUNTER — Encounter (INDEPENDENT_AMBULATORY_CARE_PROVIDER_SITE_OTHER): Payer: Self-pay

## 2019-09-03 ENCOUNTER — Ambulatory Visit (INDEPENDENT_AMBULATORY_CARE_PROVIDER_SITE_OTHER): Payer: BC Managed Care – PPO | Admitting: Internal Medicine

## 2019-09-03 ENCOUNTER — Encounter (INDEPENDENT_AMBULATORY_CARE_PROVIDER_SITE_OTHER): Payer: Self-pay | Admitting: Internal Medicine

## 2019-09-03 ENCOUNTER — Other Ambulatory Visit: Payer: Self-pay

## 2019-09-03 DIAGNOSIS — R945 Abnormal results of liver function studies: Secondary | ICD-10-CM | POA: Diagnosis not present

## 2019-09-03 DIAGNOSIS — R7989 Other specified abnormal findings of blood chemistry: Secondary | ICD-10-CM | POA: Insufficient documentation

## 2019-09-03 NOTE — Patient Instructions (Signed)
Patient to have LFTs within the next 2 weeks or so. I will contact patient when results available.

## 2019-09-03 NOTE — Progress Notes (Addendum)
Virtual Visit via Telephone Note  Patient had scheduled face-to-face visit today.  It was decided to change visit to virtual/telephone visit because of ongoing Covid-19 pandemic and we both agree. I connected with Christina [name] on 09/03/19 at  16:48 PM EST by telephone and verified that I am speaking with the correct person using two identifiers.  Location: Patient: home Provider: ofice    I discussed the limitations, risks, security and privacy concerns of performing an evaluation and management service by telephone and the availability of in person appointments. I also discussed with the patient that there may be a patient responsible charge related to this service. The patient expressed understanding and agreed to proceed.   History of Present Illness:  Patient is 69 year old female who has history of elevated transaminases dating back to October 2017.  Transaminases were normal in April 2017.  Acute hepatitis panel was negative in May 2018.  Ultrasound revealed no abnormality to gallbladder liver and bile duct.  The study suggested mass involving right kidney which was further evaluated with MR which revealed complex cyst Bosniak 67F lesion.  She had MR in September 2019 and no abnormality was noted to hepatobiliary system and renal cyst was felt to be stable. She had normal ceruloplasmin, alpha 1 antitrypsin and ferritin levels.  ANA was positive at a low titer but IgG was normal and sed rate was 4.  It was decided to monitor her LFT trend. Review of her records revealed that she had abdominal ultrasound on 06/10/2019 revealing no abnormality to liver bile duct or pancreas as before and right renal cyst was noted to be smaller than on prior studies.  She had another 9 mm cyst in mid right kidney.  Patient reports having right knee replacement on 08/23/2019.  She says she is doing well and having a rapid recovery.  She is getting physical therapy.  She says even prior to surgery she was walking  regularly.  She says she has lost 16 pounds in the last 1 year.  Her appetite is good but she is watching calorie intake and has cut way back.  She denies abdominal pain pruritus nausea vomiting melena or rectal bleeding.  She says heartburn is well controlled with therapy.   Current Outpatient Medications:  .  amLODipine (NORVASC) 5 MG tablet, TAKE 1 TABLET BY MOUTH EVERY DAY (Patient taking differently: Take 5 mg by mouth daily. ), Disp: 90 tablet, Rfl: 04 .  aspirin 81 MG chewable tablet, Chew 1 tablet (81 mg total) by mouth 2 (two) times daily., Disp: 30 tablet, Rfl: 0 .  diclofenac sodium (VOLTAREN) 1 % GEL, Apply 2 g topically 4 (four) times daily. (Patient taking differently: Apply 2 g topically 3 (three) times daily as needed (knee pain). ), Disp: 350 g, Rfl: 0 .  methocarbamol (ROBAXIN) 500 MG tablet, Take 1 tablet (500 mg total) by mouth 3 (three) times daily as needed., Disp: 60 tablet, Rfl: 1 .  methocarbamol (ROBAXIN) 500 MG tablet, Take 1 tablet (500 mg total) by mouth every 8 (eight) hours as needed for muscle spasms (spasms)., Disp: 30 tablet, Rfl: 0 .  olmesartan (BENICAR) 40 MG tablet, TAKE 1 TABLET BY MOUTH EVERY DAY, Disp: 30 tablet, Rfl: 0 .  oxyCODONE-acetaminophen (PERCOCET) 5-325 MG tablet, Take 1 tablet by mouth every 4 (four) hours as needed for severe pain., Disp: 20 tablet, Rfl: 0 .  pantoprazole (PROTONIX) 40 MG tablet, Take 1 tablet (40 mg total) by mouth daily., Disp: 30 tablet, Rfl:  3 .  warfarin (COUMADIN) 10 MG tablet, TAKE 1 TABLET BY MOUTH EVERY DAY (Patient taking differently: Take 5-10 mg by mouth See admin instructions. Take 5 mg by mouth on Monday, Tuesday, Wednesday, Thursday, Saturday and Sunday; on Friday take 10 mg), Disp: 90 tablet, Rfl: 1   Observations/Objective:  Patient reported her weight to be 188 pounds She weighed 204 pounds on 08/28/2018.  LFTs from 08/06/2018 Bilirubin 0.3, AP 118, AST 85, ALT 65 total protein 5.5 and albumin 3.5. No recent  LFTs available.  Assessment and Plan:  #1.  Chronically elevated LFTs with downward trend between August 2019 in December 2019.  Work-up thus far has been negative.  She does not have stigmata of portal hypertension.  Working diagnosis is fatty liver but imaging studies do not support this diagnosis.  If indeed elevated transaminases secondary to fatty liver 1 would expect improvement since she has been more active and has lost 16 pounds.  If not she may need repeat markers for autoimmune hepatitis.  Follow Up Instructions:  Patient will go to the lab for LFTs within the next 2 weeks. Further recommendations to follow. I discussed the assessment and treatment plan with the patient. The patient was provided an opportunity to ask questions and all were answered. The patient agreed with the plan and demonstrated an understanding of the instructions.   The patient was advised to call back or seek an in-person evaluation if the symptoms worsen or if the condition fails to improve as anticipated.  I provided 8 minutes of non-face-to-face time during this encounter.   Hildred Laser, MD

## 2019-09-05 ENCOUNTER — Other Ambulatory Visit: Payer: Self-pay | Admitting: Nurse Practitioner

## 2019-09-05 DIAGNOSIS — I10 Essential (primary) hypertension: Secondary | ICD-10-CM

## 2019-09-10 ENCOUNTER — Other Ambulatory Visit: Payer: Self-pay

## 2019-09-11 ENCOUNTER — Encounter: Payer: Self-pay | Admitting: Nurse Practitioner

## 2019-09-11 ENCOUNTER — Ambulatory Visit (INDEPENDENT_AMBULATORY_CARE_PROVIDER_SITE_OTHER): Payer: BC Managed Care – PPO | Admitting: Nurse Practitioner

## 2019-09-11 VITALS — BP 110/72 | HR 101 | Temp 96.2°F | Resp 20 | Ht 69.0 in | Wt 182.0 lb

## 2019-09-11 DIAGNOSIS — Z86718 Personal history of other venous thrombosis and embolism: Secondary | ICD-10-CM | POA: Diagnosis not present

## 2019-09-11 DIAGNOSIS — Z7901 Long term (current) use of anticoagulants: Secondary | ICD-10-CM

## 2019-09-11 LAB — COAGUCHEK XS/INR WAIVED
INR: 3.3 — ABNORMAL HIGH (ref 0.9–1.1)
Prothrombin Time: 40.2 s

## 2019-09-11 NOTE — Progress Notes (Addendum)
Subjective:    Chief complaint: INR recheck  * Patient had a total knee replacement on January 8,2021 and has been on 2 aspirins daily since surgery.   Indication: DVT Bleeding signs/symptoms: None Thromboembolic signs/symptoms: None  Missed Coumadin doses: None Medication changes: no Dietary changes: no Bacterial/viral infection: no Other concerns: no  The following portions of the patient's history were reviewed and updated as appropriate: allergies, current medications, past family history, past medical history, past social history, past surgical history and problem list.  Review of Systems Pertinent items noted in HPI and remainder of comprehensive ROS otherwise negative.   Objective:    INR Today:     3.3          Current dose: coumadin 5mg  daily except 10mg  on friday    Assessment:    Therapeutic INR for goal of 2-3   Plan:    1. New dose: coumadin 5mg  daily   2. Next INR: 2 weeks    Mary-Margaret , FNP

## 2019-09-13 LAB — HEPATIC FUNCTION PANEL
AG Ratio: 1.6 (calc) (ref 1.0–2.5)
ALT: 25 U/L (ref 6–29)
AST: 48 U/L — ABNORMAL HIGH (ref 10–35)
Albumin: 3.7 g/dL (ref 3.6–5.1)
Alkaline phosphatase (APISO): 125 U/L (ref 37–153)
Bilirubin, Direct: 0.1 mg/dL (ref 0.0–0.2)
Globulin: 2.3 g/dL (calc) (ref 1.9–3.7)
Indirect Bilirubin: 0.4 mg/dL (calc) (ref 0.2–1.2)
Total Bilirubin: 0.5 mg/dL (ref 0.2–1.2)
Total Protein: 6 g/dL — ABNORMAL LOW (ref 6.1–8.1)

## 2019-09-17 ENCOUNTER — Other Ambulatory Visit (INDEPENDENT_AMBULATORY_CARE_PROVIDER_SITE_OTHER): Payer: Self-pay | Admitting: *Deleted

## 2019-09-17 DIAGNOSIS — R945 Abnormal results of liver function studies: Secondary | ICD-10-CM

## 2019-09-17 DIAGNOSIS — R7989 Other specified abnormal findings of blood chemistry: Secondary | ICD-10-CM

## 2019-09-18 ENCOUNTER — Telehealth: Payer: Self-pay | Admitting: Nurse Practitioner

## 2019-09-18 NOTE — Telephone Encounter (Signed)
Please tell her tylenol is preferred, but she can use ibuprofen as well if needed. Do not exceed 400 mg per dose. WS

## 2019-09-18 NOTE — Telephone Encounter (Signed)
Patient of Paulene Floor

## 2019-09-19 NOTE — Telephone Encounter (Signed)
Patient takes coumadin. Her liver functions are high what can she take for the knee pain

## 2019-09-20 NOTE — Telephone Encounter (Signed)
She will need ot contact her surgeon about this. I would rather her not do tylenol due to previous LFT elevation.. SHe cannot due ibuprofen due to her coumadin.

## 2019-09-20 NOTE — Telephone Encounter (Signed)
Patient answered the phone and was instructed to call surgeon for advise on knee pain medications.

## 2019-09-26 ENCOUNTER — Other Ambulatory Visit: Payer: Self-pay

## 2019-09-26 ENCOUNTER — Ambulatory Visit (INDEPENDENT_AMBULATORY_CARE_PROVIDER_SITE_OTHER): Payer: BC Managed Care – PPO | Admitting: Nurse Practitioner

## 2019-09-26 ENCOUNTER — Encounter: Payer: Self-pay | Admitting: Nurse Practitioner

## 2019-09-26 VITALS — BP 116/71 | HR 110 | Temp 97.1°F | Resp 20 | Ht 69.0 in | Wt 180.0 lb

## 2019-09-26 DIAGNOSIS — Z7901 Long term (current) use of anticoagulants: Secondary | ICD-10-CM

## 2019-09-26 DIAGNOSIS — Z86718 Personal history of other venous thrombosis and embolism: Secondary | ICD-10-CM

## 2019-09-26 LAB — COAGUCHEK XS/INR WAIVED
INR: 3.1 — ABNORMAL HIGH (ref 0.9–1.1)
Prothrombin Time: 36.7 s

## 2019-09-26 NOTE — Progress Notes (Signed)
Subjective:   Chief complaint: INR recheck   Indication: DVT Bleeding signs/symptoms: None Thromboembolic signs/symptoms: None  Missed Coumadin doses: None Medication changes: no Dietary changes: no Bacterial/viral infection: no Other concerns: no  The following portions of the patient's history were reviewed and updated as appropriate: allergies, current medications, past family history, past medical history, past social history, past surgical history and problem list.  Review of Systems Pertinent items noted in HPI and remainder of comprehensive ROS otherwise negative.   Objective:    INR Today: 3.1 Current dose: coumadin 5mg  daily    Assessment:    Therapeutic INR for goal of 2-3   Plan:    1. New dose: no change   2. Next INR: 1 month     Mary-Margaret , FNP

## 2019-10-03 ENCOUNTER — Other Ambulatory Visit: Payer: Self-pay | Admitting: Nurse Practitioner

## 2019-10-03 DIAGNOSIS — I10 Essential (primary) hypertension: Secondary | ICD-10-CM

## 2019-10-24 ENCOUNTER — Encounter: Payer: Self-pay | Admitting: Nurse Practitioner

## 2019-10-24 ENCOUNTER — Other Ambulatory Visit: Payer: Self-pay

## 2019-10-24 ENCOUNTER — Ambulatory Visit: Payer: BC Managed Care – PPO | Admitting: Nurse Practitioner

## 2019-10-24 VITALS — BP 129/74 | HR 110 | Temp 98.2°F | Resp 20 | Ht 69.0 in | Wt 177.0 lb

## 2019-10-24 DIAGNOSIS — Z86718 Personal history of other venous thrombosis and embolism: Secondary | ICD-10-CM

## 2019-10-24 DIAGNOSIS — Z7901 Long term (current) use of anticoagulants: Secondary | ICD-10-CM

## 2019-10-24 LAB — COAGUCHEK XS/INR WAIVED
INR: 2.5 — ABNORMAL HIGH (ref 0.9–1.1)
Prothrombin Time: 30.2 s

## 2019-10-24 NOTE — Progress Notes (Signed)
Subjective:   Chief Complaint: INR recheck    Indication: DVT Bleeding signs/symptoms: None Thromboembolic signs/symptoms: None  Missed Coumadin doses: None Medication changes: no Dietary changes: no Bacterial/viral infection: no Other concerns: no  The following portions of the patient's history were reviewed and updated as appropriate: allergies, current medications, past family history, past medical history, past social history, past surgical history and problem list.  Review of Systems Pertinent items noted in HPI and remainder of comprehensive ROS otherwise negative.   Objective:    INR Today: 2.5 Current dose: coumadin 5mg      Assessment:    Therapeutic INR for goal of 2-3   Plan:    1. New dose: no change   2. Next INR: 1 month    Mary-Margaret , FNP

## 2019-10-25 ENCOUNTER — Other Ambulatory Visit: Payer: Self-pay | Admitting: Nurse Practitioner

## 2019-10-25 DIAGNOSIS — K219 Gastro-esophageal reflux disease without esophagitis: Secondary | ICD-10-CM

## 2019-11-07 ENCOUNTER — Ambulatory Visit (INDEPENDENT_AMBULATORY_CARE_PROVIDER_SITE_OTHER): Payer: BC Managed Care – PPO | Admitting: Nurse Practitioner

## 2019-11-07 ENCOUNTER — Other Ambulatory Visit: Payer: Self-pay

## 2019-11-07 ENCOUNTER — Encounter: Payer: Self-pay | Admitting: Nurse Practitioner

## 2019-11-07 VITALS — BP 112/70 | HR 116 | Temp 97.5°F

## 2019-11-07 DIAGNOSIS — R000 Tachycardia, unspecified: Secondary | ICD-10-CM

## 2019-11-07 DIAGNOSIS — Z024 Encounter for examination for driving license: Secondary | ICD-10-CM

## 2019-11-07 LAB — URINALYSIS
Bilirubin, UA: NEGATIVE
Glucose, UA: NEGATIVE
Nitrite, UA: NEGATIVE
Protein,UA: NEGATIVE
Specific Gravity, UA: 1.02 (ref 1.005–1.030)
Urobilinogen, Ur: 2 mg/dL — ABNORMAL HIGH (ref 0.2–1.0)
pH, UA: 6 (ref 5.0–7.5)

## 2019-11-07 MED ORDER — METOPROLOL SUCCINATE ER 25 MG PO TB24: 25.0000 mg | ORAL_TABLET | Freq: Every day | ORAL | 3 refills | Status: DC

## 2019-11-07 NOTE — Addendum Note (Signed)
Addended by: Bennie Pierini on: 11/07/2019 04:19 PM   Modules accepted: Orders

## 2019-11-07 NOTE — Progress Notes (Signed)
   Subjective:    Patient ID: Christina [name], female    DOB: 1951/03/04, 69 y.o.   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 When vital signs were gotten , her heart rate was 127. She denies any chest pain or SOB. She said she did not know her heart was racing. Her heart rate is usually around 70. Has been up the last 2 visits for INR.   Review of Systems  Constitutional: Positive for unexpected weight change.  Respiratory: Negative for shortness of breath.   Cardiovascular: Negative for chest pain, palpitations and leg swelling.  Neurological: Negative for dizziness.  All other systems reviewed and are negative.      Objective:   Physical Exam Vitals and nursing note reviewed.  Constitutional:      General: She is not in acute distress.    Appearance: Normal appearance. She is well-developed.  HENT:     Head: Normocephalic.     Nose: Nose normal.  Eyes:     Pupils: Pupils are equal, round, and reactive to light.  Neck:     Vascular: No carotid bruit or JVD.  Cardiovascular:     Rate and Rhythm: Normal rate and regular rhythm.     Heart sounds: Normal heart sounds.  Pulmonary:     Effort: Pulmonary effort is normal. No respiratory distress.     Breath sounds: Normal breath sounds. No wheezing or rales.  Chest:     Chest wall: No tenderness.  Abdominal:     General: Bowel sounds are normal. There is no distension or abdominal bruit.     Palpations: Abdomen is soft. There is no hepatomegaly, splenomegaly, mass or pulsatile mass.     Tenderness: There is no abdominal tenderness.  Musculoskeletal:        General: Normal range of motion.     Cervical back: Normal range of motion and neck supple.  Lymphadenopathy:     Cervical: No cervical adenopathy.  Skin:    General: Skin is warm and dry.  Neurological:     Mental Status: She is alert and oriented to person, place, and time.     Deep Tendon Reflexes: Reflexes are normal and symmetric.  Psychiatric:       Behavior: Behavior normal.        Thought Content: Thought content normal.        Judgment: Judgment normal.      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 Tachycardia avoid caffeine - CMP14+EGFR - Thyroid Panel With TSH - CBC with Differential/Platelet - EKG 12-Lead  2. Encounter for Department of Transportation (DOT) examination for driving license renewal Will hold off on DOT until can get heart rate strightened out - Urinalysis    The above assessment and management plan was discussed with the patient. The patient verbalized understanding of and has agreed to the management plan. Patient is aware to call the clinic if symptoms persist or worsen. Patient is aware when to return to the clinic for a follow-up visit. Patient educated on when it is appropriate to go to the emergency department.   Mary-Margaret Hassell Done, FNP

## 2019-11-07 NOTE — Patient Instructions (Signed)
Sinus Tachycardia  Sinus tachycardia is a kind of fast heartbeat. In sinus tachycardia, the heart beats more than 100 times a minute. Sinus tachycardia starts in a part of the heart called the sinus node. Sinus tachycardia may be harmless, or it may be a sign of a serious condition. What are the causes? This condition may be caused by:  Exercise or exertion.  A fever.  Pain.  Loss of body fluids (dehydration).  Severe bleeding (hemorrhage).  Anxiety and stress.  Certain substances, including: ? Alcohol. ? Caffeine. ? Tobacco and nicotine products. ? Cold medicines. ? Illegal drugs.  Medical conditions including: ? Heart disease. ? An infection. ? An overactive thyroid (hyperthyroidism). ? A lack of red blood cells (anemia). What are the signs or symptoms? Symptoms of this condition include:  A feeling that the heart is beating quickly (palpitations).  Suddenly noticing your heartbeat (cardiac awareness).  Dizziness.  Tiredness (fatigue).  Shortness of breath.  Chest pain.  Nausea.  Fainting. How is this diagnosed? This condition is diagnosed with:  A physical exam.  Other tests, such as: ? Blood tests. ? An electrocardiogram (ECG). This test measures the electrical activity of the heart. ? Ambulatory cardiac monitor. This records your heartbeats for 24 hours or more. You may be referred to a heart specialist (cardiologist). How is this treated? Treatment for this condition depends on the cause or the underlying condition. Treatment may involve:  Treating the underlying condition.  Taking new medicines or changing your current medicines as told by your health care provider.  Making changes to your diet or lifestyle. Follow these instructions at home: Lifestyle   Do not use any products that contain nicotine or tobacco, such as cigarettes and e-cigarettes. If you need help quitting, ask your health care provider.  Do not use illegal drugs, such as  cocaine.  Learn relaxation methods to help you when you get stressed or anxious. These include deep breathing.  Avoid caffeine or other stimulants. Alcohol use   Do not drink alcohol if: ? Your health care provider tells you not to drink. ? You are pregnant, may be pregnant, or are planning to become pregnant.  If you drink alcohol, limit how much you have: ? 0-1 drink a day for women. ? 0-2 drinks a day for men.  Be aware of how much alcohol is in your drink. In the [place] one drink equals one typical bottle of beer (12 oz), one-half glass of wine (5 oz), or one shot of hard liquor (1 oz). General instructions  Drink enough fluids to keep your urine pale yellow.  Take over-the-counter and prescription medicines only as told by your health care provider.  Keep all follow-up visits as told by your health care provider. This is important. Contact a health care provider if you have:  A fever.  Vomiting or diarrhea that does not go away. Get help right away if you:  Have pain in your chest, upper arms, jaw, or neck.  Become weak or dizzy.  Feel faint.  Have palpitations that do not go away. Summary  In sinus tachycardia, the heart beats more than 100 times a minute.  Sinus tachycardia may be harmless, or it may be a sign of a serious condition.  Treatment for this condition depends on the cause or the underlying condition.  Get help right away if you have pain in your chest, upper arms, jaw, or neck. This information is not intended to replace advice given to you by   your health care provider. Make sure you discuss any questions you have with your health care provider. Document Revised: 09/20/2017 Document Reviewed: 09/20/2017 Elsevier Patient Education  2020 Elsevier Inc.  

## 2019-11-07 NOTE — Progress Notes (Deleted)
Patient ID: Christina [name], female   DOB: 1951/04/15, 69 y.o.   MRN: [id number]   Private DOT physical

## 2019-11-08 ENCOUNTER — Telehealth: Payer: Self-pay | Admitting: Nurse Practitioner

## 2019-11-08 LAB — CBC WITH DIFFERENTIAL/PLATELET
Basophils Absolute: 0 10*3/uL (ref 0.0–0.2)
Basos: 0 %
EOS (ABSOLUTE): 0 10*3/uL (ref 0.0–0.4)
Eos: 1 %
Hematocrit: 44 % (ref 34.0–46.6)
Hemoglobin: 14.2 g/dL (ref 11.1–15.9)
Immature Grans (Abs): 0 10*3/uL (ref 0.0–0.1)
Immature Granulocytes: 0 %
Lymphocytes Absolute: 1.3 10*3/uL (ref 0.7–3.1)
Lymphs: 21 %
MCH: 25.4 pg — ABNORMAL LOW (ref 26.6–33.0)
MCHC: 32.3 g/dL (ref 31.5–35.7)
MCV: 79 fL (ref 79–97)
Monocytes Absolute: 0.7 10*3/uL (ref 0.1–0.9)
Monocytes: 10 %
Neutrophils Absolute: 4.3 10*3/uL (ref 1.4–7.0)
Neutrophils: 68 %
Platelets: 308 10*3/uL (ref 150–450)
RBC: 5.59 x10E6/uL — ABNORMAL HIGH (ref 3.77–5.28)
RDW: 13.4 % (ref 11.7–15.4)
WBC: 6.4 10*3/uL (ref 3.4–10.8)

## 2019-11-08 LAB — THYROID PANEL WITH TSH
Free Thyroxine Index: 2.4 (ref 1.2–4.9)
T3 Uptake Ratio: 23 % — ABNORMAL LOW (ref 24–39)
T4, Total: 10.4 ug/dL (ref 4.5–12.0)
TSH: 1.32 u[IU]/mL (ref 0.450–4.500)

## 2019-11-08 LAB — CMP14+EGFR
ALT: 32 IU/L (ref 0–32)
AST: 44 IU/L — ABNORMAL HIGH (ref 0–40)
Albumin/Globulin Ratio: 1.7 (ref 1.2–2.2)
Albumin: 3.8 g/dL (ref 3.8–4.8)
Alkaline Phosphatase: 129 IU/L — ABNORMAL HIGH (ref 39–117)
BUN/Creatinine Ratio: 11 — ABNORMAL LOW (ref 12–28)
BUN: 11 mg/dL (ref 8–27)
Bilirubin Total: 0.3 mg/dL (ref 0.0–1.2)
CO2: 24 mmol/L (ref 20–29)
Calcium: 9.5 mg/dL (ref 8.7–10.3)
Chloride: 106 mmol/L (ref 96–106)
Creatinine, Ser: 1.02 mg/dL — ABNORMAL HIGH (ref 0.57–1.00)
GFR calc Af Amer: 64 mL/min/{1.73_m2} (ref >59)
GFR calc non Af Amer: 56 mL/min/{1.73_m2} — ABNORMAL LOW (ref >59)
Globulin, Total: 2.3 g/dL (ref 1.5–4.5)
Glucose: 115 mg/dL — ABNORMAL HIGH (ref 65–99)
Potassium: 4.4 mmol/L (ref 3.5–5.2)
Sodium: 142 mmol/L (ref 134–144)
Total Protein: 6.1 g/dL (ref 6.0–8.5)

## 2019-11-08 NOTE — Telephone Encounter (Signed)
Patient calling stating her CDL was suppose to be sent in. She is calling to make sure you sent it in because it is do on April 6th. Did you send in?

## 2019-11-08 NOTE — Addendum Note (Signed)
Addended by: Bennie Pierini on: 11/08/2019 11:21 AM   Modules accepted: Orders

## 2019-11-12 NOTE — Telephone Encounter (Signed)
Come in tomorrow and pick up copy

## 2019-11-13 NOTE — Telephone Encounter (Signed)
Patient aware.

## 2019-11-18 ENCOUNTER — Telehealth: Payer: Self-pay | Admitting: Nurse Practitioner

## 2019-11-18 NOTE — Telephone Encounter (Signed)
Please review and call patient

## 2019-11-19 NOTE — Telephone Encounter (Signed)
Patient was worried that she didn't sign all of her paperwork. Checked our copy and it appears that she signed everything. Advised patient that if any problem comes up to contact our office

## 2019-11-22 ENCOUNTER — Ambulatory Visit: Payer: BC Managed Care – PPO | Admitting: Nurse Practitioner

## 2019-11-22 ENCOUNTER — Other Ambulatory Visit: Payer: Self-pay

## 2019-11-22 ENCOUNTER — Encounter: Payer: Self-pay | Admitting: Nurse Practitioner

## 2019-11-22 VITALS — BP 111/70 | HR 83 | Temp 98.6°F | Resp 20 | Ht 69.0 in | Wt 169.0 lb

## 2019-11-22 DIAGNOSIS — Z7901 Long term (current) use of anticoagulants: Secondary | ICD-10-CM | POA: Diagnosis not present

## 2019-11-22 DIAGNOSIS — Z86718 Personal history of other venous thrombosis and embolism: Secondary | ICD-10-CM | POA: Diagnosis not present

## 2019-11-22 LAB — COAGUCHEK XS/INR WAIVED
INR: 3.7 — ABNORMAL HIGH (ref 0.9–1.1)
Prothrombin Time: 44.8 s

## 2019-11-22 NOTE — Progress Notes (Signed)
Subjective:   chief complaint: INR recheck   Indication: DVT Bleeding signs/symptoms: None Thromboembolic signs/symptoms: None  Missed Coumadin doses: None Medication changes: no Dietary changes: no Bacterial/viral infection: no Other concerns: no  The following portions of the patient's history were reviewed and updated as appropriate: allergies, current medications, past family history, past medical history, past social history, past surgical history and problem list.  Review of Systems Pertinent items noted in HPI and remainder of comprehensive ROS otherwise negative.   Objective:    INR Today: 3.7 Current dose: coumadin 5mg  daily   BP 111/70   Pulse 83   Temp 98.6 F (37 C) (Temporal)   Resp 20   Ht 5\' 9"  (1.753 m)   Wt 169 lb (76.7 kg)   LMP  (LMP Unknown)   SpO2 99%   BMI 24.96 kg/m   Assessment:    Supratherapeutic INR for goal of 2-3   Plan:    1. New dose: Hold tomorrow then Coumadin 5mg  daily except hold on sundays   2. Next INR: 2 weeks    Mary-Margaret , FNP

## 2019-11-25 ENCOUNTER — Other Ambulatory Visit: Payer: Self-pay

## 2019-11-25 ENCOUNTER — Ambulatory Visit (INDEPENDENT_AMBULATORY_CARE_PROVIDER_SITE_OTHER): Payer: BC Managed Care – PPO | Admitting: Cardiology

## 2019-11-25 ENCOUNTER — Encounter: Payer: Self-pay | Admitting: Cardiology

## 2019-11-25 VITALS — BP 134/66 | HR 84 | Ht 69.0 in | Wt 172.4 lb

## 2019-11-25 DIAGNOSIS — R000 Tachycardia, unspecified: Secondary | ICD-10-CM

## 2019-11-25 MED ORDER — METOPROLOL SUCCINATE ER 25 MG PO TB24: 25.0000 mg | ORAL_TABLET | Freq: Every day | ORAL | Status: DC

## 2019-11-25 NOTE — Patient Instructions (Addendum)
Medication Instructions:   Hold Toprol XL for now.   Continue all other current medications.  Labwork: none  Testing/Procedures:  Your physician has recommended that you wear a 24 hour holter monitor. Holter monitors are medical devices that record the heart's electrical activity. Doctors most often use these monitors to diagnose arrhythmias. Arrhythmias are problems with the speed or rhythm of the heartbeat. The monitor is a small, portable device. You can wear one while you do your normal daily activities. This is usually used to diagnose what is causing palpitations/syncope (passing out).  Office will contact with results via phone or letter.    Follow-Up: Pending test results  Any Other Special Instructions Will Be Listed Below (If Applicable).  If you need a refill on your cardiac medications before your next appointment, please call your pharmacy.

## 2019-11-25 NOTE — Progress Notes (Signed)
Clinical Summary Christina [name] is a 69 y.o.female seen as new consult, referred by NP [facility] for tachycardia  1. Sinus tachycardia - 11/07/19 EKG sinus tach 110 - Hgb 14.2, TSH 1.3, Cr 1.02 BUN 11. Pulse ox 99%/ No chronic severe pain. Regular caffeine use with sodas.  - asymptomatic.  - started on toprol by pcp    Past Medical History:  Diagnosis Date  . Allergic rhinitis   . Clotting disorder ([place])   . DVT (deep venous thrombosis) ([place]) 1990's  . DVT (deep venous thrombosis) ([place]) 1980s   in the leg behind my knee;  right   . Epicondylitis    right   . GERD (gastroesophageal reflux disease)   . Hyperlipidemia   . Hypertension   . Osteoarthritis   . Pulmonary embolism ([facility]) 1990's     Allergies  Allergen Reactions  . Ace Inhibitors Cough          Current Outpatient Medications  Medication Sig Dispense Refill  . amLODipine (NORVASC) 5 MG tablet TAKE 1 TABLET BY MOUTH EVERY DAY (Patient taking differently: Take 5 mg by mouth daily. ) 90 tablet 04  . aspirin 81 MG chewable tablet Chew 1 tablet (81 mg total) by mouth 2 (two) times daily. 30 tablet 0  . diclofenac sodium (VOLTAREN) 1 % GEL Apply 2 g topically 4 (four) times daily. (Patient taking differently: Apply 2 g topically 3 (three) times daily as needed (knee pain). ) 350 g 0  . metoprolol succinate (TOPROL-XL) 25 MG 24 hr tablet Take 1 tablet (25 mg total) by mouth daily. 90 tablet 3  . olmesartan (BENICAR) 40 MG tablet TAKE 1 TABLET BY MOUTH EVERY DAY 30 tablet 2  . pantoprazole (PROTONIX) 40 MG tablet TAKE 1 TABLET BY MOUTH EVERY DAY 90 tablet 0  . warfarin (COUMADIN) 10 MG tablet TAKE 1 TABLET BY MOUTH EVERY DAY (Patient taking differently: Take 5-10 mg by mouth See admin instructions. Take 5 mg by mouth on Monday, Tuesday, Wednesday, Thursday, Saturday and Sunday; on Friday take 10 mg) 90 tablet 1   No current facility-administered medications for this visit.     Past Surgical History:  Procedure  Laterality Date  . COLONOSCOPY  2015  . CYSTECTOMY     From shoulder and neck  . KNEE ARTHROSCOPY Left    Dr Veverly Fells  . SKIN GRAFT     ulcer on right leg   . TOTAL KNEE ARTHROPLASTY Right 08/23/2019   Procedure: TOTAL KNEE ARTHROPLASTY;  Surgeon: Netta Cedars, MD;  Location: [facility];  Service: Orthopedics;  Laterality: Right;  . TUBAL LIGATION     Multiple     Allergies  Allergen Reactions  . Ace Inhibitors Cough           Family History  Problem Relation Age of Onset  . Heart attack Mother   . Deep vein thrombosis Mother   . Diabetes Mother   . Heart disease Mother   . Hyperlipidemia Mother   . Cancer Father   . Hyperlipidemia Sister   . Deep vein thrombosis Daughter   . Other Neg Hx        Clotting disorder     Social History Christina [name] reports that she quit smoking about 40 years ago. She has never used smokeless tobacco. Christina [name] reports no history of alcohol use.   Review of Systems CONSTITUTIONAL: No weight loss, fever, chills, weakness or fatigue.  HEENT: Eyes: No visual loss, blurred vision, double  vision or yellow sclerae.No hearing loss, sneezing, congestion, runny nose or sore throat.  SKIN: No rash or itching.  CARDIOVASCULAR: per hpi RESPIRATORY: No shortness of breath, cough or sputum.  GASTROINTESTINAL: No anorexia, nausea, vomiting or diarrhea. No abdominal pain or blood.  GENITOURINARY: No burning on urination, no polyuria NEUROLOGICAL: No headache, dizziness, syncope, paralysis, ataxia, numbness or tingling in the extremities. No change in bowel or bladder control.  MUSCULOSKELETAL: No muscle, back pain, joint pain or stiffness.  LYMPHATICS: No enlarged nodes. No history of splenectomy.  PSYCHIATRIC: No history of depression or anxiety.  ENDOCRINOLOGIC: No reports of sweating, cold or heat intolerance. No polyuria or polydipsia.  Marland Kitchen   Physical Examination Today's Vitals   11/25/19 1442  BP: 134/66  Pulse: 84  SpO2: 99%  Weight: 172 lb  6.4 oz (78.2 kg)  Height: 5\' 9"  (1.753 m)   Body mass index is 25.46 kg/m.  Gen: resting comfortably, no acute distress HEENT: no scleral icterus, pupils equal round and reactive, no palptable cervical adenopathy,  CV: RRR, no m/r/g, no jvd Resp: Clear to auscultation bilaterally GI: abdomen is soft, non-tender, non-distended, normal bowel sounds, no hepatosplenomegaly MSK: extremities are warm, no edema.  Skin: warm, no rash Neuro:  no focal deficits Psych: appropriate affect     Assessment and Plan  1. Sinus tachycardia - unclear etiology, no underlying symptoms to suggest possible etiology. Initial workup has been benign - stop toprol, do not see indication for mild sinus tach - after toprol has washed out plan for 24 hr holter to better evaluate HR trends and rhythm. If benign monitor then no further workup.        , M.D.

## 2019-12-04 ENCOUNTER — Encounter: Payer: Self-pay | Admitting: Nurse Practitioner

## 2019-12-04 ENCOUNTER — Ambulatory Visit (INDEPENDENT_AMBULATORY_CARE_PROVIDER_SITE_OTHER): Payer: BC Managed Care – PPO | Admitting: Nurse Practitioner

## 2019-12-04 ENCOUNTER — Other Ambulatory Visit: Payer: Self-pay

## 2019-12-04 VITALS — BP 126/78 | HR 101 | Temp 97.0°F | Wt 168.0 lb

## 2019-12-04 DIAGNOSIS — Z86718 Personal history of other venous thrombosis and embolism: Secondary | ICD-10-CM

## 2019-12-04 DIAGNOSIS — Z7901 Long term (current) use of anticoagulants: Secondary | ICD-10-CM | POA: Diagnosis not present

## 2019-12-04 LAB — COAGUCHEK XS/INR WAIVED
INR: 3.6 — ABNORMAL HIGH (ref 0.9–1.1)
Prothrombin Time: 43.8 s

## 2019-12-04 NOTE — Progress Notes (Signed)
Subjective:   chief Complaint; INR recheck   Indication: DVT Bleeding signs/symptoms: None Thromboembolic signs/symptoms: None  Missed Coumadin doses: she took extra pill on Sunday and she was not suppose to take one at all.  Medication changes: no Dietary changes: no Bacterial/viral infection: no Other concerns: no  The following portions of the patient's history were reviewed and updated as appropriate: allergies, current medications, past family history, past medical history, past social history, past surgical history and problem list.  Review of Systems Pertinent items noted in HPI and remainder of comprehensive ROS otherwise negative.   Objective:    INR Today: 3.6 Current dose: coumadin 5mg  daily except nothing on sundays    Assessment:    Supratherapeutic INR for goal of 2-3   Plan:    1. New dose: hold tomorrow then back on 5mg  daiy except nothing on sundays   2. Next INR: 2 weeks    Mary-Margaret , FNP

## 2019-12-05 ENCOUNTER — Ambulatory Visit: Payer: BC Managed Care – PPO | Attending: Internal Medicine

## 2019-12-05 DIAGNOSIS — Z23 Encounter for immunization: Secondary | ICD-10-CM

## 2019-12-05 NOTE — Progress Notes (Signed)
   Covid-19 Vaccination Clinic  Name:  Stephannie Broner    MRN: [id number] DOB: 05-16-1951  12/05/2019  Ms. Alvis was observed post Covid-19 immunization for 15 minutes without incident. She was provided with Vaccine Information Sheet and instruction to access the V-Safe system.   Ms. Harkins was instructed to call 911 with any severe reactions post vaccine: Marland Kitchen Difficulty breathing  . Swelling of face and throat  . A fast heartbeat  . A bad rash all over body  . Dizziness and weakness   Immunizations Administered    Name Date Dose VIS Date Route   Moderna COVID-19 Vaccine 12/05/2019 11:11 AM 0.5 mL 07/2019 Intramuscular   Manufacturer: Gala Murdoch   Lot: [id number]   NDC: 73567-014-10

## 2019-12-10 ENCOUNTER — Other Ambulatory Visit (INDEPENDENT_AMBULATORY_CARE_PROVIDER_SITE_OTHER): Payer: Self-pay | Admitting: *Deleted

## 2019-12-10 DIAGNOSIS — R945 Abnormal results of liver function studies: Secondary | ICD-10-CM

## 2019-12-10 DIAGNOSIS — R7989 Other specified abnormal findings of blood chemistry: Secondary | ICD-10-CM

## 2019-12-14 ENCOUNTER — Other Ambulatory Visit: Payer: Self-pay | Admitting: Nurse Practitioner

## 2019-12-14 DIAGNOSIS — Z7901 Long term (current) use of anticoagulants: Secondary | ICD-10-CM

## 2019-12-16 ENCOUNTER — Other Ambulatory Visit (INDEPENDENT_AMBULATORY_CARE_PROVIDER_SITE_OTHER): Payer: Self-pay | Admitting: *Deleted

## 2019-12-16 DIAGNOSIS — K760 Fatty (change of) liver, not elsewhere classified: Secondary | ICD-10-CM

## 2019-12-16 DIAGNOSIS — R945 Abnormal results of liver function studies: Secondary | ICD-10-CM

## 2019-12-16 DIAGNOSIS — R7989 Other specified abnormal findings of blood chemistry: Secondary | ICD-10-CM

## 2019-12-16 LAB — HEPATIC FUNCTION PANEL
AG Ratio: 1.6 (calc) (ref 1.0–2.5)
ALT: 29 U/L (ref 6–29)
AST: 47 U/L — ABNORMAL HIGH (ref 10–35)
Albumin: 3.3 g/dL — ABNORMAL LOW (ref 3.6–5.1)
Alkaline phosphatase (APISO): 102 U/L (ref 37–153)
Bilirubin, Direct: 0.1 mg/dL (ref 0.0–0.2)
Globulin: 2.1 g/dL (calc) (ref 1.9–3.7)
Indirect Bilirubin: 0.4 mg/dL (calc) (ref 0.2–1.2)
Total Bilirubin: 0.5 mg/dL (ref 0.2–1.2)
Total Protein: 5.4 g/dL — ABNORMAL LOW (ref 6.1–8.1)

## 2019-12-18 ENCOUNTER — Other Ambulatory Visit (INDEPENDENT_AMBULATORY_CARE_PROVIDER_SITE_OTHER): Payer: BC Managed Care – PPO

## 2019-12-18 DIAGNOSIS — R000 Tachycardia, unspecified: Secondary | ICD-10-CM | POA: Diagnosis not present

## 2019-12-19 ENCOUNTER — Other Ambulatory Visit: Payer: Self-pay

## 2019-12-19 ENCOUNTER — Ambulatory Visit: Payer: BC Managed Care – PPO | Admitting: Nurse Practitioner

## 2019-12-19 ENCOUNTER — Encounter: Payer: Self-pay | Admitting: Nurse Practitioner

## 2019-12-19 DIAGNOSIS — Z86718 Personal history of other venous thrombosis and embolism: Secondary | ICD-10-CM

## 2019-12-19 DIAGNOSIS — Z7901 Long term (current) use of anticoagulants: Secondary | ICD-10-CM | POA: Diagnosis not present

## 2019-12-19 LAB — COAGUCHEK XS/INR WAIVED
INR: 2.5 — ABNORMAL HIGH (ref 0.9–1.1)
Prothrombin Time: 30.3 s

## 2019-12-19 NOTE — Progress Notes (Signed)
Subjective:   Chief Complaint: INR recheck   Indication: DVT Bleeding signs/symptoms: None Thromboembolic signs/symptoms: None  Missed Coumadin doses: None Medication changes: no Dietary changes: no Bacterial/viral infection: no Other concerns: no  The following portions of the patient's history were reviewed and updated as appropriate: allergies, current medications, past family history, past medical history, past social history, past surgical history and problem list.  Review of Systems Pertinent items noted in HPI and remainder of comprehensive ROS otherwise negative.   Objective:    INR Today: 2.5 Current dose: coumadin 5mg  daily    Assessment:    Therapeutic INR for goal of 2-3   Plan:    1. New dose: no change   2. Next INR: 1 month    Mary-Margaret , FNP

## 2019-12-19 NOTE — Addendum Note (Signed)
Addended by: Bennie Pierini on: 12/19/2019 02:23 PM   Modules accepted: Orders

## 2019-12-25 ENCOUNTER — Encounter: Payer: Self-pay | Admitting: Family Medicine

## 2019-12-25 ENCOUNTER — Ambulatory Visit (INDEPENDENT_AMBULATORY_CARE_PROVIDER_SITE_OTHER): Payer: BC Managed Care – PPO | Admitting: Family Medicine

## 2019-12-25 DIAGNOSIS — A084 Viral intestinal infection, unspecified: Secondary | ICD-10-CM

## 2019-12-25 NOTE — Progress Notes (Signed)
Virtual Visit via telephone Note  I connected with Christina [name] on 12/25/19 at 1034 by telephone and verified that I am speaking with the correct person using two identifiers. Christina [name] is currently located at home and no other people are currently with her during visit. The provider, Elige Radon Modesty Rudy, MD is located in their office at time of visit.  Call ended at 1340  I discussed the limitations, risks, security and privacy concerns of performing an evaluation and management service by telephone and the availability of in person appointments. I also discussed with the patient that there may be a patient responsible charge related to this service. The patient expressed understanding and agreed to proceed.   History and Present Illness: Patient is calling in for diarrhea and vomiting and woke up like that this 2 days ago.  She is still having diarrhea but vomiting is better.  She denies any fevers or chills.  She had sweats Sunday but denies blood in stool. She denies any sick contacts.  She is trying to increase fluids. Her stomach hurt on Sunday but is gone.    1. Viral gastroenteritis     Outpatient Encounter Medications as of 12/25/2019  Medication Sig  . amLODipine (NORVASC) 5 MG tablet TAKE 1 TABLET BY MOUTH EVERY DAY (Patient taking differently: Take 5 mg by mouth daily. )  . diclofenac sodium (VOLTAREN) 1 % GEL Apply 2 g topically 4 (four) times daily. (Patient taking differently: Apply 2 g topically 3 (three) times daily as needed (knee pain). )  . metoprolol succinate (TOPROL-XL) 25 MG 24 hr tablet Take 1 tablet (25 mg total) by mouth daily. (11/25/2019 - HOLDING FOR NOW)  . olmesartan (BENICAR) 40 MG tablet TAKE 1 TABLET BY MOUTH EVERY DAY  . pantoprazole (PROTONIX) 40 MG tablet TAKE 1 TABLET BY MOUTH EVERY DAY  . warfarin (COUMADIN) 10 MG tablet TAKE 1 TABLET BY MOUTH EVERY DAY   No facility-administered encounter medications on file as of 12/25/2019.    Review of  Systems  Constitutional: Negative for chills and fever.  Eyes: Negative for visual disturbance.  Respiratory: Negative for chest tightness and shortness of breath.   Cardiovascular: Negative for chest pain and leg swelling.  Gastrointestinal: Positive for abdominal pain, diarrhea, nausea and vomiting.  Genitourinary: Negative for difficulty urinating, dysuria and frequency.  Musculoskeletal: Negative for back pain and gait problem.  Skin: Negative for rash.  Neurological: Negative for light-headedness and headaches.  Psychiatric/Behavioral: Negative for agitation and behavioral problems.  All other systems reviewed and are negative.   Observations/Objective: Patient sounds comfortable and in no acute distress  Assessment and Plan: Problem List Items Addressed This Visit    None    Visit Diagnoses    Viral gastroenteritis    -  Primary    eat BRAT diet and stay hydrated   Follow up plan: Return if symptoms worsen or fail to improve.     I discussed the assessment and treatment plan with the patient. The patient was provided an opportunity to ask questions and all were answered. The patient agreed with the plan and demonstrated an understanding of the instructions.   The patient was advised to call back or seek an in-person evaluation if the symptoms worsen or if the condition fails to improve as anticipated.  The above assessment and management plan was discussed with the patient. The patient verbalized understanding of and has agreed to the management plan. Patient is aware to call the clinic if  symptoms persist or worsen. Patient is aware when to return to the clinic for a follow-up visit. Patient educated on when it is appropriate to go to the emergency department.    I provided 6 minutes of non-face-to-face time during this encounter.    Worthy Rancher, MD

## 2019-12-27 ENCOUNTER — Telehealth: Payer: Self-pay | Admitting: Nurse Practitioner

## 2019-12-27 NOTE — Telephone Encounter (Signed)
Advised patient to try Immodium AD and to contact the office if no improvement. Patient agreed and verbalized understanding

## 2019-12-30 ENCOUNTER — Other Ambulatory Visit: Payer: Self-pay | Admitting: Nurse Practitioner

## 2019-12-30 DIAGNOSIS — I10 Essential (primary) hypertension: Secondary | ICD-10-CM

## 2020-01-07 ENCOUNTER — Encounter: Payer: Self-pay | Admitting: Nurse Practitioner

## 2020-01-07 ENCOUNTER — Ambulatory Visit (INDEPENDENT_AMBULATORY_CARE_PROVIDER_SITE_OTHER): Payer: BC Managed Care – PPO | Admitting: Nurse Practitioner

## 2020-01-07 ENCOUNTER — Telehealth: Payer: Self-pay | Admitting: Nurse Practitioner

## 2020-01-07 DIAGNOSIS — R1033 Periumbilical pain: Secondary | ICD-10-CM

## 2020-01-07 DIAGNOSIS — R112 Nausea with vomiting, unspecified: Secondary | ICD-10-CM

## 2020-01-07 DIAGNOSIS — R634 Abnormal weight loss: Secondary | ICD-10-CM | POA: Diagnosis not present

## 2020-01-07 NOTE — Telephone Encounter (Signed)
Patient given appointment for today

## 2020-01-07 NOTE — Progress Notes (Signed)
Virtual Visit via telephone Note Due to COVID-19 pandemic this visit was conducted virtually. This visit type was conducted due to national recommendations for restrictions regarding the COVID-19 Pandemic (e.g. social distancing, sheltering in place) in an effort to limit this patient's exposure and mitigate transmission in our community. All issues noted in this document were discussed and addressed.  A physical exam was not performed with this format.  I connected with Christina [name] on 01/07/20 at 2:15 by telephone and verified that I am speaking with the correct person using two identifiers. Christina [name] is currently located at home and no one is currently with her during visit. The provider, Mary-Margaret Daphine Deutscher, FNP is located in their office at time of visit.  I discussed the limitations, risks, security and privacy concerns of performing an evaluation and management service by telephone and the availability of in person appointments. I also discussed with the patient that there may be a patient responsible charge related to this service. The patient expressed understanding and agreed to proceed.   History and Present Illness:   Chief Complaint: abdominal pain and vomiting  HPI Patient call in c/o abdominal pain and nausea. SHe is scheduled for gastrocopy in the futre and has to see GI next week to schedule that. She ays she has lost 5 ore lb since last visit. She  has had frequent vomiting since mothers day. She says that food does not tate normal and she frequently throws up her food after eating. He  Has had ome pink tinge on toilet paper only when she voids. Right now she is having abdominal pain at her naval. Rate pain 3-4/10. [name] been hurting off and on for 2-3 day. She has lost 40lb in the last year without trying. The adominal pain jut recently started.she is also either vomiting or having diarrhea.  Review of Systems  Constitutional: Negative for diaphoresis and weight loss.    Eyes: Negative for blurred vision, double vision and pain.  Respiratory: Negative for shortness of breath.   Cardiovascular: Negative for chest pain, palpitations, orthopnea and leg swelling.  Gastrointestinal: Positive for abdominal pain, diarrhea, nausea and vomiting.  Genitourinary: Positive for hematuria.  Musculoskeletal: Positive for back pain.  Skin: Negative for rash.  Neurological: Negative for dizziness, sensory change, loss of consciousness, weakness and headaches.  Endo/Heme/Allergies: Negative for polydipsia. Does not bruise/bleed easily.  Psychiatric/Behavioral: Negative for memory loss. The patient does not have insomnia.   All other systems reviewed and are negative.    Observations/Objective: Alert and oriented- answers all questions appropriately Mild  Distress decribes periumbilical pain    Assessment and Plan: Christina [name] in today with chief complaint of No chief complaint on file.   1. Periumbilical pain - CT Abdomen Pelvis W Contrast; Future  2. Non-intractable vomiting with nausea, unspecified vomiting type - CT Abdomen Pelvis W Contrast; Future  3. Weight loss - CT Abdomen Pelvis W Contrast; Future     Follow Up Instructions: prn    I discussed the assessment and treatment plan with the patient. The patient was provided an opportunity to ask questions and all were answered. The patient agreed with the plan and demonstrated an understanding of the instructions.   The patient was advised to call back or seek an in-person evaluation if the symptoms worsen or if the condition fails to improve as anticipated.  The above assessment and management plan was discussed with the patient. The patient verbalized understanding of and has agreed to the management plan.  Patient is aware to call the clinic if symptoms persist or worsen. Patient is aware when to return to the clinic for a follow-up visit. Patient educated on when it is appropriate to go to the  emergency department.   Time call ended:  2:30  I provided 15 minutes of non-face-to-face time during this encounter.    Mary-Margaret Hassell Done, FNP

## 2020-01-07 NOTE — Telephone Encounter (Signed)
Pt says she recently had a televisit with Dr Museum/gallery exhibitions officer for possible stomach bug and then had saw Dr Mora Appl and has some tests ran, which all came back normal. Pt says she is still having stomach issues and cant eat anything because when she does, she vomits. Pt says it makes her nauseous to even smell food. Wants advice from MMM.

## 2020-01-08 ENCOUNTER — Ambulatory Visit (HOSPITAL_COMMUNITY)
Admission: RE | Admit: 2020-01-08 | Discharge: 2020-01-08 | Disposition: A | Discharge status: Home / Self Care | Payer: BC Managed Care – PPO | Source: Ambulatory Visit | Attending: Nurse Practitioner | Admitting: Nurse Practitioner

## 2020-01-08 ENCOUNTER — Other Ambulatory Visit: Payer: Self-pay

## 2020-01-08 DIAGNOSIS — R112 Nausea with vomiting, unspecified: Secondary | ICD-10-CM | POA: Insufficient documentation

## 2020-01-08 DIAGNOSIS — R1033 Periumbilical pain: Secondary | ICD-10-CM

## 2020-01-08 DIAGNOSIS — R634 Abnormal weight loss: Secondary | ICD-10-CM | POA: Diagnosis present

## 2020-01-08 LAB — POCT I-STAT CREATININE: Creatinine, Ser: 0.9 mg/dL (ref 0.44–1.00)

## 2020-01-08 MED ORDER — IOHEXOL 300 MG/ML  SOLN
100.0000 mL | Freq: Once | INTRAMUSCULAR | Status: AC | PRN
  Administered 2020-01-08: 100 mL via INTRAVENOUS

## 2020-01-10 ENCOUNTER — Telehealth: Payer: Self-pay | Admitting: *Deleted

## 2020-01-10 NOTE — Telephone Encounter (Signed)
-----   Message from Antoine Poche, MD sent at 01/07/2020 10:09 AM EDT ----- Normal heart monitor. Her average HR is 85 which is considered normal. No further workup is indicated, f/u as needed  J BrancH MD

## 2020-01-14 MED ORDER — SODIUM CHLORIDE 0.9 % IV SOLN
100.00 | INTRAVENOUS | Status: DC
Start: ? — End: 2020-01-14

## 2020-01-14 MED ORDER — ALBUTEROL SULFATE (2.5 MG/3ML) 0.083% IN NEBU
2.50 | INHALATION_SOLUTION | RESPIRATORY_TRACT | Status: DC
Start: ? — End: 2020-01-14

## 2020-01-14 MED ORDER — METOPROLOL SUCCINATE ER 25 MG PO TB24
25.00 | ORAL_TABLET | ORAL | Status: DC
Start: 2020-01-17 — End: 2020-01-14

## 2020-01-14 MED ORDER — NALOXONE HCL 4 MG/10ML IJ SOLN
0.10 | INTRAMUSCULAR | Status: DC
Start: ? — End: 2020-01-14

## 2020-01-14 MED ORDER — CIPROFLOXACIN IN D5W 400 MG/200ML IV SOLN
400.00 | INTRAVENOUS | Status: DC
Start: 2020-01-14 — End: 2020-01-14

## 2020-01-14 MED ORDER — SODIUM CHLORIDE 0.9 % IV SOLN
INTRAVENOUS | Status: DC
Start: ? — End: 2020-01-14

## 2020-01-14 MED ORDER — ONDANSETRON HCL 4 MG/2ML IJ SOLN
4.00 | INTRAMUSCULAR | Status: DC
Start: ? — End: 2020-01-14

## 2020-01-14 MED ORDER — CALCIUM CARBONATE 1250 (500 CA) MG PO CHEW
CHEWABLE_TABLET | ORAL | Status: DC
Start: ? — End: 2020-01-14

## 2020-01-14 MED ORDER — LOSARTAN POTASSIUM 100 MG PO TABS
100.00 | ORAL_TABLET | ORAL | Status: DC
Start: 2020-01-17 — End: 2020-01-14

## 2020-01-14 MED ORDER — MORPHINE SULFATE 10 MG/ML IJ SOLN
2.00 | INTRAMUSCULAR | Status: DC
Start: ? — End: 2020-01-14

## 2020-01-14 MED ORDER — GUAIFENESIN 100 MG/5ML PO SYRP
200.00 | ORAL_SOLUTION | ORAL | Status: DC
Start: ? — End: 2020-01-14

## 2020-01-14 MED ORDER — METRONIDAZOLE IN NACL 5-0.79 MG/ML-% IV SOLN
500.00 | INTRAVENOUS | Status: DC
Start: 2020-01-14 — End: 2020-01-14

## 2020-01-14 MED ORDER — PANTOPRAZOLE SODIUM 40 MG PO TBEC
40.00 | DELAYED_RELEASE_TABLET | ORAL | Status: DC
Start: 2020-01-17 — End: 2020-01-14

## 2020-01-14 MED ORDER — AMLODIPINE BESYLATE 5 MG PO TABS
5.00 | ORAL_TABLET | ORAL | Status: DC
Start: 2020-01-17 — End: 2020-01-14

## 2020-01-14 MED ORDER — NICOTINE 21 MG/24HR TD PT24
1.00 | MEDICATED_PATCH | TRANSDERMAL | Status: DC
Start: ? — End: 2020-01-14

## 2020-01-16 ENCOUNTER — Ambulatory Visit: Payer: Self-pay | Admitting: Nurse Practitioner

## 2020-01-16 MED ORDER — CIPROFLOXACIN HCL 500 MG PO TABS
500.00 | ORAL_TABLET | ORAL | Status: DC
Start: 2020-01-16 — End: 2020-01-16

## 2020-01-16 MED ORDER — METRONIDAZOLE 250 MG PO TABS
500.00 | ORAL_TABLET | ORAL | Status: DC
Start: 2020-01-16 — End: 2020-01-16

## 2020-01-16 MED ORDER — HYDROCODONE-ACETAMINOPHEN 5-325 MG PO TABS
1.00 | ORAL_TABLET | ORAL | Status: DC
Start: ? — End: 2020-01-16

## 2020-01-18 ENCOUNTER — Other Ambulatory Visit: Payer: Self-pay | Admitting: Nurse Practitioner

## 2020-01-18 DIAGNOSIS — K219 Gastro-esophageal reflux disease without esophagitis: Secondary | ICD-10-CM

## 2020-01-20 MED ORDER — MORPHINE SULFATE 10 MG/ML IJ SOLN
2.00 | INTRAMUSCULAR | Status: DC
Start: ? — End: 2020-01-20

## 2020-01-20 MED ORDER — CIPROFLOXACIN IN D5W 400 MG/200ML IV SOLN
400.00 | INTRAVENOUS | Status: DC
Start: 2020-01-20 — End: 2020-01-20

## 2020-01-20 MED ORDER — KCL IN DEXTROSE-NACL 40-5-0.9 MEQ/L-%-% IV SOLN
125.00 | INTRAVENOUS | Status: DC
Start: ? — End: 2020-01-20

## 2020-01-20 MED ORDER — ACETAMINOPHEN 325 MG PO TABS
650.00 | ORAL_TABLET | ORAL | Status: DC
Start: ? — End: 2020-01-20

## 2020-01-20 MED ORDER — MAGNESIUM OXIDE 400 MG PO TABS
800.00 | ORAL_TABLET | ORAL | Status: DC
Start: 2020-01-20 — End: 2020-01-20

## 2020-01-20 MED ORDER — METRONIDAZOLE IN NACL 5-0.79 MG/ML-% IV SOLN
500.00 | INTRAVENOUS | Status: DC
Start: 2020-01-20 — End: 2020-01-20

## 2020-01-20 MED ORDER — POTASSIUM CHLORIDE CRYS ER 20 MEQ PO TBCR
20.00 | EXTENDED_RELEASE_TABLET | ORAL | Status: DC
Start: 2020-01-20 — End: 2020-01-20

## 2020-01-20 MED ORDER — CALCIUM CARBONATE 1250 (500 CA) MG PO CHEW
CHEWABLE_TABLET | ORAL | Status: DC
Start: ? — End: 2020-01-20

## 2020-01-20 MED ORDER — ONDANSETRON HCL 4 MG/2ML IJ SOLN
4.00 | INTRAMUSCULAR | Status: DC
Start: ? — End: 2020-01-20

## 2020-01-20 MED ORDER — LOPERAMIDE HCL 2 MG PO CAPS
2.00 | ORAL_CAPSULE | ORAL | Status: DC
Start: ? — End: 2020-01-20

## 2020-01-22 ENCOUNTER — Telehealth: Payer: Self-pay | Admitting: Nurse Practitioner

## 2020-01-22 NOTE — Telephone Encounter (Signed)
°  REFERRAL REQUEST Telephone Note 01/22/2020  What type of referral do you need? gastroenterologist  Have you been seen at our office for this problem? Pt was seen at [facility] for this and they want her to have appt (Advise that they may need an appointment with their PCP before a referral can be done)  Is there a particular doctor or location that you prefer? [place]  Patient notified that referrals can take up to a week or longer to process. If they haven't heard anything within a week they should call back and speak with the referral department.

## 2020-01-22 NOTE — Telephone Encounter (Signed)
Patient has a follow up appointment scheduled for 01-27-20  with pcp.    Can referral be done now or wait?

## 2020-01-23 ENCOUNTER — Encounter (HOSPITAL_COMMUNITY): Payer: Self-pay | Admitting: *Deleted

## 2020-01-23 ENCOUNTER — Other Ambulatory Visit: Payer: Self-pay

## 2020-01-23 ENCOUNTER — Inpatient Hospital Stay (HOSPITAL_COMMUNITY)
Admission: AD | Admit: 2020-01-23 | Discharge: 2020-01-28 | DRG: 353 | Disposition: A | Discharge status: Home / Self Care | Payer: BC Managed Care – PPO | Attending: Family Medicine | Admitting: Family Medicine

## 2020-01-23 DIAGNOSIS — R634 Abnormal weight loss: Secondary | ICD-10-CM | POA: Diagnosis present

## 2020-01-23 DIAGNOSIS — Z83438 Family history of other disorder of lipoprotein metabolism and other lipidemia: Secondary | ICD-10-CM

## 2020-01-23 DIAGNOSIS — K436 Other and unspecified ventral hernia with obstruction, without gangrene: Principal | ICD-10-CM | POA: Diagnosis present

## 2020-01-23 DIAGNOSIS — Z833 Family history of diabetes mellitus: Secondary | ICD-10-CM

## 2020-01-23 DIAGNOSIS — E860 Dehydration: Secondary | ICD-10-CM | POA: Diagnosis present

## 2020-01-23 DIAGNOSIS — K219 Gastro-esophageal reflux disease without esophagitis: Secondary | ICD-10-CM | POA: Diagnosis present

## 2020-01-23 DIAGNOSIS — Z888 Allergy status to other drugs, medicaments and biological substances status: Secondary | ICD-10-CM

## 2020-01-23 DIAGNOSIS — E785 Hyperlipidemia, unspecified: Secondary | ICD-10-CM | POA: Diagnosis present

## 2020-01-23 DIAGNOSIS — M545 Low back pain, unspecified: Secondary | ICD-10-CM

## 2020-01-23 DIAGNOSIS — I959 Hypotension, unspecified: Secondary | ICD-10-CM | POA: Diagnosis present

## 2020-01-23 DIAGNOSIS — Z8249 Family history of ischemic heart disease and other diseases of the circulatory system: Secondary | ICD-10-CM

## 2020-01-23 DIAGNOSIS — E43 Unspecified severe protein-calorie malnutrition: Secondary | ICD-10-CM | POA: Diagnosis present

## 2020-01-23 DIAGNOSIS — I10 Essential (primary) hypertension: Secondary | ICD-10-CM | POA: Diagnosis not present

## 2020-01-23 DIAGNOSIS — Z96653 Presence of artificial knee joint, bilateral: Secondary | ICD-10-CM | POA: Diagnosis present

## 2020-01-23 DIAGNOSIS — Z20822 Contact with and (suspected) exposure to covid-19: Secondary | ICD-10-CM | POA: Diagnosis present

## 2020-01-23 DIAGNOSIS — Z808 Family history of malignant neoplasm of other organs or systems: Secondary | ICD-10-CM

## 2020-01-23 DIAGNOSIS — R64 Cachexia: Secondary | ICD-10-CM | POA: Diagnosis present

## 2020-01-23 DIAGNOSIS — Z7901 Long term (current) use of anticoagulants: Secondary | ICD-10-CM

## 2020-01-23 DIAGNOSIS — Z86711 Personal history of pulmonary embolism: Secondary | ICD-10-CM

## 2020-01-23 DIAGNOSIS — Z8052 Family history of malignant neoplasm of bladder: Secondary | ICD-10-CM

## 2020-01-23 DIAGNOSIS — R627 Adult failure to thrive: Secondary | ICD-10-CM | POA: Diagnosis present

## 2020-01-23 DIAGNOSIS — Z87891 Personal history of nicotine dependence: Secondary | ICD-10-CM

## 2020-01-23 DIAGNOSIS — Z86718 Personal history of other venous thrombosis and embolism: Secondary | ICD-10-CM

## 2020-01-23 DIAGNOSIS — K760 Fatty (change of) liver, not elsewhere classified: Secondary | ICD-10-CM | POA: Diagnosis present

## 2020-01-23 DIAGNOSIS — R112 Nausea with vomiting, unspecified: Secondary | ICD-10-CM

## 2020-01-23 DIAGNOSIS — Z6821 Body mass index (BMI) 21.0-21.9, adult: Secondary | ICD-10-CM

## 2020-01-23 DIAGNOSIS — D638 Anemia in other chronic diseases classified elsewhere: Secondary | ICD-10-CM | POA: Diagnosis present

## 2020-01-23 DIAGNOSIS — E538 Deficiency of other specified B group vitamins: Secondary | ICD-10-CM | POA: Diagnosis present

## 2020-01-23 LAB — CBC WITH DIFFERENTIAL/PLATELET
Abs Immature Granulocytes: 0.16 10*3/uL — ABNORMAL HIGH (ref 0.00–0.07)
Basophils Absolute: 0 10*3/uL (ref 0.0–0.1)
Basophils Relative: 0 %
Eosinophils Absolute: 0 10*3/uL (ref 0.0–0.5)
Eosinophils Relative: 0 %
HCT: 33.1 % — ABNORMAL LOW (ref 36.0–46.0)
Hemoglobin: 10.9 g/dL — ABNORMAL LOW (ref 12.0–15.0)
Immature Granulocytes: 1 %
Lymphocytes Relative: 5 %
Lymphs Abs: 0.6 10*3/uL — ABNORMAL LOW (ref 0.7–4.0)
MCH: 26.7 pg (ref 26.0–34.0)
MCHC: 32.9 g/dL (ref 30.0–36.0)
MCV: 81.1 fL (ref 80.0–100.0)
Monocytes Absolute: 0.8 10*3/uL (ref 0.1–1.0)
Monocytes Relative: 6 %
Neutro Abs: 10.7 10*3/uL — ABNORMAL HIGH (ref 1.7–7.7)
Neutrophils Relative %: 88 %
Platelets: 250 10*3/uL (ref 150–400)
RBC: 4.08 MIL/uL (ref 3.87–5.11)
RDW: 16.3 % — ABNORMAL HIGH (ref 11.5–15.5)
WBC: 12.2 10*3/uL — ABNORMAL HIGH (ref 4.0–10.5)
nRBC: 0 % (ref 0.0–0.2)

## 2020-01-23 LAB — COMPREHENSIVE METABOLIC PANEL
ALT: 13 U/L (ref 0–44)
AST: 23 U/L (ref 15–41)
Albumin: 2.9 g/dL — ABNORMAL LOW (ref 3.5–5.0)
Alkaline Phosphatase: 61 U/L (ref 38–126)
Anion gap: 12 (ref 5–15)
BUN: 8 mg/dL (ref 8–23)
CO2: 22 mmol/L (ref 22–32)
Calcium: 9.2 mg/dL (ref 8.9–10.3)
Chloride: 104 mmol/L (ref 98–111)
Creatinine, Ser: 0.77 mg/dL (ref 0.44–1.00)
GFR calc Af Amer: >60 mL/min (ref >60)
GFR calc non Af Amer: >60 mL/min (ref >60)
Glucose, Bld: 109 mg/dL — ABNORMAL HIGH (ref 70–99)
Potassium: 4.3 mmol/L (ref 3.5–5.1)
Sodium: 138 mmol/L (ref 135–145)
Total Bilirubin: 1.6 mg/dL — ABNORMAL HIGH (ref 0.3–1.2)
Total Protein: 5.1 g/dL — ABNORMAL LOW (ref 6.5–8.1)

## 2020-01-23 LAB — URINALYSIS, ROUTINE W REFLEX MICROSCOPIC
Bilirubin Urine: NEGATIVE
Glucose, UA: NEGATIVE mg/dL
Ketones, ur: 5 mg/dL — AB
Nitrite: NEGATIVE
Protein, ur: NEGATIVE mg/dL
Specific Gravity, Urine: 1.011 (ref 1.005–1.030)
pH: 6 (ref 5.0–8.0)

## 2020-01-23 LAB — LACTIC ACID, PLASMA: Lactic Acid, Venous: 1.6 mmol/L (ref 0.5–1.9)

## 2020-01-23 LAB — LIPASE, BLOOD: Lipase: 120 U/L — ABNORMAL HIGH (ref 11–51)

## 2020-01-23 LAB — TSH: TSH: 2.986 u[IU]/mL (ref 0.350–4.500)

## 2020-01-23 LAB — SARS CORONAVIRUS 2 BY RT PCR (HOSPITAL ORDER, PERFORMED IN ~~LOC~~ HOSPITAL LAB): SARS Coronavirus 2: NEGATIVE

## 2020-01-23 MED ORDER — ONDANSETRON HCL 4 MG/2ML IJ SOLN
4.0000 mg | Freq: Once | INTRAMUSCULAR | Status: AC
  Administered 2020-01-23: 4 mg via INTRAVENOUS
  Filled 2020-01-23: qty 2

## 2020-01-23 MED ORDER — APIXABAN 5 MG PO TABS
5.0000 mg | ORAL_TABLET | Freq: Two times a day (BID) | ORAL | Status: DC
  Administered 2020-01-23 – 2020-01-24 (×3): 5 mg via ORAL
  Filled 2020-01-23 (×3): qty 1

## 2020-01-23 MED ORDER — LACTATED RINGERS IV SOLN: INTRAVENOUS | Status: AC

## 2020-01-23 MED ORDER — ACETAMINOPHEN 650 MG RE SUPP: 650.0000 mg | Freq: Four times a day (QID) | RECTAL | Status: DC | PRN

## 2020-01-23 MED ORDER — ACETAMINOPHEN 325 MG PO TABS: 650.0000 mg | ORAL_TABLET | Freq: Four times a day (QID) | ORAL | Status: DC | PRN

## 2020-01-23 MED ORDER — PANTOPRAZOLE SODIUM 40 MG PO TBEC
40.0000 mg | DELAYED_RELEASE_TABLET | Freq: Every day | ORAL | Status: DC
  Administered 2020-01-23 – 2020-01-28 (×5): 40 mg via ORAL
  Filled 2020-01-23 (×6): qty 1

## 2020-01-23 MED ORDER — DICLOFENAC SODIUM 1 % TD GEL
2.0000 g | Freq: Three times a day (TID) | TRANSDERMAL | Status: DC | PRN
  Filled 2020-01-23: qty 100

## 2020-01-23 MED ORDER — LOPERAMIDE HCL 2 MG PO CAPS: 2.0000 mg | ORAL_CAPSULE | Freq: Four times a day (QID) | ORAL | Status: DC | PRN

## 2020-01-23 MED ORDER — ONDANSETRON HCL 4 MG PO TABS: 4.0000 mg | ORAL_TABLET | Freq: Four times a day (QID) | ORAL | Status: DC | PRN

## 2020-01-23 MED ORDER — ONDANSETRON HCL 4 MG/2ML IJ SOLN: 4.0000 mg | Freq: Four times a day (QID) | INTRAMUSCULAR | Status: DC | PRN

## 2020-01-23 MED ORDER — LACTATED RINGERS IV BOLUS
1000.0000 mL | Freq: Once | INTRAVENOUS | Status: AC
  Administered 2020-01-23: 1000 mL via INTRAVENOUS

## 2020-01-23 NOTE — ED Triage Notes (Signed)
Pt arrives via EMS with complaint of nausea/vomiting/diarrhea x ?3 weeks. Was seen at [facility] yesterday and was given ivf's but symptoms are worsening today. Took imodium at 12 noon today. Diarrhea 2-3 times per day,

## 2020-01-23 NOTE — ED Provider Notes (Signed)
[facility] EMERGENCY DEPARTMENT Provider Note   CSN: [id number] Arrival date & time: 01/23/20  1317     History Chief Complaint  Patient presents with  . Emesis    Christina [name] is a 69 y.o. female with hx of DVT/PE on Eliquis, HTN, HLD who presents with N/V/D. She states that ever since her knee surgery in January she's had GI issues. It started with difficulty eating due to decreased appetite and foods tasted abnormal to her. She endorses about 40 pound unintentional weight loss since then. About one month ago she started to have N/V/D and abdominal pain. She was initially diagnosed with gastroenteritis by her PCP however symptoms persisted. She saw Dr. Shelton Silvas with general surgery on May 27th who ordered colonoscopy and EGD. She went to [facility] on May 29th and they did a CT scan of the abdomen/pelvis which was negative. She tolerated PO and was sent home. She went back to [facility] a day later and was admitted for ileitis. C.diff testing was negative. She was given Cipro/Flagyl and she symptomatically improved and was discharged home. Symptoms returned again and she went to [facility] on 6/4 and had another CT which was consistent with a diarrheal illness but no acute abnormality. She returns today with the same symptoms. She took a Motrin and Flagyl this morning and started to have N/V and several episodes of loose stools. She states that she has mild, intermittent generalized abdominal pain. She has had about 40 pounds of unintentional weight loss since January. She states that food just generally doesn't taste good but denies dysphagia. She feels generally weak and fatigued. She denies any bleeding problems.    HPI     Past Medical History:  Diagnosis Date  . Allergic rhinitis   . Clotting disorder (HCC)   . DVT (deep venous thrombosis) (HCC) 1990's  . DVT (deep venous thrombosis) (HCC) 1980s   in the leg behind my knee;  right   . Epicondylitis    right   . GERD  (gastroesophageal reflux disease)   . Hyperlipidemia   . Hypertension   . Osteoarthritis   . Pulmonary embolism [facility]) 1990's    Patient Active Problem List   Diagnosis Date Noted  . Abnormal LFTs 09/03/2019  . Status post total knee replacement, left 08/23/2019  . Status post total knee replacement, right 08/23/2019  . Acquired cyst of kidney 06/03/2019  . Osteoarthritis of left knee 06/12/2018  . Osteoarthritis of right knee 06/12/2018  . Acute idiopathic gout involving toe of left foot 04/13/2018  . Peripheral edema 04/13/2018  . Pain in left knee 10/11/2017  . On anticoagulant therapy 03/05/2014  . History of DVT of lower extremity 11/01/2012  . Hyperlipidemia with target LDL less than 100 05/12/2010  . BMI 35.0-35.9,adult 05/12/2010  . ESSENTIAL HYPERTENSION, BENIGN 05/12/2010  . ABNORMAL STRESS ELECTROCARDIOGRAM 05/12/2010    Past Surgical History:  Procedure Laterality Date  . COLONOSCOPY  2015  . CYSTECTOMY     From shoulder and neck  . KNEE ARTHROSCOPY Left    Dr Ranell Patrick  . SKIN GRAFT     ulcer on right leg   . TOTAL KNEE ARTHROPLASTY Right 08/23/2019   Procedure: TOTAL KNEE ARTHROPLASTY;  Surgeon: Beverely Low, MD;  Location: [facility];  Service: Orthopedics;  Laterality: Right;  . TUBAL LIGATION     Multiple     OB History   No obstetric history on file.     Family History  Problem Relation Age of  Onset  . Heart attack Mother   . Deep vein thrombosis Mother   . Diabetes Mother   . Heart disease Mother   . Hyperlipidemia Mother   . Cancer Father   . Hyperlipidemia Sister   . Deep vein thrombosis Daughter   . Other Neg Hx        Clotting disorder    Social History   Tobacco Use  . Smoking status: Former Smoker    Quit date: 08/16/1979    Years since quitting: 40.4  . Smokeless tobacco: Never Used  . Tobacco comment: x33 years  Vaping Use  . Vaping Use: Never used  Substance Use Topics  . Alcohol use: No    Alcohol/week: 0.0 standard drinks  .  Drug use: No    Home Medications Prior to Admission medications   Medication Sig Start Date End Date Taking? Authorizing Provider  apixaban (ELIQUIS) 5 MG TABS tablet Take 1 tablet by mouth 2 (two) times daily. 01/22/20  Yes [provider]  HYDROcodone-acetaminophen (NORCO/VICODIN) 5-325 MG tablet Take 1 tablet by mouth every 6 (six) hours. 01/16/20  Yes [provider]  loperamide (IMODIUM) 2 MG capsule Take 1 capsule by mouth 4 (four) times daily as needed. 01/22/20 02/21/20 Yes [provider]  metroNIDAZOLE (FLAGYL) 500 MG tablet Take 1 tablet by mouth 3 (three) times daily. 01/16/20  Yes [provider]  ondansetron (ZOFRAN-ODT) 4 MG disintegrating tablet Take 1 tablet by mouth every 8 (eight) hours. 01/11/20  Yes [provider]  pantoprazole (PROTONIX) 40 MG tablet TAKE 1 TABLET BY MOUTH EVERY DAY 01/20/20  Yes Hassell Done, Mary-Margaret, FNP  amLODipine (NORVASC) 5 MG tablet TAKE 1 TABLET BY MOUTH EVERY DAY Patient taking differently: Take 5 mg by mouth daily.  01/11/19   Hassell Done, Mary-Margaret, FNP  diclofenac sodium (VOLTAREN) 1 % GEL Apply 2 g topically 4 (four) times daily. Patient taking differently: Apply 2 g topically 3 (three) times daily as needed (knee pain).  06/03/19   Baruch Gouty, FNP  metoprolol succinate (TOPROL-XL) 25 MG 24 hr tablet Take 1 tablet (25 mg total) by mouth daily. (11/25/2019 - HOLDING FOR NOW) 11/25/19   Arnoldo Lenis, MD  olmesartan (BENICAR) 40 MG tablet TAKE 1 TABLET BY MOUTH EVERY DAY 12/30/19   Chevis Pretty, FNP  warfarin (COUMADIN) 10 MG tablet TAKE 1 TABLET BY MOUTH EVERY DAY Patient not taking: Reported on 01/23/2020 12/16/19   Chevis Pretty, FNP    Allergies    Ace inhibitors  Review of Systems   Review of Systems  Constitutional: Positive for fatigue and unexpected weight change. Negative for chills and fever.  Respiratory: Negative for shortness of breath.   Cardiovascular: Negative for chest  pain.  Gastrointestinal: Positive for abdominal pain, diarrhea, nausea and vomiting.  Genitourinary: Negative for dysuria.  All other systems reviewed and are negative.   Physical Exam Updated Vital Signs BP 106/79 (BP Location: Left Arm)   Temp 97.9 F (36.6 C) (Oral)   Resp 15   Ht 5' 9.5" (1.765 m)   Wt 68 kg   LMP  (LMP Unknown)   SpO2 99%   BMI 21.83 kg/m   Physical Exam Vitals and nursing note reviewed.  Constitutional:      General: She is not in acute distress.    Appearance: Normal appearance. She is well-developed. She is cachectic. She is ill-appearing.  HENT:     Head: Normocephalic and atraumatic.  Eyes:     General: No scleral  icterus.       Right eye: No discharge.        Left eye: No discharge.     Conjunctiva/sclera: Conjunctivae normal.     Pupils: Pupils are equal, round, and reactive to light.  Cardiovascular:     Rate and Rhythm: Tachycardia present.  Pulmonary:     Effort: Pulmonary effort is normal. No respiratory distress.     Breath sounds: Normal breath sounds.  Abdominal:     General: There is no distension.     Palpations: Abdomen is soft.     Tenderness: There is abdominal tenderness (mild, generalized).  Musculoskeletal:     Cervical back: Normal range of motion.  Skin:    General: Skin is warm and dry.  Neurological:     Mental Status: She is alert and oriented to person, place, and time.  Psychiatric:        Behavior: Behavior normal.     ED Results / Procedures / Treatments   Labs (all labs ordered are listed, but only abnormal results are displayed) Labs Reviewed  CBC WITH DIFFERENTIAL/PLATELET - Abnormal; Notable for the following components:      Result Value   WBC 12.2 (*)    Hemoglobin 10.9 (*)    HCT 33.1 (*)    RDW 16.3 (*)    Neutro Abs 10.7 (*)    Lymphs Abs 0.6 (*)    Abs Immature Granulocytes 0.16 (*)    All other components within normal limits  COMPREHENSIVE METABOLIC PANEL - Abnormal; Notable for the  following components:   Glucose, Bld 109 (*)    Total Protein 5.1 (*)    Albumin 2.9 (*)    Total Bilirubin 1.6 (*)    All other components within normal limits  LIPASE, BLOOD - Abnormal; Notable for the following components:   Lipase 120 (*)    All other components within normal limits  CULTURE, BLOOD (ROUTINE X 2)  CULTURE, BLOOD (ROUTINE X 2)  SARS CORONAVIRUS 2 BY RT PCR (HOSPITAL ORDER, PERFORMED IN [facility])  LACTIC ACID, PLASMA  URINALYSIS, ROUTINE W REFLEX MICROSCOPIC  LACTIC ACID, PLASMA    EKG None  Radiology No results found.  Procedures Procedures (including critical care time)  Medications Ordered in ED Medications  lactated ringers bolus 1,000 mL (0 mLs Intravenous Stopped 01/23/20 1725)  ondansetron (ZOFRAN) injection 4 mg (4 mg Intravenous Given 01/23/20 1627)    ED Course  I have reviewed the triage vital signs and the nursing notes.  Pertinent labs & imaging results that were available during my care of the patient were reviewed by me and considered in my medical decision making (see chart for details).  69 year old female presents with abdominal pain, N/V/D and unintentional weight loss. Symptoms have been gradually worsening over months. She has had multiple imaging studies which show diarrheal illness but nothing specific. On exam she is ill appearing and cachetic. She is significant tachycardic with HR in the 130s and hypotensive. Will obtain labs, lacate, blood cultures. Shared visit with Dr. Juleen China.   Labs show mild leukocytosis (12) and mild anemia (10). CMP shows low protein. Lipase is 120. Lactate is normal. UA is pending. Will discuss with GI to get further recommendations. At this point pt needs a diagnosis and I don't think it's in her best interest to wait until the end of the month. Pt and her husband are not interested in returning to [facility].  Discussed with Dr. Jena Gauss.  He agrees to see pt in the AM. Reassessed pt. HR and  BP are improved after 1L of fluid.  Discussed with Dr. Luberta Robertson with [facility] who will admit.  MDM Rules/Calculators/A&P                           Final Clinical Impression(s) / ED Diagnoses Final diagnoses:  Nausea vomiting and diarrhea  Failure to thrive in adult    Rx / DC Orders ED Discharge Orders    None       Bethel Born, PA-C 01/23/20 1926    Raeford Razor, MD 01/25/20 1323

## 2020-01-23 NOTE — Telephone Encounter (Signed)
Left message referral will be done at your appointment.

## 2020-01-23 NOTE — Telephone Encounter (Signed)
Referral can wait until appointment

## 2020-01-23 NOTE — H&P (Addendum)
History and Physical:    Christina [name]   JIR:[id number] DOB: [birth year] DOA: 01/23/2020  Referring MD/provider: PA Ruthann Cancer PCP: Chevis Pretty, FNP   Patient coming from: Home  Chief Complaint: Subacute weight loss nausea vomiting and diarrhea  History of Present Illness:   Christina [name] is an 69 y.o. female with PMH significant for HTN and hypercoagulable state on Eliquis who was admitted primarily because family refused to take her home because they are so frustrated with patient's ongoing weight loss nausea vomiting and diarrhea which has been worked up at [facility] and is awaiting outpatient colonoscopy and EGD.  Patient states that her weight loss issues date back to 2019 when she initially tried to lose weight because she weighed about 235 pounds as far she remembers.  However over the past year and increasingly over the past 6 months patient has had significant increase in her weight loss even though she is no longer trying to lose weight.  She believes she now weighs 150 pounds.  Patient also notes she has chronic diarrhea nausea vomiting anorexia and initially had loss of taste and smell.  She notes all of the symptoms have worsened significantly since she had her knee replaced in January.  Patient was seen at [facility] and was diagnosed with gastroenteritis and treated conservatively with some improvement of symptoms.  CT scan of the abdomen pelvis was unrevealing.  Patient returned to [facility] last week with recurrent diarrhea.  C. difficile was ruled out at the time.  She was treated with Cipro/Flagyl with improvement in symptoms and she was discharged on 530.  Patient is now brought back into ED by family who is very frustrated that patient continues to have persistent diarrhea, nausea and vomiting and persistent weight loss.  They would like to have something done and do not want a wait for outpatient colonoscopy and EGD.   ED Course:  The patient was noted  to be slightly hypotensive with some tachycardia.  Laboratory data are notable primarily for a lipase of 120.  As noted previously CT scan of abdomen pelvis done last week at [facility] is unrevealing.  ROS:   ROS   Review of Systems: General: Denies fever, chills, malaise,  Respiratory: Denies cough, SOB at rest or hemoptysis Cardiovascular: Denies chest pain or palpitations CNS: Denies HA, dizziness, confusion, new weakness or clumsiness. Blood/lymphatics: Denies easy bruising or bleeding   Past Medical History:   Past Medical History:  Diagnosis Date  . Allergic rhinitis   . Clotting disorder ([place])   . DVT (deep venous thrombosis) ([place]) 1990's  . DVT (deep venous thrombosis) ([place]) 1980s   in the leg behind my knee;  right   . Epicondylitis    right   . GERD (gastroesophageal reflux disease)   . Hyperlipidemia   . Hypertension   . Osteoarthritis   . Pulmonary embolism ([place]) 1990's    Past Surgical History:   Past Surgical History:  Procedure Laterality Date  . COLONOSCOPY  2015  . CYSTECTOMY     From shoulder and neck  . KNEE ARTHROSCOPY Left    Dr Veverly Fells  . SKIN GRAFT     ulcer on right leg   . TOTAL KNEE ARTHROPLASTY Right 08/23/2019   Procedure: TOTAL KNEE ARTHROPLASTY;  Surgeon: Netta Cedars, MD;  Location: [facility];  Service: Orthopedics;  Laterality: Right;  . TUBAL LIGATION     Multiple    Social History:   Social History  Socioeconomic History  . Marital status: Married    Spouse name: Not on file  . Number of children: 5  . Years of education: Not on file  . Highest education level: Not on file  Occupational History  . Not on file  Tobacco Use  . Smoking status: Former Smoker    Quit date: 08/16/1979    Years since quitting: 40.4  . Smokeless tobacco: Never Used  . Tobacco comment: x33 years  Vaping Use  . Vaping Use: Never used  Substance and Sexual Activity  . Alcohol use: No    Alcohol/week: 0.0 standard drinks  . Drug use: No  .  Sexual activity: Not on file  Other Topics Concern  . Not on file  Social History Narrative   Married.   Social Determinants of Health   Financial Resource Strain:   . Difficulty of Paying Living Expenses:   Food Insecurity:   . Worried About Programme researcher, broadcasting/film/video in the Last Year:   . Barista in the Last Year:   Transportation Needs:   . Freight forwarder (Medical):   Marland Kitchen Lack of Transportation (Non-Medical):   Physical Activity:   . Days of Exercise per Week:   . Minutes of Exercise per Session:   Stress:   . Feeling of Stress :   Social Connections:   . Frequency of Communication with Friends and Family:   . Frequency of Social Gatherings with Friends and Family:   . Attends Religious Services:   . Active Member of Clubs or Organizations:   . Attends Banker Meetings:   Marland Kitchen Marital Status:   Intimate Partner Violence:   . Fear of Current or Ex-Partner:   . Emotionally Abused:   Marland Kitchen Physically Abused:   . Sexually Abused:     Allergies   Ace inhibitors  Family history:   Family History  Problem Relation Age of Onset  . Heart attack Mother   . Deep vein thrombosis Mother   . Diabetes Mother   . Heart disease Mother   . Hyperlipidemia Mother   . Cancer Father   . Hyperlipidemia Sister   . Deep vein thrombosis Daughter   . Other Neg Hx        Clotting disorder    Current Medications:   Prior to Admission medications   Medication Sig Start Date End Date Taking? Authorizing Provider  amLODipine (NORVASC) 5 MG tablet TAKE 1 TABLET BY MOUTH EVERY DAY Patient taking differently: Take 5 mg by mouth daily.  01/11/19  Yes Daphine Deutscher, Mary-Margaret, FNP  apixaban (ELIQUIS) 5 MG TABS tablet Take 1 tablet by mouth 2 (two) times daily. 01/22/20  Yes [provider]  diclofenac sodium (VOLTAREN) 1 % GEL Apply 2 g topically 4 (four) times daily. Patient taking differently: Apply 2 g topically 3 (three) times daily as needed (knee pain).  06/03/19   Yes Rakes, Doralee Albino, FNP  HYDROcodone-acetaminophen (NORCO/VICODIN) 5-325 MG tablet Take 1 tablet by mouth every 6 (six) hours. 01/16/20  Yes [provider]  loperamide (IMODIUM) 2 MG capsule Take 1 capsule by mouth 4 (four) times daily as needed. 01/22/20 02/21/20 Yes [provider]  metroNIDAZOLE (FLAGYL) 500 MG tablet Take 1 tablet by mouth 3 (three) times daily. 01/16/20  Yes [provider]  olmesartan (BENICAR) 40 MG tablet TAKE 1 TABLET BY MOUTH EVERY DAY 12/30/19  Yes Daphine Deutscher, Mary-Margaret, FNP  ondansetron (ZOFRAN-ODT) 4 MG disintegrating tablet Take 1 tablet by mouth  every 8 (eight) hours. 01/11/20  Yes [provider]  pantoprazole (PROTONIX) 40 MG tablet TAKE 1 TABLET BY MOUTH EVERY DAY 01/20/20  Yes Daphine Deutscher, Mary-Margaret, FNP  metoprolol succinate (TOPROL-XL) 25 MG 24 hr tablet Take 1 tablet (25 mg total) by mouth daily. (11/25/2019 - HOLDING FOR NOW) Patient not taking: Reported on 01/23/2020 11/25/19   Antoine Poche, MD    Physical Exam:   Vitals:   01/23/20 1814 01/23/20 1830 01/23/20 1900 01/23/20 1930  BP:  113/67 108/66 106/69  Pulse: (!) 101 95 93 95  Resp: 14 15 (!) 21 19  Temp:      TempSrc:      SpO2: 98% 99% 97% 98%  Weight:      Height:         Physical Exam: Blood pressure 106/69, pulse 95, temperature 97.9 F (36.6 C), temperature source Oral, resp. rate 19, height 5' 9.5" (1.765 m), weight 68 kg, SpO2 98 %. Gen: Relatively well-appearing female lying flat in bed in no acute distress looking younger than stated age Eyes: sclera anicteric, conjuctiva mildly injected bilaterally CVS: S1-S2, regulary, no gallops Respiratory: Good air entry bilaterally GI: NABS, soft, NT, no masses, no splenomegaly, no hepatomegaly.  Murphy sign negative.  No rebound tenderness or guarding. LE: No edema. No cyanosis Neuro: A/O x 3, Moving all extremities equally with normal strength, CN 3-12 intact, grossly nonfocal.  Psych: patient is logical and  coherent, judgement and insight appear normal, mood and affect appropriate to situation. Skin: no rashes or lesions or ulcers,    Data Review:    Labs: Basic Metabolic Panel: Recent Labs  Lab 01/23/20 1614  NA 138  K 4.3  CL 104  CO2 22  GLUCOSE 109*  BUN 8  CREATININE 0.77  CALCIUM 9.2   Liver Function Tests: Recent Labs  Lab 01/23/20 1614  AST 23  ALT 13  ALKPHOS 61  BILITOT 1.6*  PROT 5.1*  ALBUMIN 2.9*   Recent Labs  Lab 01/23/20 1614  LIPASE 120*   No results for input(s): AMMONIA in the last 168 hours. CBC: Recent Labs  Lab 01/23/20 1614  WBC 12.2*  NEUTROABS 10.7*  HGB 10.9*  HCT 33.1*  MCV 81.1  PLT 250   Cardiac Enzymes: No results for input(s): CKTOTAL, CKMB, CKMBINDEX, TROPONINI in the last 168 hours.  BNP (last 3 results) No results for input(s): PROBNP in the last 8760 hours. CBG: No results for input(s): GLUCAP in the last 168 hours.  Urinalysis    Component Value Date/Time   APPEARANCEUR Clear 11/07/2019 1557   GLUCOSEU Negative 11/07/2019 1557   BILIRUBINUR Negative 11/07/2019 1557   PROTEINUR Negative 11/07/2019 1557   NITRITE Negative 11/07/2019 1557   LEUKOCYTESUR 1+ (A) 11/07/2019 1557      Radiographic Studies: No results found.  EKG: Ordered and pending   Assessment/Plan:   Principal Problem:   Weight loss, unintentional Active Problems:   Essential hypertension, benign  69 year old female with subacute weight loss and unexplained nausea vomiting and diarrhea.  Previous work-up at [facility] has been unrevealing.  Here patient is noted to have a lipase of 120, mild leukocytosis and normocytic anemia.  On admission she had relative low blood pressure and tachycardia to 130 likely secondary to decreased p.o. intake.   Unexplained weight loss with nausea vomiting and diarrhea Symptoms date back to 6 months and apparently almost back to 2019 per patient. Unclear etiology, she does have elevated lipase with  negative abdominal  pelvic CT, mild leukocytosis and normocytic anemia.. She possibly has malabsorption, can consider stool studies for fecal fat. Patient would also benefit from colonoscopy and possibly biopsy to rule out UC, Crohn's or microscopic colitis. GI has been consulted in the ED and they will come see her tomorrow No imaging has been repeated here as results can be found on care everywhere.  Low blood pressure/tachycardia Likely secondary to decreased p.o. intake intravascular ROM depletion Much improved with 1 L bolus in ED We will continue hydration at 125 cc an hour  HTN Hold antihypertensives including amlodipine, Toprol-XL and olmesartan given relatively lower blood pressure and decreased p.o. intake.  History of DVT/hypercoagulable state Continue Eliquis   Other information:   DVT prophylaxis: Eliquis ordered. Code Status: Full Family Communication: Patient's family was at bedside during ED evaluation Disposition Plan: Home Consults called: GI Admission status: Observation  Clova Morlock Tublu Albirta Rhinehart [facility]  If 7PM-7AM, please contact night-coverage [email or web address] Password [facility] 01/23/2020, 8:19 PM

## 2020-01-24 ENCOUNTER — Encounter (HOSPITAL_COMMUNITY): Payer: Self-pay | Admitting: Internal Medicine

## 2020-01-24 DIAGNOSIS — I10 Essential (primary) hypertension: Secondary | ICD-10-CM | POA: Diagnosis not present

## 2020-01-24 DIAGNOSIS — R112 Nausea with vomiting, unspecified: Secondary | ICD-10-CM

## 2020-01-24 DIAGNOSIS — R197 Diarrhea, unspecified: Secondary | ICD-10-CM | POA: Diagnosis not present

## 2020-01-24 DIAGNOSIS — K436 Other and unspecified ventral hernia with obstruction, without gangrene: Secondary | ICD-10-CM

## 2020-01-24 DIAGNOSIS — R634 Abnormal weight loss: Secondary | ICD-10-CM

## 2020-01-24 LAB — FOLATE: Folate: 5.8 ng/mL — ABNORMAL LOW (ref >5.9)

## 2020-01-24 LAB — CBC
HCT: 29.5 % — ABNORMAL LOW (ref 36.0–46.0)
Hemoglobin: 9.2 g/dL — ABNORMAL LOW (ref 12.0–15.0)
MCH: 25.6 pg — ABNORMAL LOW (ref 26.0–34.0)
MCHC: 31.2 g/dL (ref 30.0–36.0)
MCV: 82.2 fL (ref 80.0–100.0)
Platelets: 225 10*3/uL (ref 150–400)
RBC: 3.59 MIL/uL — ABNORMAL LOW (ref 3.87–5.11)
RDW: 16.3 % — ABNORMAL HIGH (ref 11.5–15.5)
WBC: 6.8 10*3/uL (ref 4.0–10.5)
nRBC: 0 % (ref 0.0–0.2)

## 2020-01-24 LAB — COMPREHENSIVE METABOLIC PANEL
ALT: 12 U/L (ref 0–44)
AST: 19 U/L (ref 15–41)
Albumin: 2.4 g/dL — ABNORMAL LOW (ref 3.5–5.0)
Alkaline Phosphatase: 52 U/L (ref 38–126)
Anion gap: 6 (ref 5–15)
BUN: 9 mg/dL (ref 8–23)
CO2: 25 mmol/L (ref 22–32)
Calcium: 8.6 mg/dL — ABNORMAL LOW (ref 8.9–10.3)
Chloride: 107 mmol/L (ref 98–111)
Creatinine, Ser: 0.76 mg/dL (ref 0.44–1.00)
GFR calc Af Amer: >60 mL/min (ref >60)
GFR calc non Af Amer: >60 mL/min (ref >60)
Glucose, Bld: 91 mg/dL (ref 70–99)
Potassium: 3.6 mmol/L (ref 3.5–5.1)
Sodium: 138 mmol/L (ref 135–145)
Total Bilirubin: 1.3 mg/dL — ABNORMAL HIGH (ref 0.3–1.2)
Total Protein: 4.7 g/dL — ABNORMAL LOW (ref 6.5–8.1)

## 2020-01-24 LAB — VITAMIN B12: Vitamin B-12: 562 pg/mL (ref 180–914)

## 2020-01-24 LAB — HIV ANTIBODY (ROUTINE TESTING W REFLEX): HIV Screen 4th Generation wRfx: NONREACTIVE

## 2020-01-24 LAB — IRON AND TIBC
Iron: 37 ug/dL (ref 28–170)
Saturation Ratios: 28 % (ref 10.4–31.8)
TIBC: 133 ug/dL — ABNORMAL LOW (ref 250–450)
UIBC: 96 ug/dL

## 2020-01-24 LAB — FERRITIN: Ferritin: 201 ng/mL (ref 11–307)

## 2020-01-24 LAB — HEMOGLOBIN A1C
Hgb A1c MFr Bld: 5.9 % — ABNORMAL HIGH (ref 4.8–5.6)
Mean Plasma Glucose: 122.63 mg/dL

## 2020-01-24 MED ORDER — ENOXAPARIN SODIUM 80 MG/0.8ML ~~LOC~~ SOLN
70.0000 mg | Freq: Two times a day (BID) | SUBCUTANEOUS | Status: DC
  Administered 2020-01-25 (×2): 70 mg via SUBCUTANEOUS
  Filled 2020-01-24 (×3): qty 0.8

## 2020-01-24 NOTE — Consult Note (Signed)
Referring Provider: Standley Dakins, MD Primary Care Physician:  Bennie Pierini, FNP Primary Gastroenterologist:  Dr. Karilyn Cota  Reason for Consultation:    Abdominal pain nausea vomiting and weight loss.  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  Since then she has had intermittent abdominal pain nausea vomiting.  She also has noted nocturnal regurgitation.  She was begun on PPI by by PCP but could not tell any difference.  At times she has had good appetite but unable to eat because of the symptoms.  She says since her surgery she has lost 50 pounds.  Her weight in our office was 204 pounds. Patient states few weeks ago she was admitted to [facility] for gross hematuria.  Her hemoglobin was low and she required blood transfusion.  She was evaluated by Dr. Arlyn Dunning and she is scheduled for colonoscopy on 631 2021.  She had normal colonoscopy by Dr. Jones Skene of [facility] in April 03, 2014.  Patient reports pain in upper and mid abdomen intermittently over the last few months.  She also has noted lump in her upper abdomen sometime last fall.  She states when she is sick and having more pain she has noted this lump to enlarge. She denies melena or rectal bleeding or hematemesis.  She also denies fever chills or night sweats.  Patient is married.  She has 5 children.  She has 2 sons and 3 daughters.  One of her sons and 1 daughter they both have clotting disorder.  She worked in Veterinary surgeon for 40 years and after retirement went to work for the [employer].  She works in Development worker, community and drives a bus.  She generally works 30 to 40 hours a week.  She quit cigarette smoking 40 years ago after having smoked for 5 to 6 years up  to a pack a day.  She has never drank alcohol.  Father was diagnosed with throat cancer and died within 3 years around 73.  Mother died of MI at age 90.  She has a sister 28 in good health.  Her brother who is exmarine is doing well at age 16.  He had to have cystectomy for bladder cancer.  Past Medical History:  Diagnosis Date  . Allergic rhinitis   . Clotting disorder (HCC)   . DVT (deep venous thrombosis) (HCC) 1990's  . DVT (deep venous thrombosis) (HCC) 1980s   in the leg behind my knee;  right   . Epicondylitis    right   . GERD (gastroesophageal reflux disease)   . Hyperlipidemia   . Hypertension   . Osteoarthritis   . Pulmonary embolism (HCC) 1990's    Past Surgical History:  Procedure Laterality Date  . COLONOSCOPY  2015  . CYSTECTOMY     From shoulder and neck  . KNEE ARTHROSCOPY Left    Dr Ranell Patrick  . SKIN GRAFT     ulcer on right leg   . TOTAL KNEE ARTHROPLASTY Right 08/23/2019   Procedure: TOTAL KNEE ARTHROPLASTY;  Surgeon: Beverely Low, MD;  Location: [facility];  Service: Orthopedics;  Laterality: Right;  . TUBAL LIGATION     Multiple    Prior to Admission medications   Medication Sig Start Date End  Date Taking? Authorizing Provider  amLODipine (NORVASC) 5 MG tablet TAKE 1 TABLET BY MOUTH EVERY DAY Patient taking differently: Take 5 mg by mouth daily.  01/11/19  Yes Hassell Done, Mary-Margaret, FNP  apixaban (ELIQUIS) 5 MG TABS tablet Take 1 tablet by mouth 2 (two) times daily. 01/22/20  Yes [provider]  diclofenac sodium (VOLTAREN) 1 % GEL Apply 2 g topically 4 (four) times daily. Patient taking differently: Apply 2 g topically 3 (three) times daily as needed (knee pain).  06/03/19  Yes Rakes, Connye Burkitt, FNP  HYDROcodone-acetaminophen (NORCO/VICODIN) 5-325 MG tablet Take 1 tablet by mouth every 6 (six) hours. 01/16/20  Yes [provider]  loperamide (IMODIUM) 2 MG capsule Take 1 capsule by mouth 4 (four) times daily as needed. 01/22/20 02/21/20 Yes [provider]  metroNIDAZOLE (FLAGYL) 500 MG tablet Take 1 tablet by mouth 3 (three) times daily. 01/16/20  Yes [provider]  olmesartan (BENICAR) 40 MG tablet TAKE 1 TABLET BY MOUTH EVERY DAY 12/30/19  Yes Hassell Done, Mary-Margaret, FNP  ondansetron (ZOFRAN-ODT) 4 MG disintegrating tablet Take 1 tablet by mouth every 8 (eight) hours. 01/11/20  Yes [provider]  pantoprazole (PROTONIX) 40 MG tablet TAKE 1 TABLET BY MOUTH EVERY DAY 01/20/20  Yes Hassell Done, Mary-Margaret, FNP  metoprolol succinate (TOPROL-XL) 25 MG 24 hr tablet Take 1 tablet (25 mg total) by mouth daily. (11/25/2019 - HOLDING FOR NOW) Patient not taking: Reported on 01/23/2020 11/25/19   Arnoldo Lenis, MD    Current Facility-Administered Medications  Medication Dose Route Frequency Provider Last Rate Last Admin  . acetaminophen (TYLENOL) tablet 650 mg  650 mg Oral Q6H PRN Vashti Hey, MD       Or  . acetaminophen (TYLENOL) suppository 650 mg  650 mg Rectal Q6H PRN Bonnell Public Tublu, MD      . apixaban Arne Cleveland) tablet 5 mg  5 mg Oral BID Bonnell Public Tublu, MD   5 mg at 01/24/20 0825  . diclofenac sodium (VOLTAREN) 1 % transdermal gel 2 g  2 g Topical TID PRN Bonnell Public Tublu, MD      . lactated ringers infusion   Intravenous Continuous Vashti Hey, MD 125 mL/hr at 01/24/20 0536 New Bag at 01/24/20 0536  . loperamide (IMODIUM) capsule 2 mg  2 mg Oral QID PRN Bonnell Public Tublu, MD      . ondansetron [facility]) tablet 4 mg  4 mg Oral Q6H PRN Vashti Hey, MD       Or  . ondansetron [facility]) injection 4 mg  4 mg Intravenous Q6H PRN Bonnell Public Tublu, MD      . pantoprazole (PROTONIX) EC tablet 40 mg  40 mg Oral Daily Bonnell Public Tublu, MD   40 mg at 01/24/20 0825    Allergies as of 01/23/2020 - Review Complete 01/23/2020  Allergen Reaction Noted  . Ace inhibitors Cough 12/30/2010    Family History  Problem Relation Age of  Onset  . Heart attack Mother   . Deep vein thrombosis Mother   . Diabetes Mother   . Heart disease Mother   . Hyperlipidemia Mother   . Cancer Father   . Hyperlipidemia Sister   . Deep vein thrombosis Daughter   . Other Neg Hx        Clotting disorder    Social History   Socioeconomic History  . Marital status: Married    Spouse name: Not on file  . Number of children: 5  .  Years of education: Not on file  . Highest education level: Not on file  Occupational History  . Not on file  Tobacco Use  . Smoking status: Former Smoker    Quit date: 08/16/1979    Years since quitting: 40.4  . Smokeless tobacco: Never Used  . Tobacco comment: x33 years  Vaping Use  . Vaping Use: Never used  Substance and Sexual Activity  . Alcohol use: No    Alcohol/week: 0.0 standard drinks  . Drug use: No  . Sexual activity: Not on file  Other Topics Concern  . Not on file  Social History Narrative   Married.   Social Determinants of Health   Financial Resource Strain:   . Difficulty of Paying Living Expenses:   Food Insecurity:   . Worried About Programme researcher, broadcasting/film/video in the Last Year:   . Barista in the Last Year:   Transportation Needs:   . Freight forwarder (Medical):   Marland Kitchen Lack of Transportation (Non-Medical):   Physical Activity:   . Days of Exercise per Week:   . Minutes of Exercise per Session:   Stress:   . Feeling of Stress :   Social Connections:   . Frequency of Communication with Friends and Family:   . Frequency of Social Gatherings with Friends and Family:   . Attends Religious Services:   . Active Member of Clubs or Organizations:   . Attends Banker Meetings:   Marland Kitchen Marital Status:   Intimate Partner Violence:   . Fear of Current or Ex-Partner:   . Emotionally Abused:   Marland Kitchen Physically Abused:   . Sexually Abused:     Review of Systems: See HPI, otherwise normal ROS  Physical Exam: Temp:  [97.8 F (36.6 C)-98.7 F (37.1 C)] 98.7 F (37.1  C) (06/11 0547) Pulse Rate:  [86-101] 93 (06/11 0547) Resp:  [14-24] 16 (06/11 0547) BP: (104-117)/(61-77) 104/61 (06/11 0547) SpO2:  [97 %-100 %] 100 % (06/11 0547) Weight:  [69.5 kg] 69.5 kg (06/11 0558) Last BM Date: 01/24/20  Patient is alert and in no acute distress. Conjunctivae is pink.  Sclerae nonicteric. Oropharyngeal mucosa is normal.   Partial upper and lower dental plate. Neck without masses thyromegaly or lymphadenopathy. Cardiac exam with regular rhythm normal S1 and S2.  No murmur gallop noted. Auscultation lungs reveal vesicular breath sounds bilaterally. Abdomen is symmetrical.  She has fullness in upper abdomen.  Bowel sounds are normal.  On palpation abdomen is soft.  She has mild tenderness across lower abdomen without guarding or rebound.  She has hernia in the upper mid abdomen which is tender but soft and partially reducible.  No organomegaly or masses. Right knee is larger than the left knee.  He has trace edema to ankles.   Lab Results: Recent Labs    01/23/20 1614 01/24/20 0613  WBC 12.2* 6.8  HGB 10.9* 9.2*  HCT 33.1* 29.5*  PLT 250 225   BMET Recent Labs    01/23/20 1614 01/24/20 0613  NA 138 138  K 4.3 3.6  CL 104 107  CO2 22 25  GLUCOSE 109* 91  BUN 8 9  CREATININE 0.77 0.76  CALCIUM 9.2 8.6*   LFT Recent Labs    01/24/20 0613  PROT 4.7*  ALBUMIN 2.4*  AST 19  ALT 12  ALKPHOS 52  BILITOT 1.3*   CT images from 01/08/2020 reviewed and compared with study from 01/17/2020 performed at [facility].  Thickening at  GE junction appears to be due to small sliding hiatal hernia. There is ventral hernia containing omentum.  There inflammatory changes as well as fluid.  There is no bowel in either of the studies in the hernial sac. 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  Assessment;  Patient is 69 year old African-American female who underwent right knee replacement in January 2021 who has been experiencing  intermittent abdominal pain nausea and vomiting associated with 50 pound weight loss.  She has history of mildly elevated transaminases felt to be due to fatty liver for which have seen patient in the past.  Transaminases are now normal.  Patient CT reveals ventral hernia containing omentum with some fluid and inflammatory changes.  This hernia is tender and only partially reducible.  I suspect patient's symptoms are secondary to this hernia and I believe she will need surgical intervention. Patient serum albumin is low but hypoalbuminemia with appear to be due to her inability to eat no nutritional or not due to hepatic disease.  She does not have any changes of cirrhosis.  Given history of elevated transaminase in the past I would ask Dr. Henreitta Leber if liver biopsy could be performed at the surgery but it is not critical.  Patient's anemia appears to be multifactorial.  She had gross hematuria with supratherapeutic INR and received a unit of PRBCs at [facility] few weeks ago.  Will rule out iron and B12 and folate deficiency.  She is up-to-date on screening colonoscopy.  Last exam in August 2015 was normal by Dr. Jones Skene of [facility].  Patient with clotting disorder.  Review of records in epic do not show as to the type of abnormality that she had.  Recommendations;  Serum iron TIBC B12 and folate levels. Surgical consultation with Dr. Larae Grooms.  Case discussed with Dr. Johnnette Gourd and Dr. Larae Grooms.  Dr. Jena Gauss will be seeing patient over the weekend for GI coverage.    LOS: 0 days   Katelin Kutsch  01/24/2020, 3:41 PM

## 2020-01-24 NOTE — Progress Notes (Signed)
PROGRESS NOTE   Christina [name]  BLT:[id number] DOB: Mar 19, 1951 DOA: 01/23/2020 PCP: Bennie Pierini, FNP   Chief Complaint  Patient presents with  . Emesis    Brief Admission History:  69 year old female with history of DVT, hypertension, hyperlipidemia has apparently been suffering with abnormal weight loss of about 40 pounds of unintentional weight loss over the last several months and chronic diarrhea.  She was supposed to have an outpatient EGD and colonoscopy but has not been completed as of this time.  She has been taking Cipro and Flagyl and reports that she completed that after being diagnosed with an ileitis.  Her main complaint was diarrheal illness and generalized weakness.  Assessment & Plan:   Principal Problem:   Weight loss, unintentional Active Problems:   Essential hypertension, benign   Unexplained weight loss   Leukocytosis-resolved with supportive therapy.  Diarrheal illness-we have asked for GI consult to assist with diagnosis.  Continue supportive care.  Unintentional weight loss/abnormal weight loss-patient has been scheduled for outpatient EGD and colonoscopy and we have asked GI consultation in the hospital.  Mild dehydration-treating with gentle IV fluids.  Hypoalbuminemia-check prealbumin, dietitian consultation.  Hyperbilirubinemia-trending down with IV fluids.  Anemia-check anemia panel.  Coagulopathy-continue apixaban that she takes chronically.  DVT prophylaxis: Apixaban Code Status: Full Family Communication:  Disposition:   Status is: Observation   Dispo: The patient is from: The patient remains OBS appropriate and will d/c before 2 midnights.              Anticipated d/c is to: Home              Anticipated d/c date is: 2 days              Patient currently is not medically stable to d/c.  Consultants:   GI  Procedures:     Antimicrobials:     Subjective: Patient reports that she was able to eat some breakfast and  she is feeling less weak today.  Objective: Vitals:   01/23/20 2200 01/24/20 0215 01/24/20 0547 01/24/20 0558  BP: 116/72 117/74 104/61   Pulse: 93 86 93   Resp: 16 16 16    Temp: 98.2 F (36.8 C) 97.8 F (36.6 C) 98.7 F (37.1 C)   TempSrc: Oral Oral Oral   SpO2: 100% 99% 100%   Weight:    69.5 kg  Height:       No intake or output data in the 24 hours ending 01/24/20 1435 Filed Weights   01/23/20 1324 01/24/20 0558  Weight: 68 kg 69.5 kg    Examination:  General exam: Appears calm and comfortable  Respiratory system: Clear to auscultation. Respiratory effort normal. Cardiovascular system: S1 & S2 heard, RRR. No JVD, murmurs, rubs, gallops or clicks. No pedal edema. Gastrointestinal system: Abdomen is nondistended, soft and nontender. No organomegaly or masses felt. Normal bowel sounds heard. Central nervous system: Alert and oriented. No focal neurological deficits. Extremities: Symmetric 5 x 5 power. Skin: No rashes, lesions or ulcers Psychiatry: Judgement and insight appear normal. Mood & affect appropriate.   Data Reviewed: I have personally reviewed following labs and imaging studies  CBC: Recent Labs  Lab 01/23/20 1614 01/24/20 0613  WBC 12.2* 6.8  NEUTROABS 10.7*  --   HGB 10.9* 9.2*  HCT 33.1* 29.5*  MCV 81.1 82.2  PLT 250 225    Basic Metabolic Panel: Recent Labs  Lab 01/23/20 1614 01/24/20 0613  NA 138 138  K 4.3 3.6  CL  104 107  CO2 22 25  GLUCOSE 109* 91  BUN 8 9  CREATININE 0.77 0.76  CALCIUM 9.2 8.6*    GFR: Estimated Creatinine Clearance: 69.6 mL/min (by C-G formula based on SCr of 0.76 mg/dL).  Liver Function Tests: Recent Labs  Lab 01/23/20 1614 01/24/20 0613  AST 23 19  ALT 13 12  ALKPHOS 61 52  BILITOT 1.6* 1.3*  PROT 5.1* 4.7*  ALBUMIN 2.9* 2.4*    CBG: No results for input(s): GLUCAP in the last 168 hours.  Recent Results (from the past 240 hour(s))  Blood culture (routine x 2)     Status: None (Preliminary  result)   Collection Time: 01/23/20  4:36 PM   Specimen: Left Antecubital; Blood  Result Value Ref Range Status   Specimen Description LEFT ANTECUBITAL  Final   Special Requests   Final    BOTTLES DRAWN AEROBIC AND ANAEROBIC Blood Culture adequate volume   Culture   Final    NO GROWTH < 24 HOURS Performed at [facility], [address] [place], [place] [address]    Report Status PENDING  Incomplete  Blood culture (routine x 2)     Status: None (Preliminary result)   Collection Time: 01/23/20  4:36 PM   Specimen: Left Antecubital; Blood  Result Value Ref Range Status   Specimen Description LEFT ANTECUBITAL  Final   Special Requests   Final    BOTTLES DRAWN AEROBIC AND ANAEROBIC Blood Culture adequate volume   Culture   Final    NO GROWTH < 24 HOURS Performed at [facility], [address], [place], [place] [address]    Report Status PENDING  Incomplete  SARS Coronavirus 2 by RT PCR (hospital order, performed in [place] hospital lab) Nasopharyngeal Nasopharyngeal Swab     Status: None   Collection Time: 01/23/20  5:32 PM   Specimen: Nasopharyngeal Swab  Result Value Ref Range Status   SARS Coronavirus 2 NEGATIVE NEGATIVE Final    Comment: (NOTE) SARS-CoV-2 target nucleic acids are NOT DETECTED.  The SARS-CoV-2 RNA is generally detectable in upper and lower respiratory specimens during the acute phase of infection. The lowest concentration of SARS-CoV-2 viral copies this assay can detect is 250 copies / mL. A negative result does not preclude SARS-CoV-2 infection and should not be used as the sole basis for treatment or other patient management decisions.  A negative result may occur with improper specimen collection / handling, submission of specimen other than nasopharyngeal swab, presence of viral mutation(s) within the areas targeted by this assay, and inadequate number of viral copies (<250 copies / mL). A negative result must be combined with clinical observations,  patient history, and epidemiological information.  Fact Sheet for Patients:   StrictlyIdeas.no  Fact Sheet for Healthcare Providers: BankingDealers.co.za  This test is not yet approved or  cleared by the [place] FDA and has been authorized for detection and/or diagnosis of SARS-CoV-2 by FDA under an Emergency Use Authorization (EUA).  This EUA will remain in effect (meaning this test can be used) for the duration of the COVID-19 declaration under Section 564(b)(1) of the Act, 21 U.S.C. section 360bbb-3(b)(1), unless the authorization is terminated or revoked sooner.  Performed at [facility], [address], [place], [place] [address]      Radiology Studies: No results found.   Scheduled Meds: . apixaban  5 mg Oral BID  . pantoprazole  40 mg Oral Daily   Continuous Infusions: . lactated ringers 125 mL/hr  at 01/24/20 0536     LOS: 0 days   Time spent: 26 mins    Abagael Kramm Laural Benes, MD How to contact the [facility] Attending or Consulting provider 7A - 7P or covering provider during after hours 7P -7A, for this patient?  1. Check the care team in [facility] and look for a) attending/consulting TR[facility] provider listed and b) the [facility] team listed 2. Log into [email or web address] and use [place]'s universal password to access. If you do not have the password, please contact the hospital operator. 3. Locate the [facility] provider you are looking for under [facility] and page to a number that you can be directly reached. 4. If you still have difficulty reaching the provider, please page the [facility] (Director on Call) for the Hospitalists listed on amion for assistance.  01/24/2020, 2:35 PM

## 2020-01-24 NOTE — H&P (View-Only) (Signed)
Christina [name] Consult   Reason for Consult: Epigastric/ ventral hernia  Referring Physician:  Dr. Karilyn Cota   Chief Complaint    Emesis      HPI: Christina [name] is a 69 y.o. 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 She says that after her knee replacement 08/2019 that she has not wanted to eat or drink and that food takes bad.  She She has been see as an outpatient and had recommendations for possible EGD and colonoscopy, but these have not been completed. She comes to the hospital with the complaints of her diarrhea and generalized weakness.   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 She wants to get better to get back to work. Dr. Karilyn Cota has seen her and reports her last colonoscopy 2015.   She has mild transaminase elevation he thinks is from fatty liver, and he is worried about her hernia causing some of these symptoms.  He feels like she is up to date on her screening colonoscopy.   The patient notes the bulge in her upper abdomen and says it does get larger at times.    Past Medical History:  Diagnosis Date  . Allergic rhinitis   . Clotting disorder (HCC)   . DVT (deep venous thrombosis) (HCC) 1990's  . DVT (deep venous thrombosis) (HCC) 1980s   in the leg behind my knee;  right   . Epicondylitis    right   . GERD (gastroesophageal reflux disease)   . Hyperlipidemia   . Hypertension   . Osteoarthritis   . Pulmonary embolism (HCC) 1990's    Past Surgical History:  Procedure Laterality Date  . COLONOSCOPY  2015  . CYSTECTOMY     From shoulder and neck  . KNEE ARTHROSCOPY Left    Dr Ranell Patrick  . SKIN GRAFT     ulcer on right leg   . TOTAL KNEE ARTHROPLASTY Right 08/23/2019   Procedure: TOTAL KNEE ARTHROPLASTY;  Surgeon: Beverely Low, MD;  Location: [facility];  Service: Orthopedics;  Laterality:  Right;  . TUBAL LIGATION     Multiple    Family History  Problem Relation Age of Onset  . Heart attack Mother   . Deep vein thrombosis Mother   . Diabetes Mother   . Heart disease Mother   . Hyperlipidemia Mother   . Cancer Father   . Hyperlipidemia Sister   . Deep vein thrombosis Daughter   . Other Neg Hx        Clotting disorder    Social History   Tobacco Use  . Smoking status: Former Smoker    Quit date: 08/16/1979    Years since quitting: 40.4  . Smokeless tobacco: Never Used  . Tobacco comment: x33 years  Vaping Use  . Vaping Use: Never used  Substance Use Topics  . Alcohol use: No    Alcohol/week: 0.0 standard drinks  . Drug use: No    Medications: I have reviewed the patient's current medications. Current Facility-Administered Medications  Medication Dose Route Frequency Provider Last Rate Last Admin  . acetaminophen (TYLENOL) tablet 650 mg  650 mg Oral Q6H PRN Pieter Partridge, MD       Or  . acetaminophen (TYLENOL) suppository 650 mg  650 mg Rectal Q6H PRN Pieter Partridge, MD      . apixaban Everlene Balls) tablet  5 mg  5 mg Oral BID Leandro Reasonerhatterjee, Srobona Tublu, MD   5 mg at 01/24/20 2022  . diclofenac sodium (VOLTAREN) 1 % transdermal gel 2 g  2 g Topical TID PRN Leandro Reasonerhatterjee, Srobona Tublu, MD      . lactated ringers infusion   Intravenous Continuous Pieter Partridgehatterjee, Srobona Tublu, MD 125 mL/hr at 01/24/20 0536 New Bag at 01/24/20 0536  . loperamide (IMODIUM) capsule 2 mg  2 mg Oral QID PRN Leandro Reasonerhatterjee, Srobona Tublu, MD      . ondansetron [facility](ZOFRAN) tablet 4 mg  4 mg Oral Q6H PRN Pieter Partridgehatterjee, Srobona Tublu, MD       Or  . ondansetron [facility](ZOFRAN) injection 4 mg  4 mg Intravenous Q6H PRN Leandro Reasonerhatterjee, Srobona Tublu, MD      . pantoprazole (PROTONIX) EC tablet 40 mg  40 mg Oral Daily Leandro Reasonerhatterjee, Srobona Tublu, MD   40 mg at 01/24/20 0825   Allergies  Allergen Reactions  . Ace Inhibitors Cough          ROS:  A comprehensive review of systems was negative  except for: Gastrointestinal: positive for diarrhea, nausea, vomiting and poor appetite, poor po intake, some abdominal pain/ bulge with hernia  Blood pressure 120/81, pulse 85, temperature 98.9 F (37.2 C), temperature source Oral, resp. rate 16, height 5' 9.5" (1.765 m), weight 69.5 kg, SpO2 100 %. Physical Exam Vitals reviewed.  Constitutional:      General: She is not in acute distress. HENT:     Head: Normocephalic and atraumatic.     Nose: Nose normal.     Mouth/Throat:     Mouth: Mucous membranes are moist.  Eyes:     Extraocular Movements: Extraocular movements intact.  Cardiovascular:     Rate and Rhythm: Normal rate.  Pulmonary:     Effort: Pulmonary effort is normal.  Abdominal:     General: There is no distension.     Palpations: Abdomen is soft.     Tenderness: There is abdominal tenderness.     Hernia: A hernia is present.     Comments: incarceted epigastric hernia, tender over hernia, otherwise no tenderness   Musculoskeletal:        General: No swelling. Normal range of motion.     Cervical back: Normal range of motion.  Skin:    General: Skin is warm.  Neurological:     General: No focal deficit present.     Mental Status: She is alert and oriented to person, place, and time.  Psychiatric:        Mood and Affect: Mood normal.        Behavior: Behavior normal.        Thought Content: Thought content normal.        Judgment: Judgment normal.     Results: Results for orders placed or performed during the hospital encounter of 01/23/20 (from the past 48 hour(s))  Urinalysis, Routine w reflex microscopic     Status: Abnormal   Collection Time: 01/23/20  2:54 PM  Result Value Ref Range   Color, Urine YELLOW YELLOW   APPearance CLEAR CLEAR   Specific Gravity, Urine 1.011 1.005 - 1.030   pH 6.0 5.0 - 8.0   Glucose, UA NEGATIVE NEGATIVE mg/dL   Hgb urine dipstick SMALL (A) NEGATIVE   Bilirubin Urine NEGATIVE NEGATIVE   Ketones, ur 5 (A) NEGATIVE mg/dL    Protein, ur NEGATIVE NEGATIVE mg/dL   Nitrite NEGATIVE NEGATIVE   Leukocytes,Ua MODERATE (A) NEGATIVE   RBC /  HPF 0-5 0 - 5 RBC/hpf   WBC, UA 0-5 0 - 5 WBC/hpf   Bacteria, UA RARE (A) NONE SEEN   Squamous Epithelial / LPF 0-5 0 - 5   Mucus PRESENT    Hyaline Casts, UA PRESENT     Comment: Performed at [facility], [address] [place], [place] [address]  CBC with Differential     Status: Abnormal   Collection Time: 01/23/20  4:14 PM  Result Value Ref Range   WBC 12.2 (H) 4.0 - 10.5 K/uL   RBC 4.08 3.87 - 5.11 MIL/uL   Hemoglobin 10.9 (L) 12.0 - 15.0 g/dL   HCT 64.4 (L) 36 - 46 %   MCV 81.1 80.0 - 100.0 fL   MCH 26.7 26.0 - 34.0 pg   MCHC 32.9 30.0 - 36.0 g/dL   RDW 03.4 (H) 74.2 - 59.5 %   Platelets 250 150 - 400 K/uL   nRBC 0.0 0.0 - 0.2 %   Neutrophils Relative % 88 %   Neutro Abs 10.7 (H) 1.7 - 7.7 K/uL   Lymphocytes Relative 5 %   Lymphs Abs 0.6 (L) 0.7 - 4.0 K/uL   Monocytes Relative 6 %   Monocytes Absolute 0.8 0 - 1 K/uL   Eosinophils Relative 0 %   Eosinophils Absolute 0.0 0 - 0 K/uL   Basophils Relative 0 %   Basophils Absolute 0.0 0 - 0 K/uL   Immature Granulocytes 1 %   Abs Immature Granulocytes 0.16 (H) 0.00 - 0.07 K/uL    Comment: Performed at [facility], [address], [place], [place] [address]  Comprehensive metabolic panel     Status: Abnormal   Collection Time: 01/23/20  4:14 PM  Result Value Ref Range   Sodium 138 135 - 145 mmol/L   Potassium 4.3 3.5 - 5.1 mmol/L   Chloride 104 98 - 111 mmol/L   CO2 22 22 - 32 mmol/L   Glucose, Bld 109 (H) 70 - 99 mg/dL    Comment: Glucose reference range applies only to samples taken after fasting for at least 8 hours.   BUN 8 8 - 23 mg/dL   Creatinine, Ser 6.43 0.44 - 1.00 mg/dL   Calcium 9.2 8.9 - 32.9 mg/dL   Total Protein 5.1 (L) 6.5 - 8.1 g/dL   Albumin 2.9 (L) 3.5 - 5.0 g/dL   AST 23 15 - 41 U/L   ALT 13 0 - 44 U/L   Alkaline Phosphatase 61 38 - 126 U/L   Total Bilirubin 1.6 (H) 0.3 - 1.2 mg/dL    GFR calc non Af Amer >60 >60 mL/min   GFR calc Af Amer >60 >60 mL/min   Anion gap 12 5 - 15    Comment: Performed at [facility], [address], [place], [place] [address]  Lipase, blood     Status: Abnormal   Collection Time: 01/23/20  4:14 PM  Result Value Ref Range   Lipase 120 (H) 11 - 51 U/L    Comment: Performed at [facility], [address] [place], [place] [address]  Lactic acid, plasma     Status: None   Collection Time: 01/23/20  4:14 PM  Result Value Ref Range   Lactic Acid, Venous 1.6 0.5 - 1.9 mmol/L    Comment: Performed at [facility], [address], [place], [place] [address]  Blood culture (routine x 2)     Status: None (Preliminary result)   Collection Time: 01/23/20  4:36 PM  Specimen: Left Antecubital; Blood  Result Value Ref Range   Specimen Description LEFT ANTECUBITAL    Special Requests      BOTTLES DRAWN AEROBIC AND ANAEROBIC Blood Culture adequate volume   Culture      NO GROWTH < 24 HOURS Performed at [facility], [address] [place], [place] [address]    Report Status PENDING   Blood culture (routine x 2)     Status: None (Preliminary result)   Collection Time: 01/23/20  4:36 PM   Specimen: Left Antecubital; Blood  Result Value Ref Range   Specimen Description LEFT ANTECUBITAL    Special Requests      BOTTLES DRAWN AEROBIC AND ANAEROBIC Blood Culture adequate volume   Culture      NO GROWTH < 24 HOURS Performed at [facility] [address], [place], [place] [address]    Report Status PENDING   TSH     Status: None   Collection Time: 01/23/20  4:40 PM  Result Value Ref Range   TSH 2.986 0.350 - 4.500 uIU/mL    Comment: Performed by a 3rd Generation assay with a functional sensitivity of <=0.01 uIU/mL. Performed at [facility], [address], [place], [place] [address]   SARS Coronavirus 2 by RT PCR (hospital order, performed in [facility] hospital lab) Nasopharyngeal Nasopharyngeal Swab     Status: None   Collection Time: 01/23/20   5:32 PM   Specimen: Nasopharyngeal Swab  Result Value Ref Range   SARS Coronavirus 2 NEGATIVE NEGATIVE    Comment: (NOTE) SARS-CoV-2 target nucleic acids are NOT DETECTED.  The SARS-CoV-2 RNA is generally detectable in upper and lower respiratory specimens during the acute phase of infection. The lowest concentration of SARS-CoV-2 viral copies this assay can detect is 250 copies / mL. A negative result does not preclude SARS-CoV-2 infection and should not be used as the sole basis for treatment or other patient management decisions.  A negative result may occur with improper specimen collection / handling, submission of specimen other than nasopharyngeal swab, presence of viral mutation(s) within the areas targeted by this assay, and inadequate number of viral copies (<250 copies / mL). A negative result must be combined with clinical observations, patient history, and epidemiological information.  Fact Sheet for Patients:   BoilerBrush.com.cy  Fact Sheet for Healthcare Providers: https://pope.com/  This test is not yet approved or  cleared by the [place] FDA and has been authorized for detection and/or diagnosis of SARS-CoV-2 by FDA under an Emergency Use Authorization (EUA).  This EUA will remain in effect (meaning this test can be used) for the duration of the COVID-19 declaration under Section 564(b)(1) of the Act, 21 U.S.C. section 360bbb-3(b)(1), unless the authorization is terminated or revoked sooner.  Performed at [facility], [address], [place], [place] [address]   HIV Antibody (routine testing w rflx)     Status: None   Collection Time: 01/24/20  6:13 AM  Result Value Ref Range   HIV Screen 4th Generation wRfx Non Reactive Non Reactive    Comment: Performed at [facility], [address] [place], [place] [address]  Hemoglobin A1c     Status: Abnormal   Collection Time: 01/24/20  6:13 AM  Result  Value Ref Range   Hgb A1c MFr Bld 5.9 (H) 4.8 - 5.6 %    Comment: (NOTE) Pre diabetes:          5.7%-6.4%  Diabetes:              >  6.4%  Glycemic control for   <7.0% adults with diabetes    Mean Plasma Glucose 122.63 mg/dL    Comment: Performed at [facility], [address] [place], [place] [address]  Comprehensive metabolic panel     Status: Abnormal   Collection Time: 01/24/20  6:13 AM  Result Value Ref Range   Sodium 138 135 - 145 mmol/L   Potassium 3.6 3.5 - 5.1 mmol/L   Chloride 107 98 - 111 mmol/L   CO2 25 22 - 32 mmol/L   Glucose, Bld 91 70 - 99 mg/dL    Comment: Glucose reference range applies only to samples taken after fasting for at least 8 hours.   BUN 9 8 - 23 mg/dL   Creatinine, Ser 6.04 0.44 - 1.00 mg/dL   Calcium 8.6 (L) 8.9 - 10.3 mg/dL   Total Protein 4.7 (L) 6.5 - 8.1 g/dL   Albumin 2.4 (L) 3.5 - 5.0 g/dL   AST 19 15 - 41 U/L   ALT 12 0 - 44 U/L   Alkaline Phosphatase 52 38 - 126 U/L   Total Bilirubin 1.3 (H) 0.3 - 1.2 mg/dL   GFR calc non Af Amer >60 >60 mL/min   GFR calc Af Amer >60 >60 mL/min   Anion gap 6 5 - 15    Comment: Performed at [facility], [address], [place], [place] [address]  CBC     Status: Abnormal   Collection Time: 01/24/20  6:13 AM  Result Value Ref Range   WBC 6.8 4.0 - 10.5 K/uL   RBC 3.59 (L) 3.87 - 5.11 MIL/uL   Hemoglobin 9.2 (L) 12.0 - 15.0 g/dL   HCT 11.9 (L) 36 - 46 %   MCV 82.2 80.0 - 100.0 fL   MCH 25.6 (L) 26.0 - 34.0 pg   MCHC 31.2 30.0 - 36.0 g/dL   RDW 14.7 (H) 82.9 - 56.2 %   Platelets 225 150 - 400 K/uL   nRBC 0.0 0.0 - 0.2 %    Comment: Performed at [facility], [address], [place], [place] [address]  Iron and TIBC     Status: Abnormal   Collection Time: 01/24/20  4:57 PM  Result Value Ref Range   Iron 37 28 - 170 ug/dL   TIBC 578 (L) 469 - 629 ug/dL   Saturation Ratios 28 10.4 - 31.8 %   UIBC 96 ug/dL    Comment: Performed at [facility], [address] [place], [place] [address]   Ferritin     Status: None   Collection Time: 01/24/20  4:57 PM  Result Value Ref Range   Ferritin 201 11 - 307 ng/mL    Comment: Performed at [facility], [address] [place], [place] [address]  Vitamin B12     Status: None   Collection Time: 01/24/20  4:57 PM  Result Value Ref Range   Vitamin B-12 562 180 - 914 pg/mL    Comment: (NOTE) This assay is not validated for testing neonatal or myeloproliferative syndrome specimens for Vitamin B12 levels. Performed at [facility], [address], [place], [place] [address]   Folate, serum, performed at [facility] lab     Status: Abnormal   Collection Time: 01/24/20  4:57 PM  Result Value Ref Range   Folate 5.8 (L) >5.9 ng/mL    Comment: Performed at [facility], [address], [place], [place] [address]    CT 12/2019- personally reviewed- hernia with omentum but  no bowel in the hernia, no signs of obstruction  Assessment & Plan:  Myrl Bynum is a 69 y.o. female with unintentional weight loss of 50 lbs, nausea/vomiting, poor intake, and diarrhea. 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 I am unable to reduce this and she has some minor tenderness.    Discussed that she does have a hernia and that she is tender. I do not however think her symptoms of weight loss, nausea/vomiting and diarrhea are likely related to the hernia that has no bowel in it. She could intermittently get bowel in the hernia and have some nausea/vomiting, but I would not think this would be sufficient to cause the weight loss etc .    Discussed the option of surgical repair given her discomfort and to get the hernia as a cause of the table. Discussed that Dr. Laural Golden also wanted a liver biopsy if possible but unsure if this could be done at the time of an open hernia repair given the location.   Discussed that we would have to hold her Eliquis and bridge her with lovenox.  Dr. Wynetta Emery is gone for the day and I have put in  request to pharmacy.   Will discuss further this weekend and get consent.   Discussed case with Dr. Laural Golden and my feelings that I doubt the hernia is driving the symptoms described above.   All questions were answered to the satisfaction of the patient.   Virl Cagey 01/24/2020, 8:34 PM

## 2020-01-24 NOTE — Progress Notes (Signed)
ANTICOAGULATION CONSULT NOTE - Follow Up Consult  Pharmacy Consult for Eliquis to Lovenox Indication: history of DVT / PE  Allergies  Allergen Reactions  . Ace Inhibitors Cough         Patient Measurements: Height: 5' 9.5" (176.5 cm) Weight: 69.5 kg (153 lb 3.5 oz) IBW/kg (Calculated) : 67.35  Vital Signs: Temp: 98.9 F (37.2 C) (06/11 2103) Temp Source: Oral (06/11 2103) BP: 128/73 (06/11 2103) Pulse Rate: 90 (06/11 2103)  Labs: Recent Labs    01/23/20 1614 01/24/20 0613  HGB 10.9* 9.2*  HCT 33.1* 29.5*  PLT 250 225  CREATININE 0.77 0.76    Estimated Creatinine Clearance: 69.6 mL/min (by C-G formula based on SCr of 0.76 mg/dL).   Assessment: 69 year old female on Eliquis prior to admission, last dose this evening, now on hold for planned hernia repair.  To begin Lovenox as bridge to surgery  Goal of Therapy:  Monitor platelets by anticoagulation protocol: Yes   Plan:  Lovenox 70 mg sq Q 12 hours to begin in AM Follow CBC  Thank you Okey Regal, PharmD  01/24/2020,9:42 PM

## 2020-01-24 NOTE — Consult Note (Addendum)
[name] Consult   Reason for Consult: Epigastric/ ventral hernia  Referring Physician:  Dr. Karilyn Cota   Chief Complaint    Emesis      HPI: Christina [name] is a 69 y.o. 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 She says that after her knee replacement 08/2019 that she has not wanted to eat or drink and that food takes bad.  She She has been see as an outpatient and had recommendations for possible EGD and colonoscopy, but these have not been completed. She comes to the hospital with the complaints of her diarrhea and generalized weakness.   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 She wants to get better to get back to work. Dr. Karilyn Cota has seen her and reports her last colonoscopy 2015.   She has mild transaminase elevation he thinks is from fatty liver, and he is worried about her hernia causing some of these symptoms.  He feels like she is up to date on her screening colonoscopy.   The patient notes the bulge in her upper abdomen and says it does get larger at times.    Past Medical History:  Diagnosis Date  . Allergic rhinitis   . Clotting disorder (HCC)   . DVT (deep venous thrombosis) (HCC) 1990's  . DVT (deep venous thrombosis) (HCC) 1980s   in the leg behind my knee;  right   . Epicondylitis    right   . GERD (gastroesophageal reflux disease)   . Hyperlipidemia   . Hypertension   . Osteoarthritis   . Pulmonary embolism (HCC) 1990's    Past Surgical History:  Procedure Laterality Date  . COLONOSCOPY  2015  . CYSTECTOMY     From shoulder and neck  . KNEE ARTHROSCOPY Left    Dr Ranell Patrick  . SKIN GRAFT     ulcer on right leg   . TOTAL KNEE ARTHROPLASTY Right 08/23/2019   Procedure: TOTAL KNEE ARTHROPLASTY;  Surgeon: Beverely Low, MD;  Location: [facility];  Service: Orthopedics;  Laterality:  Right;  . TUBAL LIGATION     Multiple    Family History  Problem Relation Age of Onset  . Heart attack Mother   . Deep vein thrombosis Mother   . Diabetes Mother   . Heart disease Mother   . Hyperlipidemia Mother   . Cancer Father   . Hyperlipidemia Sister   . Deep vein thrombosis Daughter   . Other Neg Hx        Clotting disorder    Social History   Tobacco Use  . Smoking status: Former Smoker    Quit date: 08/16/1979    Years since quitting: 40.4  . Smokeless tobacco: Never Used  . Tobacco comment: x33 years  Vaping Use  . Vaping Use: Never used  Substance Use Topics  . Alcohol use: No    Alcohol/week: 0.0 standard drinks  . Drug use: No    Medications: I have reviewed the patient's current medications. Current Facility-Administered Medications  Medication Dose Route Frequency Provider Last Rate Last Admin  . acetaminophen (TYLENOL) tablet 650 mg  650 mg Oral Q6H PRN Pieter Partridge, MD       Or  . acetaminophen (TYLENOL) suppository 650 mg  650 mg Rectal Q6H PRN Pieter Partridge, MD      . apixaban Everlene Balls) tablet  5 mg  5 mg Oral BID Leandro Reasonerhatterjee, Srobona Tublu, MD   5 mg at 01/24/20 2022  . diclofenac sodium (VOLTAREN) 1 % transdermal gel 2 g  2 g Topical TID PRN Leandro Reasonerhatterjee, Srobona Tublu, MD      . lactated ringers infusion   Intravenous Continuous Pieter Partridgehatterjee, Srobona Tublu, MD 125 mL/hr at 01/24/20 0536 New Bag at 01/24/20 0536  . loperamide (IMODIUM) capsule 2 mg  2 mg Oral QID PRN Leandro Reasonerhatterjee, Srobona Tublu, MD      . ondansetron [facility](ZOFRAN) tablet 4 mg  4 mg Oral Q6H PRN Pieter Partridgehatterjee, Srobona Tublu, MD       Or  . ondansetron [facility](ZOFRAN) injection 4 mg  4 mg Intravenous Q6H PRN Leandro Reasonerhatterjee, Srobona Tublu, MD      . pantoprazole (PROTONIX) EC tablet 40 mg  40 mg Oral Daily Leandro Reasonerhatterjee, Srobona Tublu, MD   40 mg at 01/24/20 0825   Allergies  Allergen Reactions  . Ace Inhibitors Cough          ROS:  A comprehensive review of systems was negative  except for: Gastrointestinal: positive for diarrhea, nausea, vomiting and poor appetite, poor po intake, some abdominal pain/ bulge with hernia  Blood pressure 120/81, pulse 85, temperature 98.9 F (37.2 C), temperature source Oral, resp. rate 16, height 5' 9.5" (1.765 m), weight 69.5 kg, SpO2 100 %. Physical Exam Vitals reviewed.  Constitutional:      General: She is not in acute distress. HENT:     Head: Normocephalic and atraumatic.     Nose: Nose normal.     Mouth/Throat:     Mouth: Mucous membranes are moist.  Eyes:     Extraocular Movements: Extraocular movements intact.  Cardiovascular:     Rate and Rhythm: Normal rate.  Pulmonary:     Effort: Pulmonary effort is normal.  Abdominal:     General: There is no distension.     Palpations: Abdomen is soft.     Tenderness: There is abdominal tenderness.     Hernia: A hernia is present.     Comments: incarceted epigastric hernia, tender over hernia, otherwise no tenderness   Musculoskeletal:        General: No swelling. Normal range of motion.     Cervical back: Normal range of motion.  Skin:    General: Skin is warm.  Neurological:     General: No focal deficit present.     Mental Status: She is alert and oriented to person, place, and time.  Psychiatric:        Mood and Affect: Mood normal.        Behavior: Behavior normal.        Thought Content: Thought content normal.        Judgment: Judgment normal.     Results: Results for orders placed or performed during the hospital encounter of 01/23/20 (from the past 48 hour(s))  Urinalysis, Routine w reflex microscopic     Status: Abnormal   Collection Time: 01/23/20  2:54 PM  Result Value Ref Range   Color, Urine YELLOW YELLOW   APPearance CLEAR CLEAR   Specific Gravity, Urine 1.011 1.005 - 1.030   pH 6.0 5.0 - 8.0   Glucose, UA NEGATIVE NEGATIVE mg/dL   Hgb urine dipstick SMALL (A) NEGATIVE   Bilirubin Urine NEGATIVE NEGATIVE   Ketones, ur 5 (A) NEGATIVE mg/dL    Protein, ur NEGATIVE NEGATIVE mg/dL   Nitrite NEGATIVE NEGATIVE   Leukocytes,Ua MODERATE (A) NEGATIVE   RBC /  HPF 0-5 0 - 5 RBC/hpf   WBC, UA 0-5 0 - 5 WBC/hpf   Bacteria, UA RARE (A) NONE SEEN   Squamous Epithelial / LPF 0-5 0 - 5   Mucus PRESENT    Hyaline Casts, UA PRESENT     Comment: Performed at [facility], [address] [place], [place] [address]  CBC with Differential     Status: Abnormal   Collection Time: 01/23/20  4:14 PM  Result Value Ref Range   WBC 12.2 (H) 4.0 - 10.5 K/uL   RBC 4.08 3.87 - 5.11 MIL/uL   Hemoglobin 10.9 (L) 12.0 - 15.0 g/dL   HCT 64.4 (L) 36 - 46 %   MCV 81.1 80.0 - 100.0 fL   MCH 26.7 26.0 - 34.0 pg   MCHC 32.9 30.0 - 36.0 g/dL   RDW 03.4 (H) 74.2 - 59.5 %   Platelets 250 150 - 400 K/uL   nRBC 0.0 0.0 - 0.2 %   Neutrophils Relative % 88 %   Neutro Abs 10.7 (H) 1.7 - 7.7 K/uL   Lymphocytes Relative 5 %   Lymphs Abs 0.6 (L) 0.7 - 4.0 K/uL   Monocytes Relative 6 %   Monocytes Absolute 0.8 0 - 1 K/uL   Eosinophils Relative 0 %   Eosinophils Absolute 0.0 0 - 0 K/uL   Basophils Relative 0 %   Basophils Absolute 0.0 0 - 0 K/uL   Immature Granulocytes 1 %   Abs Immature Granulocytes 0.16 (H) 0.00 - 0.07 K/uL    Comment: Performed at [facility], [address], [place], [place] [address]  Comprehensive metabolic panel     Status: Abnormal   Collection Time: 01/23/20  4:14 PM  Result Value Ref Range   Sodium 138 135 - 145 mmol/L   Potassium 4.3 3.5 - 5.1 mmol/L   Chloride 104 98 - 111 mmol/L   CO2 22 22 - 32 mmol/L   Glucose, Bld 109 (H) 70 - 99 mg/dL    Comment: Glucose reference range applies only to samples taken after fasting for at least 8 hours.   BUN 8 8 - 23 mg/dL   Creatinine, Ser 6.43 0.44 - 1.00 mg/dL   Calcium 9.2 8.9 - 32.9 mg/dL   Total Protein 5.1 (L) 6.5 - 8.1 g/dL   Albumin 2.9 (L) 3.5 - 5.0 g/dL   AST 23 15 - 41 U/L   ALT 13 0 - 44 U/L   Alkaline Phosphatase 61 38 - 126 U/L   Total Bilirubin 1.6 (H) 0.3 - 1.2 mg/dL    GFR calc non Af Amer >60 >60 mL/min   GFR calc Af Amer >60 >60 mL/min   Anion gap 12 5 - 15    Comment: Performed at [facility], [address], [place], [place] [address]  Lipase, blood     Status: Abnormal   Collection Time: 01/23/20  4:14 PM  Result Value Ref Range   Lipase 120 (H) 11 - 51 U/L    Comment: Performed at [facility], [address] [place], [place] [address]  Lactic acid, plasma     Status: None   Collection Time: 01/23/20  4:14 PM  Result Value Ref Range   Lactic Acid, Venous 1.6 0.5 - 1.9 mmol/L    Comment: Performed at [facility], [address], [place], [place] [address]  Blood culture (routine x 2)     Status: None (Preliminary result)   Collection Time: 01/23/20  4:36 PM  Specimen: Left Antecubital; Blood  Result Value Ref Range   Specimen Description LEFT ANTECUBITAL    Special Requests      BOTTLES DRAWN AEROBIC AND ANAEROBIC Blood Culture adequate volume   Culture      NO GROWTH < 24 HOURS Performed at [facility], [address] [place], [place] [address]    Report Status PENDING   Blood culture (routine x 2)     Status: None (Preliminary result)   Collection Time: 01/23/20  4:36 PM   Specimen: Left Antecubital; Blood  Result Value Ref Range   Specimen Description LEFT ANTECUBITAL    Special Requests      BOTTLES DRAWN AEROBIC AND ANAEROBIC Blood Culture adequate volume   Culture      NO GROWTH < 24 HOURS Performed at [facility] [address], [place], [place] [address]    Report Status PENDING   TSH     Status: None   Collection Time: 01/23/20  4:40 PM  Result Value Ref Range   TSH 2.986 0.350 - 4.500 uIU/mL    Comment: Performed by a 3rd Generation assay with a functional sensitivity of <=0.01 uIU/mL. Performed at [facility], [address], [place], [place] [address]   SARS Coronavirus 2 by RT PCR (hospital order, performed in [facility] hospital lab) Nasopharyngeal Nasopharyngeal Swab     Status: None   Collection Time: 01/23/20   5:32 PM   Specimen: Nasopharyngeal Swab  Result Value Ref Range   SARS Coronavirus 2 NEGATIVE NEGATIVE    Comment: (NOTE) SARS-CoV-2 target nucleic acids are NOT DETECTED.  The SARS-CoV-2 RNA is generally detectable in upper and lower respiratory specimens during the acute phase of infection. The lowest concentration of SARS-CoV-2 viral copies this assay can detect is 250 copies / mL. A negative result does not preclude SARS-CoV-2 infection and should not be used as the sole basis for treatment or other patient management decisions.  A negative result may occur with improper specimen collection / handling, submission of specimen other than nasopharyngeal swab, presence of viral mutation(s) within the areas targeted by this assay, and inadequate number of viral copies (<250 copies / mL). A negative result must be combined with clinical observations, patient history, and epidemiological information.  Fact Sheet for Patients:   BoilerBrush.com.cy  Fact Sheet for Healthcare Providers: https://pope.com/  This test is not yet approved or  cleared by the [place] FDA and has been authorized for detection and/or diagnosis of SARS-CoV-2 by FDA under an Emergency Use Authorization (EUA).  This EUA will remain in effect (meaning this test can be used) for the duration of the COVID-19 declaration under Section 564(b)(1) of the Act, 21 U.S.C. section 360bbb-3(b)(1), unless the authorization is terminated or revoked sooner.  Performed at [facility], [address], [place], [place] [address]   HIV Antibody (routine testing w rflx)     Status: None   Collection Time: 01/24/20  6:13 AM  Result Value Ref Range   HIV Screen 4th Generation wRfx Non Reactive Non Reactive    Comment: Performed at [facility], [address] [place], [place] [address]  Hemoglobin A1c     Status: Abnormal   Collection Time: 01/24/20  6:13 AM  Result  Value Ref Range   Hgb A1c MFr Bld 5.9 (H) 4.8 - 5.6 %    Comment: (NOTE) Pre diabetes:          5.7%-6.4%  Diabetes:              >  6.4%  Glycemic control for   <7.0% adults with diabetes    Mean Plasma Glucose 122.63 mg/dL    Comment: Performed at [facility], [address] [place], [place] [address]  Comprehensive metabolic panel     Status: Abnormal   Collection Time: 01/24/20  6:13 AM  Result Value Ref Range   Sodium 138 135 - 145 mmol/L   Potassium 3.6 3.5 - 5.1 mmol/L   Chloride 107 98 - 111 mmol/L   CO2 25 22 - 32 mmol/L   Glucose, Bld 91 70 - 99 mg/dL    Comment: Glucose reference range applies only to samples taken after fasting for at least 8 hours.   BUN 9 8 - 23 mg/dL   Creatinine, Ser 0.76 0.44 - 1.00 mg/dL   Calcium 8.6 (L) 8.9 - 10.3 mg/dL   Total Protein 4.7 (L) 6.5 - 8.1 g/dL   Albumin 2.4 (L) 3.5 - 5.0 g/dL   AST 19 15 - 41 U/L   ALT 12 0 - 44 U/L   Alkaline Phosphatase 52 38 - 126 U/L   Total Bilirubin 1.3 (H) 0.3 - 1.2 mg/dL   GFR calc non Af Amer >60 >60 mL/min   GFR calc Af Amer >60 >60 mL/min   Anion gap 6 5 - 15    Comment: Performed at [facility], [address] [place], [place] [address]  CBC     Status: Abnormal   Collection Time: 01/24/20  6:13 AM  Result Value Ref Range   WBC 6.8 4.0 - 10.5 K/uL   RBC 3.59 (L) 3.87 - 5.11 MIL/uL   Hemoglobin 9.2 (L) 12.0 - 15.0 g/dL   HCT 29.5 (L) 36 - 46 %   MCV 82.2 80.0 - 100.0 fL   MCH 25.6 (L) 26.0 - 34.0 pg   MCHC 31.2 30.0 - 36.0 g/dL   RDW 16.3 (H) 11.5 - 15.5 %   Platelets 225 150 - 400 K/uL   nRBC 0.0 0.0 - 0.2 %    Comment: Performed at [facility], [address] [place], [place] [address]  Iron and TIBC     Status: Abnormal   Collection Time: 01/24/20  4:57 PM  Result Value Ref Range   Iron 37 28 - 170 ug/dL   TIBC 133 (L) 250 - 450 ug/dL   Saturation Ratios 28 10.4 - 31.8 %   UIBC 96 ug/dL    Comment: Performed at [facility], [address] [place], [place] [address]   Ferritin     Status: None   Collection Time: 01/24/20  4:57 PM  Result Value Ref Range   Ferritin 201 11 - 307 ng/mL    Comment: Performed at [facility], [address] [place], [place] [address]  Vitamin B12     Status: None   Collection Time: 01/24/20  4:57 PM  Result Value Ref Range   Vitamin B-12 562 180 - 914 pg/mL    Comment: (NOTE) This assay is not validated for testing neonatal or myeloproliferative syndrome specimens for Vitamin B12 levels. Performed at [facility], [address] [place], [place] [address]   Folate, serum, performed at [facility] lab     Status: Abnormal   Collection Time: 01/24/20  4:57 PM  Result Value Ref Range   Folate 5.8 (L) >5.9 ng/mL    Comment: Performed at  Hospital, [address] , [place] [address]    CT 12/2019- personally reviewed- hernia with omentum but   no bowel in the hernia, no signs of obstruction  Assessment & Plan:  Christina [name] is a 69 y.o. female with unintentional weight loss of 50 lbs, nausea/vomiting, poor intake, and diarrhea. 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 I am unable to reduce this and she has some minor tenderness.    Discussed that she does have a hernia and that she is tender. I do not however think her symptoms of weight loss, nausea/vomiting and diarrhea are likely related to the hernia that has no bowel in it. She could intermittently get bowel in the hernia and have some nausea/vomiting, but I would not think this would be sufficient to cause the weight loss etc .    Discussed the option of surgical repair given her discomfort and to get the hernia as a cause of the table. Discussed that Dr. Laural Golden also wanted a liver biopsy if possible but unsure if this could be done at the time of an open hernia repair given the location.   Discussed that we would have to hold her Eliquis and bridge her with lovenox.  Dr. Wynetta Emery is gone for the day and I have put in  request to pharmacy.   Will discuss further this weekend and get consent.   Discussed case with Dr. Laural Golden and my feelings that I doubt the hernia is driving the symptoms described above.   All questions were answered to the satisfaction of the patient.   Christina [name] 01/24/2020, 8:34 PM

## 2020-01-25 DIAGNOSIS — I959 Hypotension, unspecified: Secondary | ICD-10-CM | POA: Diagnosis present

## 2020-01-25 DIAGNOSIS — R197 Diarrhea, unspecified: Secondary | ICD-10-CM | POA: Diagnosis not present

## 2020-01-25 DIAGNOSIS — K760 Fatty (change of) liver, not elsewhere classified: Secondary | ICD-10-CM | POA: Diagnosis present

## 2020-01-25 DIAGNOSIS — R112 Nausea with vomiting, unspecified: Secondary | ICD-10-CM | POA: Diagnosis not present

## 2020-01-25 DIAGNOSIS — R64 Cachexia: Secondary | ICD-10-CM | POA: Diagnosis present

## 2020-01-25 DIAGNOSIS — K219 Gastro-esophageal reflux disease without esophagitis: Secondary | ICD-10-CM | POA: Diagnosis present

## 2020-01-25 DIAGNOSIS — E860 Dehydration: Secondary | ICD-10-CM | POA: Diagnosis present

## 2020-01-25 DIAGNOSIS — Z808 Family history of malignant neoplasm of other organs or systems: Secondary | ICD-10-CM | POA: Diagnosis not present

## 2020-01-25 DIAGNOSIS — Z6821 Body mass index (BMI) 21.0-21.9, adult: Secondary | ICD-10-CM | POA: Diagnosis not present

## 2020-01-25 DIAGNOSIS — Z86711 Personal history of pulmonary embolism: Secondary | ICD-10-CM | POA: Diagnosis not present

## 2020-01-25 DIAGNOSIS — E538 Deficiency of other specified B group vitamins: Secondary | ICD-10-CM | POA: Diagnosis present

## 2020-01-25 DIAGNOSIS — Z8052 Family history of malignant neoplasm of bladder: Secondary | ICD-10-CM | POA: Diagnosis not present

## 2020-01-25 DIAGNOSIS — Z7901 Long term (current) use of anticoagulants: Secondary | ICD-10-CM | POA: Diagnosis not present

## 2020-01-25 DIAGNOSIS — R627 Adult failure to thrive: Secondary | ICD-10-CM | POA: Diagnosis present

## 2020-01-25 DIAGNOSIS — E43 Unspecified severe protein-calorie malnutrition: Secondary | ICD-10-CM | POA: Diagnosis present

## 2020-01-25 DIAGNOSIS — E785 Hyperlipidemia, unspecified: Secondary | ICD-10-CM | POA: Diagnosis present

## 2020-01-25 DIAGNOSIS — R634 Abnormal weight loss: Secondary | ICD-10-CM | POA: Diagnosis present

## 2020-01-25 DIAGNOSIS — Z833 Family history of diabetes mellitus: Secondary | ICD-10-CM | POA: Diagnosis not present

## 2020-01-25 DIAGNOSIS — Z96653 Presence of artificial knee joint, bilateral: Secondary | ICD-10-CM | POA: Diagnosis present

## 2020-01-25 DIAGNOSIS — Z20822 Contact with and (suspected) exposure to covid-19: Secondary | ICD-10-CM | POA: Diagnosis present

## 2020-01-25 DIAGNOSIS — I10 Essential (primary) hypertension: Secondary | ICD-10-CM | POA: Diagnosis present

## 2020-01-25 DIAGNOSIS — Z87891 Personal history of nicotine dependence: Secondary | ICD-10-CM | POA: Diagnosis not present

## 2020-01-25 DIAGNOSIS — Z8249 Family history of ischemic heart disease and other diseases of the circulatory system: Secondary | ICD-10-CM | POA: Diagnosis not present

## 2020-01-25 DIAGNOSIS — K436 Other and unspecified ventral hernia with obstruction, without gangrene: Secondary | ICD-10-CM | POA: Diagnosis present

## 2020-01-25 DIAGNOSIS — Z86718 Personal history of other venous thrombosis and embolism: Secondary | ICD-10-CM | POA: Diagnosis not present

## 2020-01-25 DIAGNOSIS — D638 Anemia in other chronic diseases classified elsewhere: Secondary | ICD-10-CM | POA: Diagnosis present

## 2020-01-25 DIAGNOSIS — Z83438 Family history of other disorder of lipoprotein metabolism and other lipidemia: Secondary | ICD-10-CM | POA: Diagnosis not present

## 2020-01-25 LAB — BASIC METABOLIC PANEL
Anion gap: 10 (ref 5–15)
BUN: 8 mg/dL (ref 8–23)
CO2: 25 mmol/L (ref 22–32)
Calcium: 9 mg/dL (ref 8.9–10.3)
Chloride: 106 mmol/L (ref 98–111)
Creatinine, Ser: 0.71 mg/dL (ref 0.44–1.00)
GFR calc Af Amer: >60 mL/min (ref >60)
GFR calc non Af Amer: >60 mL/min (ref >60)
Glucose, Bld: 74 mg/dL (ref 70–99)
Potassium: 3.8 mmol/L (ref 3.5–5.1)
Sodium: 141 mmol/L (ref 135–145)

## 2020-01-25 LAB — CBC
HCT: 30.8 % — ABNORMAL LOW (ref 36.0–46.0)
Hemoglobin: 10 g/dL — ABNORMAL LOW (ref 12.0–15.0)
MCH: 26.8 pg (ref 26.0–34.0)
MCHC: 32.5 g/dL (ref 30.0–36.0)
MCV: 82.6 fL (ref 80.0–100.0)
Platelets: 263 10*3/uL (ref 150–400)
RBC: 3.73 MIL/uL — ABNORMAL LOW (ref 3.87–5.11)
RDW: 16.2 % — ABNORMAL HIGH (ref 11.5–15.5)
WBC: 5.9 10*3/uL (ref 4.0–10.5)
nRBC: 0 % (ref 0.0–0.2)

## 2020-01-25 MED ORDER — ADULT MULTIVITAMIN W/MINERALS CH
1.0000 | ORAL_TABLET | Freq: Every day | ORAL | Status: DC
  Administered 2020-01-25 – 2020-01-26 (×2): 1 via ORAL
  Filled 2020-01-25 (×3): qty 1

## 2020-01-25 MED ORDER — ENSURE ENLIVE PO LIQD: 237.0000 mL | Freq: Three times a day (TID) | ORAL | Status: DC

## 2020-01-25 NOTE — Progress Notes (Signed)
Initial Nutrition Assessment  DOCUMENTATION CODES:   Not applicable (Suspect PCM)  INTERVENTION:    Ensure Enlive po TID, each supplement provides 350 kcal and 20 grams of protein  MVI daily   NUTRITION DIAGNOSIS:   Inadequate oral intake related to nausea, vomiting as evidenced by per patient/family report.  GOAL:   Patient will meet greater than or equal to 90% of their needs  MONITOR:   PO intake, Supplement acceptance, Weight trends, Labs, I & O's  REASON FOR ASSESSMENT:   Consult Assessment of nutrition requirement/status  ASSESSMENT:   Patient with PMH significant for HTN, chronic N/V/D, clotting disorder, and recent diagnosis of gastroenteritis. Presents this admission with nausea and vomiting. Found to have a hernia.   Plan possible hernia repair.   RD working remotely. Unable to reach pt by phone (attempted x2). Per H&P, pt has experienced nausea and vomiting since her knee replacement in January. Last meal completion charted as 100% this admission. RD to provide supplementation to maximize kcal and protein this admission.   Records indicate pt weighed 85.7 kg on 1/8 and 65.9 kg this admission (23% wt loss in 6 months, significant for time frame). Suspect severe malnutrition but unable to diagnose without NFPE and history.   Medications: reviewed Labs: reviewed   Diet Order:   Diet Order            Diet regular Room service appropriate? Yes; Fluid consistency: Thin  Diet effective now                 EDUCATION NEEDS:   Not appropriate for education at this time  Skin:  Skin Assessment: Reviewed RN Assessment  Last BM:  6/11  Height:   Ht Readings from Last 1 Encounters:  01/23/20 5' 9.5" (1.765 m)    Weight:   Wt Readings from Last 1 Encounters:  01/25/20 65.9 kg   BMI:  Body mass index is 21.15 kg/m.  Estimated Nutritional Needs:   Kcal:  1900-2100 kcal  Protein:  95-115 grams  Fluid:  >/= 1.9 L/day   Vanessa Kick RD,  LDN Clinical Nutrition Pager listed in AMION

## 2020-01-25 NOTE — Progress Notes (Signed)
[facility]  Eating. Says appetite better today. No complaints.  BP 103/68 (BP Location: Left Arm)   Pulse 92   Temp 98.6 F (37 C) (Oral)   Resp 16   Ht 5' 9.5" (1.765 m)   Wt 65.9 kg   LMP  (LMP Unknown)   SpO2 100%   BMI 21.15 kg/m  Soft abdomen, hernia present   Discussed plan for repair Monday but unsure if this is really driving symptoms. Patient agrees to repair. Will consent tomorrow.  lovenox for DVT/ PE history   Algis Greenhouse, MD [facility] [address] [place], [place] [address] [phone] (office)

## 2020-01-25 NOTE — Progress Notes (Signed)
Patient without complaints this afternoon.  Vital signs in last 24 hours: Temp:  [98.6 F (37 C)-98.9 F (37.2 C)] 98.6 F (37 C) (06/12 0537) Pulse Rate:  [85-92] 92 (06/12 0537) Resp:  [16] 16 (06/12 0537) BP: (103-128)/(68-81) 103/68 (06/12 0537) SpO2:  [100 %] 100 % (06/12 0537) Weight:  [65.9 kg] 65.9 kg (06/12 0537) Last BM Date: 01/24/20 General:   Alert,  , pleasant and cooperative in NAD Abdomen: Nondistended.  Patient does have a soft palpable mass which is fixed but movable and tender -arising from midline  upper abdomen  Intake/Output from previous day: 06/11 0701 - 06/12 0700 In: 1897.8 [I.V.:1897.8] Out: -  Intake/Output this shift: Total I/O In: 240 [P.O.:240] Out: -   Lab Results: Recent Labs    01/23/20 1614 01/24/20 0613 01/25/20 0714  WBC 12.2* 6.8 5.9  HGB 10.9* 9.2* 10.0*  HCT 33.1* 29.5* 30.8*  PLT 250 225 263   BMET Recent Labs    01/23/20 1614 01/24/20 0613 01/25/20 0714  NA 138 138 141  K 4.3 3.6 3.8  CL 104 107 106  CO2 22 25 25   GLUCOSE 109* 91 74  BUN 8 9 8   CREATININE 0.77 0.76 0.71  CALCIUM 9.2 8.6* 9.0   LFT Recent Labs    01/24/20 0613  PROT 4.7*  ALBUMIN 2.4*  AST 19  ALT 12  ALKPHOS 52  BILITOT 1.3*     Impression: Pleasant 69 year old lady with abdominal pain nausea, vomiting and 50 pound weight loss.  Palpable mass upper abdomen consistent with ventral hernia seen on recent CT imaging.  I have reviewed the CT films personally and discussed case with Dr. 03/25/20 yesterday. She has had significant symptoms related to the hernia.  However, it is unknown whether or not all of her presenting complaints are related to the presence of a ventral hernia.  Nothing on cross-sectional imaging to suggest another process such as mesenteric vascular compromise.  Recommendations: Fully agree with plans for ventral hernia repair the first of the week by Dr. 66.  I am hopeful surgery will relieve this lady's symptoms.   However, I told her that no one could issue her 100% guarantee that all of her symptoms would resolve with surgical repair of the hernia.  She seems to understand.

## 2020-01-25 NOTE — Progress Notes (Signed)
PROGRESS NOTE   Christina [name]  ZOX:[id number] DOB: 03/06/1951 DOA: 01/23/2020 PCP: Bennie Pierini, FNP   Chief Complaint  Patient presents with  . Emesis    Brief Admission History:  69 year old female with history of DVT, hypertension, hyperlipidemia has apparently been suffering with abnormal weight loss of about 40 pounds of unintentional weight loss over the last several months and chronic diarrhea.  She was supposed to have an outpatient EGD and colonoscopy but has not been completed as of this time.  She has been taking Cipro and Flagyl and reports that she completed that after being diagnosed with an ileitis.  Her main complaint was diarrheal illness and generalized weakness.  Assessment & Plan:   Principal Problem:   Weight loss, unintentional Active Problems:   Essential hypertension, benign   Unexplained weight loss   Leukocytosis-resolved with supportive therapy.  Diarrheal illness-no BM since admission.  Continue supportive care.  Unintentional weight loss/abnormal weight loss-GI following see consult notes.  Epigastric hernia - Surgery planning to take patient to OR on Monday.    Mild dehydration-treating with gentle IV fluids.  Hypoalbuminemia- dietitian consultation appreciated.  She is eating better now.  Hyperbilirubinemia-trending down with IV fluids.  Anemia-slightly decreased folic acid, otherwise fairly normal indices.   Coagulopathy-heparin bridge in preparation for surgery 6/14  DVT prophylaxis: heparin IV  Code Status: Full Family Communication: bedside update Disposition:   Status is: Observation  Dispo: The patient is from: The patient remains OBS appropriate and will d/c before 2 midnights.              Anticipated d/c is to: Home              Anticipated d/c date is: 2-3 days pending surgery sign off              Patient currently is not medically stable to d/c.  Consultants:   GI  surgery  Procedures:      Antimicrobials:     Subjective: Patient reports she feels much better, she has been eating and drinking.  No BM so far.   Objective: Vitals:   01/23/20 2200 01/24/20 0215 01/24/20 0547 01/24/20 0558  BP: 116/72 117/74 104/61   Pulse: 93 86 93   Resp: 16 16 16    Temp: 98.2 F (36.8 C) 97.8 F (36.6 C) 98.7 F (37.1 C)   TempSrc: Oral Oral Oral   SpO2: 100% 99% 100%   Weight:    69.5 kg  Height:       No intake or output data in the 24 hours ending 01/24/20 1435 Filed Weights   01/23/20 1324 01/24/20 0558  Weight: 68 kg 69.5 kg    Examination:  General exam: Appears calm and comfortable  Respiratory system: Clear to auscultation. Respiratory effort normal. Cardiovascular system: S1 & S2 heard, RRR. No JVD, murmurs, rubs, gallops or clicks. No pedal edema. Gastrointestinal system: Abdomen is nondistended, soft and nontender. No organomegaly or masses felt. Normal bowel sounds heard. Central nervous system: Alert and oriented. No focal neurological deficits. Extremities: Symmetric 5 x 5 power. Skin: No rashes, lesions or ulcers Psychiatry: Judgement and insight appear normal. Mood & affect appropriate.   Data Reviewed: I have personally reviewed following labs and imaging studies  CBC: Recent Labs  Lab 01/23/20 1614 01/24/20 0613  WBC 12.2* 6.8  NEUTROABS 10.7*  --   HGB 10.9* 9.2*  HCT 33.1* 29.5*  MCV 81.1 82.2  PLT 250 225    Basic Metabolic Panel:  Recent Labs  Lab 01/23/20 1614 01/24/20 0613  NA 138 138  K 4.3 3.6  CL 104 107  CO2 22 25  GLUCOSE 109* 91  BUN 8 9  CREATININE 0.77 0.76  CALCIUM 9.2 8.6*    GFR: Estimated Creatinine Clearance: 69.6 mL/min (by C-G formula based on SCr of 0.76 mg/dL).  Liver Function Tests: Recent Labs  Lab 01/23/20 1614 01/24/20 0613  AST 23 19  ALT 13 12  ALKPHOS 61 52  BILITOT 1.6* 1.3*  PROT 5.1* 4.7*  ALBUMIN 2.9* 2.4*    CBG: No results for input(s): GLUCAP in the last 168 hours.  Recent  Results (from the past 240 hour(s))  Blood culture (routine x 2)     Status: None (Preliminary result)   Collection Time: 01/23/20  4:36 PM   Specimen: Left Antecubital; Blood  Result Value Ref Range Status   Specimen Description LEFT ANTECUBITAL  Final   Special Requests   Final    BOTTLES DRAWN AEROBIC AND ANAEROBIC Blood Culture adequate volume   Culture   Final    NO GROWTH < 24 HOURS Performed at [facility], [address] [place], [place] [address]    Report Status PENDING  Incomplete  Blood culture (routine x 2)     Status: None (Preliminary result)   Collection Time: 01/23/20  4:36 PM   Specimen: Left Antecubital; Blood  Result Value Ref Range Status   Specimen Description LEFT ANTECUBITAL  Final   Special Requests   Final    BOTTLES DRAWN AEROBIC AND ANAEROBIC Blood Culture adequate volume   Culture   Final    NO GROWTH < 24 HOURS Performed at [facility], [address] [place], [place] [address]    Report Status PENDING  Incomplete  SARS Coronavirus 2 by RT PCR (hospital order, performed in [facility] hospital lab) Nasopharyngeal Nasopharyngeal Swab     Status: None   Collection Time: 01/23/20  5:32 PM   Specimen: Nasopharyngeal Swab  Result Value Ref Range Status   SARS Coronavirus 2 NEGATIVE NEGATIVE Final    Comment: (NOTE) SARS-CoV-2 target nucleic acids are NOT DETECTED.  The SARS-CoV-2 RNA is generally detectable in upper and lower respiratory specimens during the acute phase of infection. The lowest concentration of SARS-CoV-2 viral copies this assay can detect is 250 copies / mL. A negative result does not preclude SARS-CoV-2 infection and should not be used as the sole basis for treatment or other patient management decisions.  A negative result may occur with improper specimen collection / handling, submission of specimen other than nasopharyngeal swab, presence of viral mutation(s) within the areas targeted by this assay, and inadequate number of  viral copies (<250 copies / mL). A negative result must be combined with clinical observations, patient history, and epidemiological information.  Fact Sheet for Patients:   StrictlyIdeas.no  Fact Sheet for Healthcare Providers: BankingDealers.co.za  This test is not yet approved or  cleared by the [place] FDA and has been authorized for detection and/or diagnosis of SARS-CoV-2 by FDA under an Emergency Use Authorization (EUA).  This EUA will remain in effect (meaning this test can be used) for the duration of the COVID-19 declaration under Section 564(b)(1) of the Act, 21 U.S.C. section 360bbb-3(b)(1), unless the authorization is terminated or revoked sooner.  Performed at [facility], [address] [place],  [address]      Radiology Studies: No results found.   Scheduled Meds: . apixaban  5 mg Oral  BID  . pantoprazole  40 mg Oral Daily   Continuous Infusions: . lactated ringers 125 mL/hr at 01/24/20 0536    LOS: 0 days   Time spent: 17 mins    Kaitrin Seybold Laural Benes, MD How to contact the [facility] Attending or Consulting provider 7A - 7P or covering provider during after hours 7P -7A, for this patient?  1. Check the care team in [facility] and look for a) attending/consulting TRH provider listed and b) the [facility] team listed 2. Log into [email or web address] and use [place]'s universal password to access. If you do not have the password, please contact the hospital operator. 3. Locate the [facility] provider you are looking for under [facility] and page to a number that you can be directly reached. 4. If you still have difficulty reaching the provider, please page the [facility] (Director on Call) for the Hospitalists listed on amion for assistance.  01/24/2020, 2:35 PM

## 2020-01-26 DIAGNOSIS — R112 Nausea with vomiting, unspecified: Secondary | ICD-10-CM

## 2020-01-26 DIAGNOSIS — R197 Diarrhea, unspecified: Secondary | ICD-10-CM

## 2020-01-26 DIAGNOSIS — K436 Other and unspecified ventral hernia with obstruction, without gangrene: Principal | ICD-10-CM

## 2020-01-26 LAB — CBC
HCT: 35 % — ABNORMAL LOW (ref 36.0–46.0)
Hemoglobin: 11.1 g/dL — ABNORMAL LOW (ref 12.0–15.0)
MCH: 26.2 pg (ref 26.0–34.0)
MCHC: 31.7 g/dL (ref 30.0–36.0)
MCV: 82.7 fL (ref 80.0–100.0)
Platelets: 341 10*3/uL (ref 150–400)
RBC: 4.23 MIL/uL (ref 3.87–5.11)
RDW: 16.4 % — ABNORMAL HIGH (ref 11.5–15.5)
WBC: 6.5 10*3/uL (ref 4.0–10.5)
nRBC: 0 % (ref 0.0–0.2)

## 2020-01-26 LAB — BASIC METABOLIC PANEL
Anion gap: 8 (ref 5–15)
BUN: 7 mg/dL — ABNORMAL LOW (ref 8–23)
CO2: 25 mmol/L (ref 22–32)
Calcium: 8.8 mg/dL — ABNORMAL LOW (ref 8.9–10.3)
Chloride: 103 mmol/L (ref 98–111)
Creatinine, Ser: 0.72 mg/dL (ref 0.44–1.00)
GFR calc Af Amer: >60 mL/min (ref >60)
GFR calc non Af Amer: >60 mL/min (ref >60)
Glucose, Bld: 82 mg/dL (ref 70–99)
Potassium: 3.9 mmol/L (ref 3.5–5.1)
Sodium: 136 mmol/L (ref 135–145)

## 2020-01-26 LAB — SURGICAL PCR SCREEN
MRSA, PCR: NEGATIVE
Staphylococcus aureus: POSITIVE — AB

## 2020-01-26 MED ORDER — CHLORHEXIDINE GLUCONATE CLOTH 2 % EX PADS: 6.0000 | MEDICATED_PAD | Freq: Once | CUTANEOUS | Status: AC

## 2020-01-26 MED ORDER — ENOXAPARIN SODIUM 60 MG/0.6ML ~~LOC~~ SOLN
60.0000 mg | Freq: Two times a day (BID) | SUBCUTANEOUS | Status: DC
  Administered 2020-01-26: 60 mg via SUBCUTANEOUS

## 2020-01-26 MED ORDER — ENOXAPARIN SODIUM 60 MG/0.6ML ~~LOC~~ SOLN: 60.0000 mg | Freq: Two times a day (BID) | SUBCUTANEOUS | Status: DC

## 2020-01-26 MED ORDER — CHLORHEXIDINE GLUCONATE CLOTH 2 % EX PADS
6.0000 | MEDICATED_PAD | Freq: Once | CUTANEOUS | Status: AC
  Administered 2020-01-26: 6 via TOPICAL

## 2020-01-26 MED ORDER — CEFAZOLIN SODIUM-DEXTROSE 2-4 GM/100ML-% IV SOLN
2.0000 g | INTRAVENOUS | Status: AC
  Administered 2020-01-27: 2 g via INTRAVENOUS
  Filled 2020-01-26: qty 100

## 2020-01-26 MED ORDER — CHLORHEXIDINE GLUCONATE CLOTH 2 % EX PADS
6.0000 | MEDICATED_PAD | Freq: Every day | CUTANEOUS | Status: DC
  Administered 2020-01-27 – 2020-01-28 (×2): 6 via TOPICAL

## 2020-01-26 MED ORDER — MUPIROCIN 2 % EX OINT
1.0000 "application " | TOPICAL_OINTMENT | Freq: Two times a day (BID) | CUTANEOUS | Status: DC
  Administered 2020-01-26 – 2020-01-28 (×4): 1 via NASAL
  Filled 2020-01-26 (×2): qty 22

## 2020-01-26 NOTE — Progress Notes (Signed)
CRITICAL VALUE ALERT  Critical Value: Surgical PCR positive for staph  Date & Time Notied:  01/25/2020  1720  Provider Notified: Dr. Laural Benes  Orders Received/Actions taken: yes

## 2020-01-26 NOTE — Progress Notes (Signed)
PROGRESS NOTE   Christina [name]  DJS:[id number] DOB: August 28, 1950 DOA: 01/23/2020 PCP: Chevis Pretty, FNP   Chief Complaint  Patient presents with  . Emesis   Brief Admission History:  69 year old female with history of DVT, hypertension, hyperlipidemia has apparently been suffering with abnormal weight loss of about 40 pounds of unintentional weight loss over the last several months and chronic diarrhea.  She was supposed to have an outpatient EGD and colonoscopy but has not been completed as of this time.  She has been taking Cipro and Flagyl and reports that she completed that after being diagnosed with an ileitis.  Her main complaint was diarrheal illness and generalized weakness.  Assessment & Plan:   Principal Problem:   Weight loss, unintentional Active Problems:   Essential hypertension, benign   Unexplained weight loss   Leukocytosis-resolved with supportive therapy.  Diarrheal illness-no BM since admission.  Continue supportive care.  Unintentional weight loss/abnormal weight loss-GI following see consult notes.  Epigastric hernia - Surgery planning to take patient to OR on Monday.    Mild dehydration-treating with gentle IV fluids.  Hypoalbuminemia- dietitian consultation appreciated.  She is eating better now.  Hyperbilirubinemia-trending down with IV fluids.  Anemia-slightly decreased folic acid, otherwise fairly normal indices.   Coagulopathy-heparin bridge in preparation for surgery 6/14  DVT prophylaxis: heparin IV  Code Status: Full Family Communication: bedside update Disposition:   Status is: Inpatient   Dispo: The patient is from: The patient remains INP appropriate.              Anticipated d/c is to: Home              Anticipated d/c date is: 2 days pending surgery sign off.  Pt going to OR 6/14 for hernia repair              Patient currently is not medically stable to d/c.  Consultants:   GI  surgery  Procedures:      Antimicrobials:     Subjective: Patient reports she feels much better, she has been eating and drinking.  No BM so far.   Objective: Vitals:   01/23/20 2200 01/24/20 0215 01/24/20 0547 01/24/20 0558  BP: 116/72 117/74 104/61   Pulse: 93 86 93   Resp: 16 16 16    Temp: 98.2 F (36.8 C) 97.8 F (36.6 C) 98.7 F (37.1 C)   TempSrc: Oral Oral Oral   SpO2: 100% 99% 100%   Weight:    69.5 kg  Height:       No intake or output data in the 24 hours ending 01/24/20 1435 Filed Weights   01/23/20 1324 01/24/20 0558  Weight: 68 kg 69.5 kg    Examination:  General exam: Appears calm and comfortable  Respiratory system: Clear to auscultation. Respiratory effort normal. Cardiovascular system: S1 & S2 heard, RRR. No JVD, murmurs, rubs, gallops or clicks. No pedal edema. Gastrointestinal system: Abdomen is nondistended, soft and nontender. No organomegaly or masses felt. Normal bowel sounds heard. Central nervous system: Alert and oriented. No focal neurological deficits. Extremities: Symmetric 5 x 5 power. Skin: No rashes, lesions or ulcers Psychiatry: Judgement and insight appear normal. Mood & affect appropriate.   Data Reviewed: I have personally reviewed following labs and imaging studies  CBC: Recent Labs  Lab 01/23/20 1614 01/24/20 0613  WBC 12.2* 6.8  NEUTROABS 10.7*  --   HGB 10.9* 9.2*  HCT 33.1* 29.5*  MCV 81.1 82.2  PLT 250 225  Basic Metabolic Panel: Recent Labs  Lab 01/23/20 1614 01/24/20 0613  NA 138 138  K 4.3 3.6  CL 104 107  CO2 22 25  GLUCOSE 109* 91  BUN 8 9  CREATININE 0.77 0.76  CALCIUM 9.2 8.6*    GFR: Estimated Creatinine Clearance: 69.6 mL/min (by C-G formula based on SCr of 0.76 mg/dL).  Liver Function Tests: Recent Labs  Lab 01/23/20 1614 01/24/20 0613  AST 23 19  ALT 13 12  ALKPHOS 61 52  BILITOT 1.6* 1.3*  PROT 5.1* 4.7*  ALBUMIN 2.9* 2.4*    CBG: No results for input(s): GLUCAP in the last 168 hours.  Recent  Results (from the past 240 hour(s))  Blood culture (routine x 2)     Status: None (Preliminary result)   Collection Time: 01/23/20  4:36 PM   Specimen: Left Antecubital; Blood  Result Value Ref Range Status   Specimen Description LEFT ANTECUBITAL  Final   Special Requests   Final    BOTTLES DRAWN AEROBIC AND ANAEROBIC Blood Culture adequate volume   Culture   Final    NO GROWTH < 24 HOURS Performed at [facility], [address] [place], [place] [address]    Report Status PENDING  Incomplete  Blood culture (routine x 2)     Status: None (Preliminary result)   Collection Time: 01/23/20  4:36 PM   Specimen: Left Antecubital; Blood  Result Value Ref Range Status   Specimen Description LEFT ANTECUBITAL  Final   Special Requests   Final    BOTTLES DRAWN AEROBIC AND ANAEROBIC Blood Culture adequate volume   Culture   Final    NO GROWTH < 24 HOURS Performed at [facility], [address] [place], [place] [address]    Report Status PENDING  Incomplete  SARS Coronavirus 2 by RT PCR (hospital order, performed in [facility] hospital lab) Nasopharyngeal Nasopharyngeal Swab     Status: None   Collection Time: 01/23/20  5:32 PM   Specimen: Nasopharyngeal Swab  Result Value Ref Range Status   SARS Coronavirus 2 NEGATIVE NEGATIVE Final    Comment: (NOTE) SARS-CoV-2 target nucleic acids are NOT DETECTED.  The SARS-CoV-2 RNA is generally detectable in upper and lower respiratory specimens during the acute phase of infection. The lowest concentration of SARS-CoV-2 viral copies this assay can detect is 250 copies / mL. A negative result does not preclude SARS-CoV-2 infection and should not be used as the sole basis for treatment or other patient management decisions.  A negative result may occur with improper specimen collection / handling, submission of specimen other than nasopharyngeal swab, presence of viral mutation(s) within the areas targeted by this assay, and inadequate number of  viral copies (<250 copies / mL). A negative result must be combined with clinical observations, patient history, and epidemiological information.  Fact Sheet for Patients:   BoilerBrush.com.cy  Fact Sheet for Healthcare Providers: https://pope.com/  This test is not yet approved or  cleared by the [place] FDA and has been authorized for detection and/or diagnosis of SARS-CoV-2 by FDA under an Emergency Use Authorization (EUA).  This EUA will remain in effect (meaning this test can be used) for the duration of the COVID-19 declaration under Section 564(b)(1) of the Act, 21 U.S.C. section 360bbb-3(b)(1), unless the authorization is terminated or revoked sooner.  Performed at [facility], [address], [place], [place] [address]      Radiology Studies: No results found.   Scheduled Meds: . apixaban  5 mg Oral BID  . pantoprazole  40 mg Oral Daily   Continuous Infusions: . lactated ringers 125 mL/hr at 01/24/20 0536    LOS: 0 days   Time spent: 16 mins    Abbygail Willhoite Laural Benes, MD How to contact the [facility] Attending or Consulting provider 7A - 7P or covering provider during after hours 7P -7A, for this patient?  1. Check the care team in [facility] and look for a) attending/consulting TRH provider listed and b) the [facility] team listed 2. Log into [email or web address] and use [place]'s universal password to access. If you do not have the password, please contact the hospital operator. 3. Locate the [facility] provider you are looking for under [facility] and page to a number that you can be directly reached. 4. If you still have difficulty reaching the provider, please page the [facility] (Director on Call) for the Hospitalists listed on amion for assistance.  01/24/2020, 2:35 PM

## 2020-01-26 NOTE — Progress Notes (Signed)
[facility] Progress Note     Subjective: Doing fair. Appetite improving. Tender over hernia.   Objective: Vital signs in last 24 hours: Temp:  [98 F (36.7 C)-99 F (37.2 C)] 98 F (36.7 C) (06/13 1405) Pulse Rate:  [86-92] 92 (06/13 1405) Resp:  [18-20] 18 (06/13 1405) BP: (96-139)/(75-89) 130/89 (06/13 1405) SpO2:  [100 %] 100 % (06/13 1405) Weight:  [61.4 kg] 61.4 kg (06/13 0738) Last BM Date: 01/24/20  Intake/Output from previous day: 06/12 0701 - 06/13 0700 In: 480 [P.O.:480] Out: -  Intake/Output this shift: No intake/output data recorded.  General appearance: alert, cooperative and no distress Resp: normal work of breathing GI: soft, nondistended, tender over incarcerated hernia in epigastric region  Lab Results:  Recent Labs    01/25/20 0714 01/26/20 0844  WBC 5.9 6.5  HGB 10.0* 11.1*  HCT 30.8* 35.0*  PLT 263 341   BMET Recent Labs    01/25/20 0714 01/26/20 0844  NA 141 136  K 3.8 3.9  CL 106 103  CO2 25 25  GLUCOSE 74 82  BUN 8 7*  CREATININE 0.71 0.72  CALCIUM 9.0 8.8*    Assessment/Plan: Christina [name] is a 69 yo with an incarcerated epigastric hernia. I doubt this is causing the majority of her symptoms and have told her this. Discussed surgery tomorrow with hernia repair with mesh and possible liver biopsy and risk of bleeding, infection, use of mesh, injury to bowel, and recurrence.  -NPO midnight -EKG today -Preop orders in place    LOS: 1 day    Christina [name] 01/26/2020

## 2020-01-27 ENCOUNTER — Encounter (HOSPITAL_COMMUNITY): Payer: Self-pay | Admitting: Family Medicine

## 2020-01-27 ENCOUNTER — Inpatient Hospital Stay (HOSPITAL_COMMUNITY): Payer: BC Managed Care – PPO | Admitting: Anesthesiology

## 2020-01-27 ENCOUNTER — Encounter (HOSPITAL_COMMUNITY)
Admission: AD | Disposition: A | Discharge status: Home / Self Care | Payer: Self-pay | Source: Home / Self Care | Attending: Family Medicine

## 2020-01-27 ENCOUNTER — Ambulatory Visit: Payer: BC Managed Care – PPO | Admitting: Nurse Practitioner

## 2020-01-27 ENCOUNTER — Other Ambulatory Visit: Payer: Self-pay

## 2020-01-27 HISTORY — PX: UMBILICAL HERNIA REPAIR: SHX196

## 2020-01-27 LAB — CBC
HCT: 31.8 % — ABNORMAL LOW (ref 36.0–46.0)
Hemoglobin: 10.1 g/dL — ABNORMAL LOW (ref 12.0–15.0)
MCH: 26.4 pg (ref 26.0–34.0)
MCHC: 31.8 g/dL (ref 30.0–36.0)
MCV: 83 fL (ref 80.0–100.0)
Platelets: 298 10*3/uL (ref 150–400)
RBC: 3.83 MIL/uL — ABNORMAL LOW (ref 3.87–5.11)
RDW: 16.4 % — ABNORMAL HIGH (ref 11.5–15.5)
WBC: 5.2 10*3/uL (ref 4.0–10.5)
nRBC: 0 % (ref 0.0–0.2)

## 2020-01-27 LAB — BASIC METABOLIC PANEL
Anion gap: 8 (ref 5–15)
BUN: 5 mg/dL — ABNORMAL LOW (ref 8–23)
CO2: 27 mmol/L (ref 22–32)
Calcium: 8.8 mg/dL — ABNORMAL LOW (ref 8.9–10.3)
Chloride: 104 mmol/L (ref 98–111)
Creatinine, Ser: 0.66 mg/dL (ref 0.44–1.00)
GFR calc Af Amer: >60 mL/min (ref >60)
GFR calc non Af Amer: >60 mL/min (ref >60)
Glucose, Bld: 90 mg/dL (ref 70–99)
Potassium: 3.8 mmol/L (ref 3.5–5.1)
Sodium: 139 mmol/L (ref 135–145)

## 2020-01-27 LAB — PROTIME-INR
INR: 1.3 — ABNORMAL HIGH (ref 0.8–1.2)
Prothrombin Time: 15.5 seconds — ABNORMAL HIGH (ref 11.4–15.2)

## 2020-01-27 SURGERY — REPAIR, HERNIA, UMBILICAL, ADULT
Anesthesia: General | Site: Abdomen

## 2020-01-27 MED ORDER — PRENATAL MULTIVITAMIN CH
1.0000 | ORAL_TABLET | Freq: Every day | ORAL | Status: DC
  Filled 2020-01-27 (×2): qty 1

## 2020-01-27 MED ORDER — LIDOCAINE HCL (CARDIAC) PF 50 MG/5ML IV SOSY
PREFILLED_SYRINGE | INTRAVENOUS | Status: DC | PRN
  Administered 2020-01-27: 100 mg via INTRAVENOUS

## 2020-01-27 MED ORDER — DEXAMETHASONE SODIUM PHOSPHATE 10 MG/ML IJ SOLN
INTRAMUSCULAR | Status: DC | PRN
  Administered 2020-01-27: 4 mg via INTRAVENOUS

## 2020-01-27 MED ORDER — SUCCINYLCHOLINE CHLORIDE 200 MG/10ML IV SOSY
PREFILLED_SYRINGE | INTRAVENOUS | Status: AC
  Filled 2020-01-27: qty 10

## 2020-01-27 MED ORDER — SODIUM CHLORIDE 0.9 % IR SOLN
Status: DC | PRN
  Administered 2020-01-27: 1000 mL

## 2020-01-27 MED ORDER — SUGAMMADEX SODIUM 500 MG/5ML IV SOLN
INTRAVENOUS | Status: DC | PRN
  Administered 2020-01-27: 200 mg via INTRAVENOUS

## 2020-01-27 MED ORDER — CHLORHEXIDINE GLUCONATE 0.12 % MT SOLN
15.0000 mL | Freq: Once | OROMUCOSAL | Status: AC
  Administered 2020-01-27: 15 mL via OROMUCOSAL

## 2020-01-27 MED ORDER — OXYCODONE HCL 5 MG PO TABS: 5.0000 mg | ORAL_TABLET | ORAL | Status: DC | PRN

## 2020-01-27 MED ORDER — MIDAZOLAM HCL 2 MG/2ML IJ SOLN
INTRAMUSCULAR | Status: DC | PRN
  Administered 2020-01-27: 2 mg via INTRAVENOUS

## 2020-01-27 MED ORDER — ONDANSETRON HCL 4 MG/2ML IJ SOLN: 4.0000 mg | Freq: Once | INTRAMUSCULAR | Status: DC | PRN

## 2020-01-27 MED ORDER — BUPIVACAINE LIPOSOME 1.3 % IJ SUSP
INTRAMUSCULAR | Status: AC
  Filled 2020-01-27: qty 20

## 2020-01-27 MED ORDER — ALUM & MAG HYDROXIDE-SIMETH 200-200-20 MG/5ML PO SUSP
30.0000 mL | Freq: Four times a day (QID) | ORAL | Status: DC | PRN
  Administered 2020-01-27: 30 mL via ORAL
  Filled 2020-01-27: qty 30

## 2020-01-27 MED ORDER — DEXAMETHASONE SODIUM PHOSPHATE 10 MG/ML IJ SOLN
INTRAMUSCULAR | Status: AC
  Filled 2020-01-27: qty 1

## 2020-01-27 MED ORDER — FENTANYL CITRATE (PF) 250 MCG/5ML IJ SOLN
INTRAMUSCULAR | Status: AC
  Filled 2020-01-27: qty 5

## 2020-01-27 MED ORDER — ENOXAPARIN SODIUM 60 MG/0.6ML ~~LOC~~ SOLN
60.0000 mg | Freq: Two times a day (BID) | SUBCUTANEOUS | Status: DC
  Administered 2020-01-28: 60 mg via SUBCUTANEOUS
  Filled 2020-01-27: qty 0.6

## 2020-01-27 MED ORDER — MIDAZOLAM HCL 2 MG/2ML IJ SOLN
INTRAMUSCULAR | Status: AC
  Filled 2020-01-27: qty 2

## 2020-01-27 MED ORDER — MORPHINE SULFATE (PF) 2 MG/ML IV SOLN
2.0000 mg | INTRAVENOUS | Status: DC | PRN
  Administered 2020-01-27: 2 mg via INTRAVENOUS
  Filled 2020-01-27: qty 1

## 2020-01-27 MED ORDER — ONDANSETRON HCL 4 MG/2ML IJ SOLN
INTRAMUSCULAR | Status: DC | PRN
  Administered 2020-01-27: 4 mg via INTRAVENOUS

## 2020-01-27 MED ORDER — SUGAMMADEX SODIUM 500 MG/5ML IV SOLN
INTRAVENOUS | Status: AC
  Filled 2020-01-27: qty 5

## 2020-01-27 MED ORDER — PROPOFOL 10 MG/ML IV BOLUS
INTRAVENOUS | Status: DC | PRN
  Administered 2020-01-27: 100 mg via INTRAVENOUS

## 2020-01-27 MED ORDER — BUPIVACAINE LIPOSOME 1.3 % IJ SUSP
INTRAMUSCULAR | Status: DC | PRN
  Administered 2020-01-27: 20 mL

## 2020-01-27 MED ORDER — FENTANYL CITRATE (PF) 100 MCG/2ML IJ SOLN: 25.0000 ug | INTRAMUSCULAR | Status: DC | PRN

## 2020-01-27 MED ORDER — FENTANYL CITRATE (PF) 100 MCG/2ML IJ SOLN
INTRAMUSCULAR | Status: DC | PRN
  Administered 2020-01-27: 100 ug via INTRAVENOUS
  Administered 2020-01-27 (×2): 50 ug via INTRAVENOUS

## 2020-01-27 MED ORDER — LACTATED RINGERS IV SOLN: INTRAVENOUS | Status: DC

## 2020-01-27 MED ORDER — SUCCINYLCHOLINE CHLORIDE 20 MG/ML IJ SOLN
INTRAMUSCULAR | Status: DC | PRN
  Administered 2020-01-27: 140 mg via INTRAVENOUS

## 2020-01-27 MED ORDER — PHENYLEPHRINE HCL (PRESSORS) 10 MG/ML IV SOLN
INTRAVENOUS | Status: DC | PRN
  Administered 2020-01-27: 100 ug via INTRAVENOUS

## 2020-01-27 MED ORDER — ORAL CARE MOUTH RINSE: 15.0000 mL | Freq: Once | OROMUCOSAL | Status: AC

## 2020-01-27 SURGICAL SUPPLY — 36 items
BLADE SURG 15 STRL LF DISP TIS (BLADE) ×1 IMPLANT
BLADE SURG 15 STRL SS (BLADE) ×2
CHLORAPREP W/TINT 26 (MISCELLANEOUS) ×3 IMPLANT
CLOTH BEACON ORANGE TIMEOUT ST (SAFETY) ×3 IMPLANT
COVER LIGHT HANDLE STERIS (MISCELLANEOUS) ×6 IMPLANT
COVER WAND RF STERILE (DRAPES) ×3 IMPLANT
DERMABOND ADVANCED (GAUZE/BANDAGES/DRESSINGS) ×2
DERMABOND ADVANCED .7 DNX12 (GAUZE/BANDAGES/DRESSINGS) ×1 IMPLANT
ELECT REM PT RETURN 9FT ADLT (ELECTROSURGICAL) ×3
ELECTRODE REM PT RTRN 9FT ADLT (ELECTROSURGICAL) ×1 IMPLANT
GLOVE BIO SURGEON STRL SZ 6.5 (GLOVE) ×2 IMPLANT
GLOVE BIO SURGEON STRL SZ7 (GLOVE) ×2 IMPLANT
GLOVE BIO SURGEONS STRL SZ 6.5 (GLOVE) ×1
GLOVE BIOGEL PI IND STRL 6.5 (GLOVE) ×1 IMPLANT
GLOVE BIOGEL PI IND STRL 7.0 (GLOVE) ×1 IMPLANT
GLOVE BIOGEL PI INDICATOR 6.5 (GLOVE) ×2
GLOVE BIOGEL PI INDICATOR 7.0 (GLOVE) ×4
GLOVE ECLIPSE 7.0 STRL STRAW (GLOVE) ×2 IMPLANT
GOWN STRL REUS W/TWL LRG LVL3 (GOWN DISPOSABLE) ×6 IMPLANT
INST SET MINOR GENERAL (KITS) ×3 IMPLANT
KIT TURNOVER KIT A (KITS) ×3 IMPLANT
MANIFOLD NEPTUNE II (INSTRUMENTS) ×3 IMPLANT
MESH VENTRALEX ST 1-7/10 CRC S (Mesh General) ×2 IMPLANT
NDL HYPO 18GX1.5 BLUNT FILL (NEEDLE) ×1 IMPLANT
NEEDLE HYPO 18GX1.5 BLUNT FILL (NEEDLE) ×3 IMPLANT
NEEDLE HYPO 22GX1.5 SAFETY (NEEDLE) ×3 IMPLANT
NS IRRIG 1000ML POUR BTL (IV SOLUTION) ×3 IMPLANT
PACK MINOR (CUSTOM PROCEDURE TRAY) ×3 IMPLANT
PAD ARMBOARD 7.5X6 YLW CONV (MISCELLANEOUS) ×3 IMPLANT
PENCIL SMOKE EVACUATOR (MISCELLANEOUS) ×2 IMPLANT
SET BASIN LINEN APH (SET/KITS/TRAYS/PACK) ×3 IMPLANT
SUT ETHIBOND NAB MO 7 #0 18IN (SUTURE) ×3 IMPLANT
SUT MNCRL AB 4-0 PS2 18 (SUTURE) ×3 IMPLANT
SUT VIC AB 3-0 SH 27 (SUTURE) ×2
SUT VIC AB 3-0 SH 27X BRD (SUTURE) ×1 IMPLANT
SYR 20ML LL LF (SYRINGE) ×6 IMPLANT

## 2020-01-27 NOTE — Interval H&P Note (Signed)
History and Physical Interval Note:  01/27/2020 10:12 AM  Christina [name]  has presented today for surgery, with the diagnosis of epigastric hernia.  The various methods of treatment have been discussed with the patient and family. After consideration of risks, benefits and other options for treatment, the patient has consented to  Procedure(s): HERNIA REPAIR EPIGASTRIC ADULT (N/A) as a surgical intervention.  The patient's history has been reviewed, patient examined, no change in status, stable for surgery.  I have reviewed the patient's chart and labs.  Questions were answered to the patient's satisfaction.    Patient has no questions. INR ordered pending ability to do the liver biopsy.  Lucretia Roers

## 2020-01-27 NOTE — Op Note (Signed)
[facility] Operative Note  01/27/20  Preoperative Diagnosis: Epigastric hernia    Postoperative Diagnosis: Same   Procedure(s) Performed:  Epigastric hernia repair with mesh Ventralex ST Patch 4.3cm   Surgeon: Leatrice Jewels. Henreitta Leber, MD   Assistants: No qualified resident was available    Anesthesia: General endotracheal   Anesthesiologist: Windell Norfolk, MD    Specimens:  None    Estimated Blood Loss: Minimal   Blood Replacement: None    Complications: None   Wound Class: Clean    Operative Indications: Ms. Markman is a 69 yo with a non reducible epigastric hernia with omentum. She has been having some pain, nausea/vomiting and weight loss, and although I do not think the majority of her symptoms are from this hernia, we discussed proceeding with repair given her tenderness and possibility it could help. We discussed the risk of bleeding, infection, use of mesh, injury to other organs, and potential to do a liver biopsy if the liver is close by per Dr. Karilyn Cota request. She opted to proceed.   Findings: Small epigastric hernia with sac with omentum, 1.5cm defect    Procedure: The patient was taken to the operating room and placed supine. General endotracheal anesthesia was induced. Intravenous antibiotics were administered per protocol.  The abdomen was prepared and draped in the usual sterile fashion.   The epigastric hernia was noted to be non reducible and measured about 1.5cm. An incision was made over the bulge, and carried down through the subcutaneous tissue with electrocautery.  Dissection was performed down to the level of the fascia, exposing the hernia sac.  The hernia sac was opened with care, and excess hernia sac was resected with electrocautery.  A finger was ran on the underlying peritoneum and this was clear.  A 4.3 cm Ventralex St Hernia Patch was placed and secured with 0 Ethibond sutures ensuring that it was against the peritoneal cavity.  The  hernia defect was then closed with 0 Ethibond suture in a vest over pants interrupted fashion over the patch.  The deep space was closed with interrupted a 3-0 Vicryl suture.   Hemostasis was confirmed. The skin was closed with a running 4-0 Monocryl suture and dermabond.    All counts were correct at the end of the case. The patient was awakened from anesthesia and extubated without complication.  The patient went to the PACU in stable condition.  Algis Greenhouse, MD [facility] [address] [place], [place] [address] [phone] (office)

## 2020-01-27 NOTE — Anesthesia Preprocedure Evaluation (Signed)
Anesthesia Evaluation  Patient identified by MRN, date of birth, ID band Patient awake    Reviewed: Allergy & Precautions, H&P , NPO status , Patient's Chart, lab work & pertinent test results, reviewed documented beta blocker date and time   Airway Mallampati: II  TM Distance: >3 FB Neck ROM: full    Dental no notable dental hx. (+) Missing   Pulmonary neg pulmonary ROS, former smoker,    Pulmonary exam normal breath sounds clear to auscultation       Cardiovascular Exercise Tolerance: Good hypertension, negative cardio ROS   Rhythm:regular Rate:Normal     Neuro/Psych negative neurological ROS  negative psych ROS   GI/Hepatic Neg liver ROS, GERD  Medicated,  Endo/Other  negative endocrine ROS  Renal/GU negative Renal ROS  negative genitourinary   Musculoskeletal   Abdominal   Peds  Hematology negative hematology ROS (+)   Anesthesia Other Findings   Reproductive/Obstetrics negative OB ROS                             Anesthesia Physical Anesthesia Plan  ASA: III  Anesthesia Plan: General   Post-op Pain Management:    Induction:   PONV Risk Score and Plan: 3  Airway Management Planned:   Additional Equipment:   Intra-op Plan:   Post-operative Plan:   Informed Consent: I have reviewed the patients History and Physical, chart, labs and discussed the procedure including the risks, benefits and alternatives for the proposed anesthesia with the patient or authorized representative who has indicated his/her understanding and acceptance.     Dental Advisory Given  Plan Discussed with: CRNA  Anesthesia Plan Comments:         Anesthesia Quick Evaluation

## 2020-01-27 NOTE — Anesthesia Procedure Notes (Signed)
Procedure Name: Intubation Performed by: Brynda Peon, CRNA Pre-anesthesia Checklist: Patient identified, Emergency Drugs available, Suction available, Patient being monitored and Timeout performed Patient Re-evaluated:Patient Re-evaluated prior to induction Oxygen Delivery Method: Circle system utilized Preoxygenation: Pre-oxygenation with 100% oxygen Induction Type: IV induction Laryngoscope Size: Miller and 2 Grade View: Grade II Tube type: Oral Tube size: 6.5 mm Airway Equipment and Method: Stylet Placement Confirmation: ETT inserted through vocal cords under direct vision,  positive ETCO2,  CO2 detector and breath sounds checked- equal and bilateral Secured at: 21 cm Tube secured with: Tape Dental Injury: Teeth and Oropharynx as per pre-operative assessment

## 2020-01-27 NOTE — Anesthesia Postprocedure Evaluation (Signed)
Anesthesia Post Note  Patient: Chief Executive Officer  Procedure(s) Performed: EPIGASTRIC HERNIA REPAIR WITH MESH (N/A Abdomen)  Patient location during evaluation: PACU Anesthesia Type: General Level of consciousness: awake, oriented, awake and alert and patient cooperative Pain management: pain level controlled Vital Signs Assessment: post-procedure vital signs reviewed and stable Respiratory status: spontaneous breathing, nonlabored ventilation and respiratory function stable Cardiovascular status: blood pressure returned to baseline and stable Postop Assessment: no headache and no backache Anesthetic complications: no   No complications documented.   Last Vitals:  Vitals:   01/27/20 0802 01/27/20 1142  BP: 115/68 124/74  Pulse: 88 88  Resp: 15 19  Temp: 36.9 C   SpO2: 100% 100%    Last Pain:  Vitals:   01/27/20 0802  TempSrc: Oral  PainSc:                  Brynda Peon

## 2020-01-27 NOTE — Progress Notes (Signed)
ANTICOAGULATION CONSULT NOTE - Follow Up Consult  Pharmacy Consult for  Restart Lovenox Indication: history of DVT / PE  Allergies  Allergen Reactions  . Ace Inhibitors Cough         Patient Measurements: Height: 5' 9.5" (176.5 cm) Weight: 56.7 kg (125 lb) IBW/kg (Calculated) : 67.35  Vital Signs: Temp: 98.8 F (37.1 C) (06/14 1543) Temp Source: Oral (06/14 1543) BP: 129/78 (06/14 1543) Pulse Rate: 85 (06/14 1543)  Labs: Recent Labs    01/25/20 0714 01/25/20 0714 01/26/20 0844 01/27/20 0531 01/27/20 1016  HGB 10.0*   < > 11.1* 10.1*  --   HCT 30.8*  --  35.0* 31.8*  --   PLT 263  --  341 298  --   LABPROT  --   --   --   --  15.5*  INR  --   --   --   --  1.3*  CREATININE 0.71  --  0.72 0.66  --    < > = values in this interval not displayed.    Estimated Creatinine Clearance: 58.6 mL/min (by C-G formula based on SCr of 0.66 mg/dL).   Assessment: 69 year old female on Eliquis prior to admission, last dose this evening, now on hold for planned hernia repair.  Surgery 6/14 for ventral hernia repair. To restart Lovenox post surgery on 6/15.  Goal of Therapy:  Monitor platelets by anticoagulation protocol: Yes   Plan:  Lovenox 1mg /kg (60mg ) sq Q 12 hours to begin in AM Follow CBC F/U restart apixaban  , BS , BCPS Clinical Pharmacist Pager [phone] 01/27/2020,3:52 PM

## 2020-01-27 NOTE — Progress Notes (Signed)
[facility]  Notified husband surgery complete. Epigastric hernia repair with mesh. Unable to do liver biopsy due to location and size of defect.   Diet now.  Abdominal binder. Can start back lovenox tomorrow 6/15.  Algis Greenhouse, MD [facility] [address]ella Raring [place], [place] [address] [phone] (office)

## 2020-01-27 NOTE — Progress Notes (Signed)
Subjective:  Patient reports feeling better.  She says her appetite has been good.  She has not experienced nausea vomiting melena or rectal bleeding.  She is passing flatus.  She complains of pain across upper and lower abdomen.  She describes pain to be mild.  Current Medications:  Current Facility-Administered Medications:  .  acetaminophen (TYLENOL) tablet 650 mg, 650 mg, Oral, Q6H PRN **OR** acetaminophen (TYLENOL) suppository 650 mg, 650 mg, Rectal, Q6H PRN, Jamse Arn, Srobona Tublu, MD .  ceFAZolin (ANCEF) IVPB 2g/100 mL premix, 2 g, Intravenous, On Call to OR, Virl Cagey, MD .  Chlorhexidine Gluconate Cloth 2 % PADS 6 each, 6 each, Topical, Q0600, Murlean Iba, MD, 6 each at 01/27/20 [phone] .  diclofenac sodium (VOLTAREN) 1 % transdermal gel 2 g, 2 g, Topical, TID PRN, Jamse Arn, Dewaine Oats Tublu, MD .  feeding supplement (ENSURE ENLIVE) (ENSURE ENLIVE) liquid 237 mL, 237 mL, Oral, TID BM, Johnson, Clanford L, MD .  loperamide (IMODIUM) capsule 2 mg, 2 mg, Oral, QID PRN, Vashti Hey, MD .  multivitamin with minerals tablet 1 tablet, 1 tablet, Oral, Daily, Wynetta Emery, Clanford L, MD, 1 tablet at 01/26/20 0925 .  mupirocin ointment (BACTROBAN) 2 % 1 application, 1 application, Nasal, BID, Johnson, Clanford L, MD, 1 application at 00/76/22 2300 .  ondansetron (ZOFRAN) tablet 4 mg, 4 mg, Oral, Q6H PRN **OR** ondansetron (ZOFRAN) injection 4 mg, 4 mg, Intravenous, Q6H PRN, Jamse Arn, Dewaine Oats Tublu, MD .  pantoprazole (PROTONIX) EC tablet 40 mg, 40 mg, Oral, Daily, Bonnell Public Tublu, MD, 40 mg at 01/26/20 0926   Objective: Blood pressure 115/68, pulse 88, temperature 98.5 F (36.9 C), temperature source Oral, resp. rate 15, height 5' 9.5" (1.765 m), weight 61.5 kg, SpO2 100 %. Patient is alert and in no acute distress. Lump in epigastric region more on the right than the left side which is soft mildly tender and incompletely reducible.  She also has mild  tenderness across lower abdomen.  No organomegaly or masses.   Labs/studies Results:  CBC Latest Ref Rng & Units 01/27/2020 01/26/2020 01/25/2020  WBC 4.0 - 10.5 K/uL 5.2 6.5 5.9  Hemoglobin 12.0 - 15.0 g/dL 10.1(L) 11.1(L) 10.0(L)  Hematocrit 36 - 46 % 31.8(L) 35.0(L) 30.8(L)  Platelets 150 - 400 K/uL 298 341 263    CMP Latest Ref Rng & Units 01/27/2020 01/26/2020 01/25/2020  Glucose 70 - 99 mg/dL 90 82 74  BUN 8 - 23 mg/dL 5(L) 7(L) 8  Creatinine 0.44 - 1.00 mg/dL 0.66 0.72 0.71  Sodium 135 - 145 mmol/L 139 136 141  Potassium 3.5 - 5.1 mmol/L 3.8 3.9 3.8  Chloride 98 - 111 mmol/L 104 103 106  CO2 22 - 32 mmol/L _0 Calcium 8.9 - 10.3 mg/dL 8.8(L) 8.8(L) 9.0  Total Protein 6.5 - 8.1 g/dL - - -  Total Bilirubin 0.3 - 1.2 mg/dL - - -  Alkaline Phos 38 - 126 U/L - - -  AST 15 - 41 U/L - - -  ALT 0 - 44 U/L - - -    Hepatic Function Latest Ref Rng & Units 01/24/2020 01/23/2020 12/16/2019  Total Protein 6.5 - 8.1 g/dL 4.7(L) 5.1(L) 5.4(L)  Albumin 3.5 - 5.0 g/dL 2.4(L) 2.9(L) -  AST 15 - 41 U/L 19 23 47(H)  ALT 0 - 44 U/L _1 Alk Phosphatase 38 - 126 U/L 52 61 -  Total Bilirubin 0.3 - 1.2 mg/dL 1.3(H) 1.6(H) 0.5  Bilirubin, Direct 0.0 -  0.2 mg/dL - - 0.1    Serum iron 37 TIBC 133 saturation 28% Ferritin 201 B12 562  Folate 5.8   Assessment:  #1.  Ventral hernia.  Patient is scheduled for surgery this morning.  She has her nails sac containing omentum with inflammatory changes suggesting chronic incarceration.  I believe this would explain patient's symptoms as no other abnormality noted on CT to account for abdominal pain nausea vomiting and weight loss.  #2.  Anemia.  Iron studies are consistent with chronic disease anemia but she also has folate deficiency.  Multivitamin with mineral only has 643 mcg of folic acid.  She would therefore be changed to prenatal vitamin which has 1 mg of folic acid.  #3.  Clotting disorder.  Unable to find out which specific disorder she  has.  She was on Eliquis/apixaban on admission and now being bridged with Lovenox/enoxaparin.  #4.  History of elevated transaminases felt to be due to fatty liver.  Transaminases are normal.  Serum albumin is low most likely due to chronic GI symptoms and not indicated of cirrhosis.  She does not have thrombocytopenia splenomegaly or liver contour changes suggest cirrhosis.   Plan:  Change multivitamin with mineral to prenatal multivitamin so she gets 1 mg of folic acid daily. Need for further work-up will depend on her clinical course following hernia repair.

## 2020-01-27 NOTE — Transfer of Care (Signed)
Immediate Anesthesia Transfer of Care Note  Patient: Christina [name]  Procedure(s) Performed: EPIGASTRIC HERNIA REPAIR WITH MESH (N/A Abdomen)  Patient Location: PACU  Anesthesia Type:General  Level of Consciousness: awake, alert , oriented and patient cooperative  Airway & Oxygen Therapy: Patient Spontanous Breathing and Patient connected to face mask oxygen  Post-op Assessment: Report given to RN, Post -op Vital signs reviewed and stable and Patient moving all extremities  Post vital signs: Reviewed and stable  Last Vitals:  Vitals Value Taken Time  BP 150/85 01/27/20 1504  Temp    Pulse 91 01/27/20 1506  Resp 19 01/27/20 1506  SpO2 100 % 01/27/20 1506  Vitals shown include unvalidated device data.  Last Pain:  Vitals:   01/27/20 0802  TempSrc: Oral  PainSc:       Patients Stated Pain Goal: 7 (01/27/20 1142)  Complications: No complications documented.

## 2020-01-27 NOTE — Progress Notes (Addendum)
PROGRESS NOTE   Christina [name]  XHB:[id number] DOB: 07/17/1951 DOA: 01/23/2020 PCP: Bennie Pierini, FNP   Chief Complaint  Patient presents with   Emesis   Brief Admission History:  69 year old female with history of DVT, hypertension, hyperlipidemia has apparently been suffering with abnormal weight loss of about 40 pounds of unintentional weight loss over the last several months and chronic diarrhea.  She was supposed to have an outpatient EGD and colonoscopy but has not been completed as of this time.  She has been taking Cipro and Flagyl and reports that she completed that after being diagnosed with an ileitis.  Her main complaint was diarrheal illness and generalized weakness.  Assessment & Plan:   Principal Problem:   Weight loss, unintentional Active Problems:   Essential hypertension, benign   Unexplained weight loss  Leukocytosis-resolved with supportive therapy. Diarrheal illness-no BM since admission.  Continue supportive care. Unintentional weight loss/abnormal weight loss-Further work up pending clinical course after hernia repair.  GI following see consult notes. Severe protein calorie malnutrition - dietitian consulted.  Epigastric hernia - pod #0 s/p hernia repair see op notes.  Post op mgmt per Dr. Henreitta Leber.    Mild dehydration-treating with gentle IV fluids. Hypoalbuminemia- dietitian consultation appreciated.  She is eating better now. Hyperbilirubinemia-trending down with IV fluids. Anemia of chronic disease-slightly decreased folic acid, otherwise fairly normal indices.  Coagulopathy-per Dr Henreitta Leber can resume anticoagulation tomorrow 6/15.   DVT prophylaxis: heparin IV  Code Status: Full Family Communication: bedside update Disposition:   Status is: Inpatient   Dispo: The patient is from: The patient remains INP appropriate.              Anticipated d/c is to: Home              Anticipated d/c date is: 2 days pending surgery sign off.  Pt going to OR  6/14 for hernia repair              Patient currently is not medically stable to d/c.  Consultants:  GI surgery  Procedures:    Antimicrobials:     Subjective: Patient was seen prior to surgery. She reports feeling well. No complaints.    Objective: Vitals:   01/27/20 1530 01/27/20 1543  BP: (!) 134/93 129/78  Pulse: 87 85  Resp: 15   Temp: 98.3 F (36.8 C) 98.8 F (37.1 C)  SpO2: 95% 100%    Examination:  General exam: Appears calm and comfortable  Respiratory system: Clear to auscultation. Respiratory effort normal. Cardiovascular system: S1 & S2 heard, RRR. No JVD, murmurs, rubs, gallops or clicks. No pedal edema. Gastrointestinal system: Abdomen is nondistended, soft and nontender. No organomegaly or masses felt. Normal bowel sounds heard. Central nervous system: Alert and oriented. No focal neurological deficits. Extremities: Symmetric 5 x 5 power. Skin: No rashes, lesions or ulcers Psychiatry: Judgement and insight appear normal. Mood & affect appropriate.   Data Reviewed: I have personally reviewed following labs and imaging studies  CBC: Recent Labs  Lab 01/23/20 1614 01/24/20 0613  WBC 12.2* 6.8  NEUTROABS 10.7*  --   HGB 10.9* 9.2*  HCT 33.1* 29.5*  MCV 81.1 82.2  PLT 250 225    Basic Metabolic Panel: Recent Labs  Lab 01/23/20 1614 01/24/20 0613  NA 138 138  K 4.3 3.6  CL 104 107  CO2 22 25  GLUCOSE 109* 91  BUN 8 9  CREATININE 0.77 0.76  CALCIUM 9.2 8.6*    GFR: Estimated Creatinine  Clearance: 69.6 mL/min (by C-G formula based on SCr of 0.76 mg/dL).  Liver Function Tests: Recent Labs  Lab 01/23/20 1614 01/24/20 0613  AST 23 19  ALT 13 12  ALKPHOS 61 52  BILITOT 1.6* 1.3*  PROT 5.1* 4.7*  ALBUMIN 2.9* 2.4*    CBG: No results for input(s): GLUCAP in the last 168 hours.  Recent Results (from the past 240 hour(s))  Blood culture (routine x 2)     Status: None (Preliminary result)   Collection Time: 01/23/20  4:36 PM    Specimen: Left Antecubital; Blood  Result Value Ref Range Status   Specimen Description LEFT ANTECUBITAL  Final   Special Requests   Final    BOTTLES DRAWN AEROBIC AND ANAEROBIC Blood Culture adequate volume   Culture   Final    NO GROWTH < 24 HOURS Performed at [facility], [address], [place], [place] [address]    Report Status PENDING  Incomplete  Blood culture (routine x 2)     Status: None (Preliminary result)   Collection Time: 01/23/20  4:36 PM   Specimen: Left Antecubital; Blood  Result Value Ref Range Status   Specimen Description LEFT ANTECUBITAL  Final   Special Requests   Final    BOTTLES DRAWN AEROBIC AND ANAEROBIC Blood Culture adequate volume   Culture   Final    NO GROWTH < 24 HOURS Performed at [facility], [address], [place], [place] [address]    Report Status PENDING  Incomplete  SARS Coronavirus 2 by RT PCR (hospital order, performed in [facility] hospital lab) Nasopharyngeal Nasopharyngeal Swab     Status: None   Collection Time: 01/23/20  5:32 PM   Specimen: Nasopharyngeal Swab  Result Value Ref Range Status   SARS Coronavirus 2 NEGATIVE NEGATIVE Final    Comment: (NOTE) SARS-CoV-2 target nucleic acids are NOT DETECTED.  The SARS-CoV-2 RNA is generally detectable in upper and lower respiratory specimens during the acute phase of infection. The lowest concentration of SARS-CoV-2 viral copies this assay can detect is 250 copies / mL. A negative result does not preclude SARS-CoV-2 infection and should not be used as the sole basis for treatment or other patient management decisions.  A negative result may occur with improper specimen collection / handling, submission of specimen other than nasopharyngeal swab, presence of viral mutation(s) within the areas targeted by this assay, and inadequate number of viral copies (<250 copies / mL). A negative result must be combined with clinical observations, patient history, and epidemiological  information.  Fact Sheet for Patients:   BoilerBrush.com.cy  Fact Sheet for Healthcare Providers: https://pope.com/  This test is not yet approved or  cleared by the [place] FDA and has been authorized for detection and/or diagnosis of SARS-CoV-2 by FDA under an Emergency Use Authorization (EUA).  This EUA will remain in effect (meaning this test can be used) for the duration of the COVID-19 declaration under Section 564(b)(1) of the Act, 21 U.S.C. section 360bbb-3(b)(1), unless the authorization is terminated or revoked sooner.  Performed at [facility], [address], [place], [place] [address]     Radiology Studies: No results found.   Scheduled Meds:  apixaban  5 mg Oral BID   pantoprazole  40 mg Oral Daily   Continuous Infusions:  lactated ringers 125 mL/hr at 01/24/20 0536    LOS: 0 days   Time spent: 15 mins    Deasiah Hagberg Laural Benes, MD How to contact the [facility] Attending or Consulting  provider [place] or covering provider during after hours [place], for this patient?  Check the care team in [facility] and look for a) attending/consulting [facility] provider listed and b) the [facility] team listed Log into [email or web address] and use [place]'s universal password to access. If you do not have the password, please contact the hospital operator. Locate the [facility] Main provider you are looking for under [facility] and page to a number that you can be directly reached. If you still have difficulty reaching the provider, please page the [facility] (Director on Call) for the Hospitalists listed on amion for assistance.  01/24/2020, 2:35 PM

## 2020-01-28 ENCOUNTER — Encounter (HOSPITAL_COMMUNITY): Payer: Self-pay | Admitting: General Surgery

## 2020-01-28 ENCOUNTER — Other Ambulatory Visit (INDEPENDENT_AMBULATORY_CARE_PROVIDER_SITE_OTHER): Payer: Self-pay | Admitting: *Deleted

## 2020-01-28 DIAGNOSIS — R770 Abnormality of albumin: Secondary | ICD-10-CM

## 2020-01-28 DIAGNOSIS — D649 Anemia, unspecified: Secondary | ICD-10-CM

## 2020-01-28 LAB — BASIC METABOLIC PANEL
Anion gap: 8 (ref 5–15)
BUN: 11 mg/dL (ref 8–23)
CO2: 26 mmol/L (ref 22–32)
Calcium: 9.2 mg/dL (ref 8.9–10.3)
Chloride: 106 mmol/L (ref 98–111)
Creatinine, Ser: 0.65 mg/dL (ref 0.44–1.00)
GFR calc Af Amer: >60 mL/min (ref >60)
GFR calc non Af Amer: >60 mL/min (ref >60)
Glucose, Bld: 120 mg/dL — ABNORMAL HIGH (ref 70–99)
Potassium: 4.2 mmol/L (ref 3.5–5.1)
Sodium: 140 mmol/L (ref 135–145)

## 2020-01-28 LAB — CULTURE, BLOOD (ROUTINE X 2)
Culture: NO GROWTH
Culture: NO GROWTH
Special Requests: ADEQUATE
Special Requests: ADEQUATE

## 2020-01-28 LAB — CBC
HCT: 32.2 % — ABNORMAL LOW (ref 36.0–46.0)
Hemoglobin: 10.2 g/dL — ABNORMAL LOW (ref 12.0–15.0)
MCH: 26.2 pg (ref 26.0–34.0)
MCHC: 31.7 g/dL (ref 30.0–36.0)
MCV: 82.8 fL (ref 80.0–100.0)
Platelets: 319 10*3/uL (ref 150–400)
RBC: 3.89 MIL/uL (ref 3.87–5.11)
RDW: 16.5 % — ABNORMAL HIGH (ref 11.5–15.5)
WBC: 8.6 10*3/uL (ref 4.0–10.5)
nRBC: 0 % (ref 0.0–0.2)

## 2020-01-28 MED ORDER — ACETAMINOPHEN 325 MG PO TABS: 650.0000 mg | ORAL_TABLET | Freq: Four times a day (QID) | ORAL | Status: AC | PRN

## 2020-01-28 MED ORDER — DICLOFENAC SODIUM 1 % TD GEL: 2.0000 g | Freq: Three times a day (TID) | TRANSDERMAL | Status: DC | PRN

## 2020-01-28 MED ORDER — APIXABAN 5 MG PO TABS: 5.0000 mg | ORAL_TABLET | Freq: Two times a day (BID) | ORAL | Status: DC

## 2020-01-28 MED ORDER — OXYCODONE HCL 5 MG PO TABS: 5.0000 mg | ORAL_TABLET | Freq: Four times a day (QID) | ORAL | 0 refills | Status: AC | PRN

## 2020-01-28 MED ORDER — DOCUSATE SODIUM 100 MG PO CAPS
100.0000 mg | ORAL_CAPSULE | Freq: Two times a day (BID) | ORAL | 0 refills | Status: DC | PRN
Start: 2020-01-28 — End: 2020-02-20

## 2020-01-28 MED ORDER — PRENATAL VITAMINS 28-0.8 MG PO TABS: 1.0000 | ORAL_TABLET | Freq: Every day | ORAL | 3 refills | Status: DC

## 2020-01-28 NOTE — Progress Notes (Signed)
ANTICOAGULATION CONSULT NOTE - Follow Up Consult  Pharmacy Consult for  Restart eliquis Indication: history of DVT / PE  Allergies  Allergen Reactions  . Ace Inhibitors Cough         Patient Measurements: Height: 5' 9.5" (176.5 cm) Weight: 56.7 kg (125 lb) IBW/kg (Calculated) : 67.35  Vital Signs: Temp: 98 F (36.7 C) (06/15 0458) Temp Source: Oral (06/15 0458) BP: 122/80 (06/15 0458) Pulse Rate: 79 (06/15 0458)  Labs: Recent Labs    01/26/20 0844 01/26/20 0844 01/27/20 0531 01/27/20 1016 01/28/20 0626  HGB 11.1*   < > 10.1*  --  10.2*  HCT 35.0*  --  31.8*  --  32.2*  PLT 341  --  298  --  319  LABPROT  --   --   --  15.5*  --   INR  --   --   --  1.3*  --   CREATININE 0.72  --  0.66  --  0.65   < > = values in this interval not displayed.    Estimated Creatinine Clearance: 58.6 mL/min (by C-G formula based on SCr of 0.65 mg/dL).   Assessment: 69 year old female on Eliquis prior to admission, last dose this evening, now on hold for planned hernia repair.  Surgery 6/14 for ventral hernia repair. To restart Lovenox post surgery on 6/15. 6/15 Transition to po eliquis today  Goal of Therapy:  Monitor platelets by anticoagulation protocol: Yes   Plan:  D/c lovenox Eliquis 5mg  po bid to start this evening Follow CBC/Monitor for s/s of bleeding  , BS Elder Cyphers, BCPS Clinical Pharmacist Pager [phone] 01/28/2020,12:42 PM

## 2020-01-28 NOTE — Discharge Summary (Signed)
Physician Discharge Summary  Christina [name] EAV:[id number] DOB: 10-03-1950 DOA: 01/23/2020  PCP: Bennie PieriniMartin, Mary-Margaret, FNP GI: Rehman Surgery: Henreitta LeberBridges   Admit date: 01/23/2020 Discharge date: 01/28/2020  Admitted From:  HOME  Disposition:  HOME   Recommendations for Outpatient Follow-up:  1. Follow up with PCP in 1 weeks 2. Follow up with Dr. Henreitta LeberBridges on 7/8 as scheduled.  3. Follow up with Dr. Karilyn Cotaehman in 2-4 weeks.  4. Please obtain CBC in 1-2 weeks to follow hemoglobin.   Discharge Condition: STABLE   CODE STATUS: FULL    Brief Hospitalization Summary: Please see all hospital notes, images, labs for full details of the hospitalization. ADMISSION HPI:  Christina [name] is an 69 y.o. female with PMH significant for HTN and hypercoagulable state on Eliquis who was admitted primarily because family refused to take her home because they are so frustrated with patient's ongoing weight loss nausea vomiting and diarrhea which has been worked up at [facility] and is awaiting outpatient colonoscopy and EGD.  Patient states that her weight loss issues date back to 2019 when she initially tried to lose weight because she weighed about 235 pounds as far she remembers.  However over the past year and increasingly over the past 6 months patient has had significant increase in her weight loss even though she is no longer trying to lose weight.  She believes she now weighs 150 pounds.  Patient also notes she has chronic diarrhea nausea vomiting anorexia and initially had loss of taste and smell.  She notes all of the symptoms have worsened significantly since she had her knee replaced in January.  Patient was seen at [facility] and was diagnosed with gastroenteritis and treated conservatively with some improvement of symptoms.  CT scan of the abdomen pelvis was unrevealing.  [facility] last week with recurrent diarrhea.  C. difficile was ruled out at the time.  She was treated with  Cipro/Flagyl with improvement in symptoms and she was discharged on 530.  Patient is now brought back into ED by family who is very frustrated that patient continues to have persistent diarrhea, nausea and vomiting and persistent weight loss.  They would like to have something done and do not want a wait for outpatient colonoscopy and EGD.  ED Course:  The patient was noted to be slightly hypotensive with some tachycardia.  Laboratory data are notable primarily for a lipase of 120.  As noted previously CT scan of abdomen pelvis done last [facility] at [facility] is unrevealing.  Brief Admission History:  69 year old female with history of DVT, hypertension, hyperlipidemia has apparently been suffering with abnormal weight loss of about 40 pounds of unintentional weight loss over the last several months and chronic diarrhea.  She was supposed to have an outpatient EGD and colonoscopy but has not been completed as of this time.  She has been taking Cipro and Flagyl and reports that she completed that after being diagnosed with an ileitis.  Her main complaint was diarrheal illness and generalized weakness.  Assessment & Plan:   Principal Problem:   Weight loss, unintentional Active Problems:   Essential hypertension, benign   Unexplained weight loss   Leukocytosis-RESOLVED with supportive therapy.  Diarrheal illness-RESOLVED with supportive care.  Unintentional weight loss/abnormal weight loss-Further work up pending clinical course after hernia repair.  GI follow up recommended.  Pt is eating and drinking well and reports improved appetite.   Severe protein calorie malnutrition - dietitian consulted.   Epigastric  hernia - pod #1 s/p hernia repair see op notes.  Post op mgmt per Dr. Constance Haw.  No heavy lifting>10 lbs, excessive bending, pushing, pulling, or squatting for 6-8 weeks.    Mild dehydration-TREATED with gentle IV fluids.  Hypoalbuminemia- dietitian consultation appreciated.  She  is eating much better now.  Mild Hyperbilirubinemia-trended down with IV fluids.  Anemia of chronic disease-Started on prenatal vitamins per Dr. Laural Golden.   Coagulopathy-resume home apixaban.    Discharge Diagnoses:  Principal Problem:   Weight loss, unintentional Active Problems:   Essential hypertension, benign   Unexplained weight loss   Incarcerated epigastric hernia   Nausea vomiting and diarrhea   Abnormal weight loss   Discharge Instructions:  Allergies as of 01/28/2020      Reactions   Ace Inhibitors Cough         Medication List    STOP taking these medications   amLODipine 5 MG tablet Commonly known as: NORVASC   HYDROcodone-acetaminophen 5-325 MG tablet Commonly known as: NORCO/VICODIN   metoprolol succinate 25 MG 24 hr tablet Commonly known as: TOPROL-XL   metroNIDAZOLE 500 MG tablet Commonly known as: FLAGYL   olmesartan 40 MG tablet Commonly known as: BENICAR     TAKE these medications   acetaminophen 325 MG tablet Commonly known as: TYLENOL Take 2 tablets (650 mg total) by mouth every 6 (six) hours as needed for mild pain (or Fever >/= 101).   apixaban 5 MG Tabs tablet Commonly known as: ELIQUIS Take 1 tablet by mouth 2 (two) times daily.   diclofenac sodium 1 % Gel Commonly known as: VOLTAREN Apply 2 g topically 3 (three) times daily as needed (knee pain).   docusate sodium 100 MG capsule Commonly known as: Colace Take 1 capsule (100 mg total) by mouth 2 (two) times daily as needed for mild constipation.   loperamide 2 MG capsule Commonly known as: IMODIUM Take 1 capsule by mouth 4 (four) times daily as needed.   ondansetron 4 MG disintegrating tablet Commonly known as: ZOFRAN-ODT Take 1 tablet by mouth every 8 (eight) hours.   oxyCODONE 5 MG immediate release tablet Commonly known as: Oxy IR/ROXICODONE Take 1 tablet (5 mg total) by mouth every 6 (six) hours as needed for up to 3 days for severe pain.   pantoprazole 40 MG  tablet Commonly known as: PROTONIX TAKE 1 TABLET BY MOUTH EVERY DAY   Prenatal Vitamins 28-0.8 MG Tabs Take 1 tablet by mouth daily.       Follow-up Information    Virl Cagey, MD Follow up on 02/20/2020.   Specialty: General Surgery Contact information: [address] Linna Hoff [facility] [address] [place], [place], FNP. Schedule an appointment as soon as possible for a visit in 1 week(s).   Specialty: Family Medicine Contact information: [place] [address] [phone]        Rogene Houston, MD. Schedule an appointment as soon as possible for a visit in 1 month(s).   Specialty: Gastroenterology Contact information: [address], [address] [place] [address] [phone]              Allergies  Allergen Reactions  . Ace Inhibitors Cough        Allergies as of 01/28/2020      Reactions   Ace Inhibitors Cough         Medication List    STOP taking these medications   amLODipine 5 MG  tablet Commonly known as: NORVASC   HYDROcodone-acetaminophen 5-325 MG tablet Commonly known as: NORCO/VICODIN   metoprolol succinate 25 MG 24 hr tablet Commonly known as: TOPROL-XL   metroNIDAZOLE 500 MG tablet Commonly known as: FLAGYL   olmesartan 40 MG tablet Commonly known as: BENICAR     TAKE these medications   acetaminophen 325 MG tablet Commonly known as: TYLENOL Take 2 tablets (650 mg total) by mouth every 6 (six) hours as needed for mild pain (or Fever >/= 101).   apixaban 5 MG Tabs tablet Commonly known as: ELIQUIS Take 1 tablet by mouth 2 (two) times daily.   diclofenac sodium 1 % Gel Commonly known as: VOLTAREN Apply 2 g topically 3 (three) times daily as needed (knee pain).   docusate sodium 100 MG capsule Commonly known as: Colace Take 1 capsule (100 mg total) by mouth 2 (two) times daily as needed for mild constipation.   loperamide 2 MG capsule Commonly known as: IMODIUM Take 1  capsule by mouth 4 (four) times daily as needed.   ondansetron 4 MG disintegrating tablet Commonly known as: ZOFRAN-ODT Take 1 tablet by mouth every 8 (eight) hours.   oxyCODONE 5 MG immediate release tablet Commonly known as: Oxy IR/ROXICODONE Take 1 tablet (5 mg total) by mouth every 6 (six) hours as needed for up to 3 days for severe pain.   pantoprazole 40 MG tablet Commonly known as: PROTONIX TAKE 1 TABLET BY MOUTH EVERY DAY   Prenatal Vitamins 28-0.8 MG Tabs Take 1 tablet by mouth daily.       Procedures/Studies: CT Abdomen Pelvis W Contrast  Result Date: 01/08/2020 CLINICAL DATA:  Pt c/o periumbilical pain with n,v which started After knee surgery Jan 2020. Also says she's had about a 50lb weight loss.^111mL Vomiting, nonbilious, EXAM: CT ABDOMEN AND PELVIS WITH CONTRAST TECHNIQUE: Multidetector CT imaging of the abdomen and pelvis was performed using the standard protocol following bolus administration of intravenous contrast. CONTRAST:  OMNIPAQUE IOHEXOL 300 MG/ML  SOLN COMPARISON:  Abdominal MRI 10/22/2016, 05/11/2018 FINDINGS: Lower chest: Lung bases are clear. Hepatobiliary: No focal hepatic lesion. No biliary duct dilatation. Gallbladder is normal. Common bile duct is normal. Pancreas: Pancreas is normal. No ductal dilatation. No pancreatic inflammation. Spleen: Normal spleen Adrenals/urinary tract: Adrenal glands normal. Irregular cystic lesion extending from the upper pole of the RIGHT kidney has intermediate density fluid with density mildly greater than water. Lesion measures 5.1 x 2.0 cm (image 18/series 2). Lesion identified on comparison MRI measuring similar size. On delayed imaging (series 3), contrast collects in dependent aspect of this cystic lesion suggesting a communication with the collecting system (image 6/3). Potential large caliceal diverticulum. Bilateral renal cortical thinning.  Ureters and bladder normal Stomach/Bowel: Gastric wall thickening through  the GE junction appears benign. Duodenum, small-bowel appendix and cecum normal. Colon and rectosigmoid colon normal. Vascular/Lymphatic: Abdominal aorta is normal caliber with atherosclerotic calcification. There is no retroperitoneal or periportal lymphadenopathy. No pelvic lymphadenopathy. Reproductive: Uterus and adnexa unremarkable. Other: Large midline ventral hernia superior to the umbilicus. Hernia mouth measures 1.3 cm (sagittal image 58). The hernia sac measures 4.8 cm on same image. There is fluid and mild inflammation within the hernia sac. No bowel enters hernia. Very small umbilical hernia. Musculoskeletal: No aggressive osseous lesion. IMPRESSION: 1. A fairly large and mildly inflamed ventral hernia superior to the umbilicus. No bowel associated with the hernia sac. 2. No acute findings in the abdomen pelvis otherwise. 3. Irregular cystic lesion in the upper pole  of the RIGHT kidney is a stable compared to MRI 2018. Potential large caliceal diverticulum. Electronically Signed   By: Genevive Bi M.D.   On: 01/08/2020 13:46      Subjective: Patient says she feels great today.  She is tolerating her diet.  Her appetite is improving.  She has worked with physical therapy and they have no further recommendations for home care.  She is agreeable to follow-up with Dr. Henreitta Leber on 7/8 as scheduled.  Patient also will follow up with Dr. Karilyn Cota and PCP.   Discharge Exam: Vitals:   01/27/20 2221 01/28/20 0458  BP: 115/72 122/80  Pulse: 87 79  Resp: 20 20  Temp: 98.4 F (36.9 C) 98 F (36.7 C)  SpO2: 98% 100%   Vitals:   01/27/20 1530 01/27/20 1543 01/27/20 2221 01/28/20 0458  BP: (!) 134/93 129/78 115/72 122/80  Pulse: 87 85 87 79  Resp: 15  20 20   Temp: 98.3 F (36.8 C) 98.8 F (37.1 C) 98.4 F (36.9 C) 98 F (36.7 C)  TempSrc:  Oral Oral Oral  SpO2: 95% 100% 98% 100%  Weight:      Height:       General: Pt is alert, awake, not in acute distress Cardiovascular: RRR, S1/S2 +,  no rubs, no gallops Respiratory: CTA bilaterally, no wheezing, no rhonchi Abdominal: Soft, NT, ND, bowel sounds + Extremities: no edema, no cyanosis   The results of significant diagnostics from this hospitalization (including imaging, microbiology, ancillary and laboratory) are listed below for reference.     Microbiology: Recent Results (from the past 240 hour(s))  Blood culture (routine x 2)     Status: None   Collection Time: 01/23/20  4:36 PM   Specimen: Left Antecubital; Blood  Result Value Ref Range Status   Specimen Description LEFT ANTECUBITAL  Final   Special Requests   Final    BOTTLES DRAWN AEROBIC AND ANAEROBIC Blood Culture adequate volume   Culture   Final    NO GROWTH 5 DAYS Performed at [facility], [address] [place], [place]    Report Status 01/28/2020 FINAL  Final  Blood culture (routine x 2)     Status: None   Collection Time: 01/23/20  4:36 PM   Specimen: Left Antecubital; Blood  Result Value Ref Range Status   Specimen Description LEFT ANTECUBITAL  Final   Special Requests   Final    BOTTLES DRAWN AEROBIC AND ANAEROBIC Blood Culture adequate volume   Culture   Final    NO GROWTH 5 DAYS Performed at [facility], [address], [place], [place]    Report Status 01/28/2020 FINAL  Final  SARS Coronavirus 2 by RT PCR (hospital order, performed in [facility] hospital lab) Nasopharyngeal Nasopharyngeal Swab     Status: None   Collection Time: 01/23/20  5:32 PM   Specimen: Nasopharyngeal Swab  Result Value Ref Range Status   SARS Coronavirus 2 NEGATIVE NEGATIVE Final    Comment: (NOTE) SARS-CoV-2 target nucleic acids are NOT DETECTED.  The SARS-CoV-2 RNA is generally detectable in upper and lower respiratory specimens during the acute phase of infection. The lowest concentration of SARS-CoV-2 viral copies this assay can detect is 250 copies / mL. A negative result does not preclude SARS-CoV-2 infection and should not be used as  the sole basis for treatment or other patient management decisions.  A negative result may occur with improper specimen collection / handling, submission of specimen other than nasopharyngeal  swab, presence of viral mutation(s) within the areas targeted by this assay, and inadequate number of viral copies (<250 copies / mL). A negative result must be combined with clinical observations, patient history, and epidemiological information.  Fact Sheet for Patients:   BoilerBrush.com.cy  Fact Sheet for Healthcare Providers: https://pope.com/  This test is not yet approved or  cleared by the [place] FDA and has been authorized for detection and/or diagnosis of SARS-CoV-2 by FDA under an Emergency Use Authorization (EUA).  This EUA will remain in effect (meaning this test can be used) for the duration of the COVID-19 declaration under Section 564(b)(1) of the Act, 21 U.S.C. section 360bbb-3(b)(1), unless the authorization is terminated or revoked sooner.  Performed at [facility], [address], [place], [place] [address]   Surgical pcr screen     Status: Abnormal   Collection Time: 01/26/20  2:59 PM   Specimen: Nasal Mucosa; Nasal Swab  Result Value Ref Range Status   MRSA, PCR NEGATIVE NEGATIVE Final   Staphylococcus aureus POSITIVE (A) NEGATIVE Final    Comment: RESULT CALLED TO, READ BACK BY AND VERIFIED WITH: HOWERTON M @ 1712 ON [id number] BY HENDERSON L. (NOTE) The Xpert SA Assay (FDA approved for NASAL specimens in patients 39 years of age and older), is one component of a comprehensive surveillance program. It is not intended to diagnose infection nor to guide or monitor treatment. Performed at [facility], [address] [place], [place] [address]      Labs: BNP (last 3 results) No results for input(s): BNP in the last 8760 hours. Basic Metabolic Panel: Recent Labs  Lab 01/24/20 0613 01/25/20 0714 01/26/20 0844  01/27/20 0531 01/28/20 0626  NA 138 141 136 139 140  K 3.6 3.8 3.9 3.8 4.2  CL 107 106 103 104 106  CO2 25 25 25 27 26   GLUCOSE 91 74 82 90 120*  BUN 9 8 7* 5* 11  CREATININE 0.76 0.71 0.72 0.66 0.65  CALCIUM 8.6* 9.0 8.8* 8.8* 9.2   Liver Function Tests: Recent Labs  Lab 01/23/20 1614 01/24/20 0613  AST 23 19  ALT 13 12  ALKPHOS 61 52  BILITOT 1.6* 1.3*  PROT 5.1* 4.7*  ALBUMIN 2.9* 2.4*   Recent Labs  Lab 01/23/20 1614  LIPASE 120*   No results for input(s): AMMONIA in the last 168 hours. CBC: Recent Labs  Lab 01/23/20 1614 01/23/20 1614 01/24/20 0613 01/25/20 0714 01/26/20 0844 01/27/20 0531 01/28/20 0626  WBC 12.2*   < > 6.8 5.9 6.5 5.2 8.6  NEUTROABS 10.7*  --   --   --   --   --   --   HGB 10.9*   < > 9.2* 10.0* 11.1* 10.1* 10.2*  HCT 33.1*   < > 29.5* 30.8* 35.0* 31.8* 32.2*  MCV 81.1   < > 82.2 82.6 82.7 83.0 82.8  PLT 250   < > 225 263 341 298 319   < > = values in this interval not displayed.   Cardiac Enzymes: No results for input(s): CKTOTAL, CKMB, CKMBINDEX, TROPONINI in the last 168 hours. BNP: Invalid input(s): POCBNP CBG: No results for input(s): GLUCAP in the last 168 hours. D-Dimer No results for input(s): DDIMER in the last 72 hours. Hgb A1c No results for input(s): HGBA1C in the last 72 hours. Lipid Profile No results for input(s): CHOL, HDL, LDLCALC, TRIG, CHOLHDL, LDLDIRECT in the last 72 hours. Thyroid function studies No results for input(s): TSH, T4TOTAL, T3FREE,  THYROIDAB in the last 72 hours.  Invalid input(s): FREET3 Anemia work up No results for input(s): VITAMINB12, FOLATE, FERRITIN, TIBC, IRON, RETICCTPCT in the last 72 hours. Urinalysis    Component Value Date/Time   COLORURINE YELLOW 01/23/2020 1454   APPEARANCEUR CLEAR 01/23/2020 1454   APPEARANCEUR Clear 11/07/2019 1557   LABSPEC 1.011 01/23/2020 1454   PHURINE 6.0 01/23/2020 1454   GLUCOSEU NEGATIVE 01/23/2020 1454   HGBUR SMALL (A) 01/23/2020 1454    BILIRUBINUR NEGATIVE 01/23/2020 1454   BILIRUBINUR Negative 11/07/2019 1557   KETONESUR 5 (A) 01/23/2020 1454   PROTEINUR NEGATIVE 01/23/2020 1454   NITRITE NEGATIVE 01/23/2020 1454   LEUKOCYTESUR MODERATE (A) 01/23/2020 1454   Sepsis Labs Invalid input(s): PROCALCITONIN,  WBC,  LACTICIDVEN Microbiology Recent Results (from the past 240 hour(s))  Blood culture (routine x 2)     Status: None   Collection Time: 01/23/20  4:36 PM   Specimen: Left Antecubital; Blood  Result Value Ref Range Status   Specimen Description LEFT ANTECUBITAL  Final   Special Requests   Final    BOTTLES DRAWN AEROBIC AND ANAEROBIC Blood Culture adequate volume   Culture   Final    NO GROWTH 5 DAYS Performed at [facility], [address], [place], [place] [address]    Report Status 01/28/2020 FINAL  Final  Blood culture (routine x 2)     Status: None   Collection Time: 01/23/20  4:36 PM   Specimen: Left Antecubital; Blood  Result Value Ref Range Status   Specimen Description LEFT ANTECUBITAL  Final   Special Requests   Final    BOTTLES DRAWN AEROBIC AND ANAEROBIC Blood Culture adequate volume   Culture   Final    NO GROWTH 5 DAYS Performed at [facility], [address] [place], [place] [address]    Report Status 01/28/2020 FINAL  Final  SARS Coronavirus 2 by RT PCR (hospital order, performed in [facility] hospital lab) Nasopharyngeal Nasopharyngeal Swab     Status: None   Collection Time: 01/23/20  5:32 PM   Specimen: Nasopharyngeal Swab  Result Value Ref Range Status   SARS Coronavirus 2 NEGATIVE NEGATIVE Final    Comment: (NOTE) SARS-CoV-2 target nucleic acids are NOT DETECTED.  The SARS-CoV-2 RNA is generally detectable in upper and lower respiratory specimens during the acute phase of infection. The lowest concentration of SARS-CoV-2 viral copies this assay can detect is 250 copies / mL. A negative result does not preclude SARS-CoV-2 infection and should not be used as the sole basis for  treatment or other patient management decisions.  A negative result may occur with improper specimen collection / handling, submission of specimen other than nasopharyngeal swab, presence of viral mutation(s) within the areas targeted by this assay, and inadequate number of viral copies (<250 copies / mL). A negative result must be combined with clinical observations, patient history, and epidemiological information.  Fact Sheet for Patients:   BoilerBrush.com.cy  Fact Sheet for Healthcare Providers: https://pope.com/  This test is not yet approved or  cleared by the [place] FDA and has been authorized for detection and/or diagnosis of SARS-CoV-2 by FDA under an Emergency Use Authorization (EUA).  This EUA will remain in effect (meaning this test can be used) for the duration of the COVID-19 declaration under Section 564(b)(1) of the Act, 21 U.S.C. section 360bbb-3(b)(1), unless the authorization is terminated or revoked sooner.  Performed at [facility], [address], [place], [place] [address]   Surgical pcr screen  Status: Abnormal   Collection Time: 01/26/20  2:59 PM   Specimen: Nasal Mucosa; Nasal Swab  Result Value Ref Range Status   MRSA, PCR NEGATIVE NEGATIVE Final   Staphylococcus aureus POSITIVE (A) NEGATIVE Final    Comment: RESULT CALLED TO, READ BACK BY AND VERIFIED WITH: HOWERTON M @ 1712 ON [id number] BY HENDERSON L. (NOTE) The Xpert SA Assay (FDA approved for NASAL specimens in patients 56 years of age and older), is one component of a comprehensive surveillance program. It is not intended to diagnose infection nor to guide or monitor treatment. Performed at [facility], [address], [place], [place] [address]    Time coordinating discharge: 32 minutes   SIGNED:  Standley Dakins, MD  [facility] 01/28/2020, 12:49 PM How to contact the [facility] Attending or Consulting provider 7A - 7P or covering  provider during after hours 7P -7A, for this patient?  1. Check the care team in [facility] and look for a) attending/consulting [facility] provider listed and b) the [facility] team listed 2. Log into [email or web address] and use [facility]'s universal password to access. If you do not have the password, please contact the hospital operator. 3. Locate the [facility] provider you are looking for under [facility] and page to a number that you can be directly reached. 4. If you still have difficulty reaching the provider, please page the [facility] (Director on Call) for the Hospitalists listed on amion for assistance.

## 2020-01-28 NOTE — Progress Notes (Signed)
Patient had repair of ventral hernia by Dr. Henreitta Leber yesterday. Patient states she is feeling better.  She did not experience nausea vomiting or abdominal pain with breakfast and lunch. She is to resume Eliquis/apixaban today. Patient is getting ready to go home today.  We will arrange for CBC and serum albumin in 2 weeks and office visit in 4 weeks.

## 2020-01-28 NOTE — Telephone Encounter (Signed)
Left multiple messages to return call - MyChart message sent

## 2020-01-28 NOTE — Discharge Instructions (Signed)
Discharge Instructions Hernia:  Common Complaints: Pain at the incision site is common. This will improve with time. Take your pain medications as described below. Some nausea is common and poor appetite. The main goal is to stay hydrated the first few days after surgery.   Diet/ Activity: Diet as tolerated. You may not have an appetite, but it is important to stay hydrated. Drink 64 ounces of water a day. Your appetite will return with time.  Shower per your regular routine daily.  Do not take hot showers. Take warm showers that are less than 10 minutes. Rest and listen to your body, but do not remain in bed all day.Walk everyday for at least 15-20 minutes.  Deep cough and move around every 1-2 hours in the first few days after surgery. Do not pick at the dermabond glue on your incision sites.  This glue film will remain in place for 1-2 weeks and will start to peel off. Do not place lotions or balms on your incision unless instructed to specifically by Dr. Constance Haw. Do not lift > 10 lbs, perform excessive bending, pushing, pulling, squatting for 6-8 weeks after surgery. Where your abdominal binder with activity as much as possible. The activity restrictions and the abdominal binder are to prevent hernia formation at your incision while you are healing.   Pain Expectations and Narcotics: -After surgery you will have pain associated with your incisions and this is normal. The pain is muscular and nerve pain, and will get better with time. -You are encouraged and expected to take non narcotic medications like tylenol and ibuprofen (when able) to treat pain as multiple modalities can aid with pain treatment. -Narcotics are only used when pain is severe or there is breakthrough pain. -You are not expected to have a pain score of 0 after surgery, as we cannot prevent pain. A pain score of 3-4 that allows you to be functional, move, walk, and tolerate some activity is the goal. The pain will continue  to improve over the days after surgery and is dependent on your surgery. -Due to  law, we are only able to give a certain amount of pain medication to treat post operative pain, and we only give additional narcotics on a patient by patient basis.  -For most laparoscopic surgery, studies have shown that the majority of patients only need 10-15 narcotic pills, and for open surgeries most patients only need 15-20.   -Having appropriate expectations of pain and knowledge of pain management with non narcotics is important as we do not want anyone to become addicted to narcotic pain medication.  -Using ice packs in the first 48 hours and heating pads after 48 hours, wearing an abdominal binder (when recommended), and using over the counter medications are all ways to help with pain management.   -Simple acts like meditation and mindfulness practices after surgery can also help with pain control and research has proven the benefit of these practices.  Medication: Take tylenol and ibuprofen as needed for pain control, alternating every 4-6 hours.  Example:  Tylenol '1000mg'$  @ 6am, 12noon, 6pm, 65mdnight (Do not exceed '4000mg'$  of tylenol a day). Ibuprofen '800mg'$  @ 9am, 3pm, 9pm, 3am (Do not exceed '3600mg'$  of ibuprofen a day).  Take Roxicodone for breakthrough pain every 4 hours.  Take Colace for constipation related to narcotic pain medication. If you do not have a bowel movement in 2 days, take Miralax over the counter.  Drink plenty of water to also prevent constipation.  Contact Information: If you have questions or concerns, please call our office, [phone], Monday- Thursday 8AM-5PM and Friday 8AM-12Noon.  If it is after hours or on the weekend, please call [facility], [phone], and ask to speak to the surgeon on call for Dr. Constance Haw at [facility].    Open Hernia Repair, Adult, Care After These instructions give you information about caring for yourself after your procedure. Your  doctor may also give you more specific instructions. If you have problems or questions, contact your doctor. Follow these instructions at home: Surgical cut (incision) care   Follow instructions from your doctor about how to take care of your surgical cut area. Make sure you: ? Wash your hands with soap and water before you change your bandage (dressing). If you cannot use soap and water, use hand sanitizer. ? Change your bandage as told by your doctor. ? Leave stitches (sutures), skin glue, or skin tape (adhesive) strips in place. They may need to stay in place for 2 weeks or longer. If tape strips get loose and curl up, you may trim the loose edges. Do not remove tape strips completely unless your doctor says it is okay.  Check your surgical cut every day for signs of infection. Check for: ? More redness, swelling, or pain. ? More fluid or blood. ? Warmth. ? Pus or a bad smell. Activity  Do not drive or use heavy machinery while taking prescription pain medicine. Do not drive until your doctor says it is okay.  Until your doctor says it is okay: ? Do not lift anything that is heavier than 10 lb (4.5 kg). ? Do not play contact sports.  Return to your normal activities as told by your doctor. Ask your doctor what activities are safe. General instructions  To prevent or treat having a hard time pooping (constipation) while you are taking prescription pain medicine, your doctor may recommend that you: ? Drink enough fluid to keep your pee (urine) clear or pale yellow. ? Take over-the-counter or prescription medicines. ? Eat foods that are high in fiber, such as fresh fruits and vegetables, whole grains, and beans. ? Limit foods that are high in fat and processed sugars, such as fried and sweet foods.  Take over-the-counter and prescription medicines only as told by your doctor.  Do not take baths, swim, or use a hot tub until your doctor says it is okay.  Keep all follow-up visits  as told by your doctor. This is important. Contact a doctor if:  You develop a rash.  You have more redness, swelling, or pain around your surgical cut.  You have more fluid or blood coming from your surgical cut.  Your surgical cut feels warm to the touch.  You have pus or a bad smell coming from your surgical cut.  You have a fever or chills.  You have blood in your poop (stool).  You have not pooped in 2-3 days.  Medicine does not help your pain. Get help right away if:  You have chest pain or you are short of breath.  You feel light-headed.  You feel weak and dizzy (feel faint).  You have very bad pain.  You throw up (vomit) and your pain is worse. This information is not intended to replace advice given to you by your health care provider. Make sure you discuss any questions you have with your health care provider. Document Revised: 11/23/2018 Document Reviewed: 01/13/2016 Elsevier Patient Education  [place].  IMPORTANT INFORMATION: PAY CLOSE ATTENTION   PHYSICIAN DISCHARGE INSTRUCTIONS  Follow with Primary care provider  Daphine Deutscher Mary-Margaret, FNP  and other consultants as instructed by your Hospitalist Physician  SEEK MEDICAL CARE OR RETURN TO EMERGENCY ROOM IF SYMPTOMS COME BACK, WORSEN OR NEW PROBLEM DEVELOPS   Please note: You were cared for by a hospitalist during your hospital stay. Every effort will be made to forward records to your primary care provider.  You can request that your primary care provider send for your hospital records if they have not received them.  Once you are discharged, your primary care physician will handle any further medical issues. Please note that NO REFILLS for any discharge medications will be authorized once you are discharged, as it is imperative that you return to your primary care physician (or establish a relationship with a primary care physician if you do not have one) for your post hospital discharge needs  so that they can reassess your need for medications and monitor your lab values.  Please get a complete blood count and chemistry panel checked by your Primary MD at your next visit, and again as instructed by your Primary MD.  Get Medicines reviewed and adjusted: Please take all your medications with you for your next visit with your Primary MD  Laboratory/radiological data: Please request your Primary MD to go over all hospital tests and procedure/radiological results at the follow up, please ask your primary care provider to get all Hospital records sent to his/her office.  In some cases, they will be blood work, cultures and biopsy results pending at the time of your discharge. Please request that your primary care provider follow up on these results.  If you are diabetic, please bring your blood sugar readings with you to your follow up appointment with primary care.    Please call and make your follow up appointments as soon as possible.    Also Note the following: If you experience worsening of your admission symptoms, develop shortness of breath, life threatening emergency, suicidal or homicidal thoughts you must seek medical attention immediately by calling 911 or calling your MD immediately  if symptoms less severe.  You must read complete instructions/literature along with all the possible adverse reactions/side effects for all the Medicines you take and that have been prescribed to you. Take any new Medicines after you have completely understood and accpet all the possible adverse reactions/side effects.   Do not drive when taking Pain medications or sleeping medications (Benzodiazepines)  Do not take more than prescribed Pain, Sleep and Anxiety Medications. It is not advisable to combine anxiety,sleep and pain medications without talking with your primary care practitioner  Special Instructions: If you have smoked or chewed Tobacco  in the last 2 yrs please stop smoking, stop  any regular Alcohol  and or any Recreational drug use.  Wear Seat belts while driving.  Do not drive if taking any narcotic, mind altering or controlled substances or recreational drugs or alcohol.      Failure to Thrive, Adult Failure to thrive is a group of symptoms that affect adults, including the elderly. These symptoms include eating too little and losing weight. People who have this may not want to be with friends, or they may not want to eat or drink. This condition is not a normal part of getting older. What are the causes?  A disease, such as dementia, diabetes, cancer, or lung disease.  A health problem, such as a vitamin deficiency or  a heart problem.  A disorder, such as sadness (depression).  A disability.  Medicines.  Having tooth or mouth problems.  Neglect or being treated badly.  In some cases, the cause may not be known. What are the signs or symptoms?  Loss of more than 5% of your body weight.  Being more tired than normal after an activity.  Having trouble getting up after sitting.  Not feeling hungry.  Not getting out of bed.  Not wanting to do your normal activities.  Sadness.  Getting infections often.  Bedsores.  Taking a long time to get better after an infection, injury, or a surgery.  Weakness. How is this treated? Treatment for this condition depends on the cause. It may be treated by:  Treating a disease or disorder that is causing symptoms.  Having talk therapy or taking medicine to treat sadness.  Eating better, such as eating more often or taking nutritional supplements.  Changing or stopping a medicine.  Having physical or occupational therapy. It often takes a team of doctors to find the right treatment. Follow these instructions at home:   Take over-the-counter and prescription medicines only as told by your doctor.  Eat a healthy diet. Make sure that you eat enough. Ask your doctor how much you should eat.  Be  active. Do exercises that make you stronger (strength training). A physical therapist can help to set up a program that fits you.  Make sure that you are safe at home.  Have a plan for what to do if you cannot make decisions for yourself. Contact a doctor if you:  Are not able to eat well.  Are not able to move around.  Feel very sad.  Feel very hopeless. Get help right away if:  You think about ending your life.  You cannot eat or drink.  You do not get out of bed.  Staying at home is not safe.  You have a fever. Summary  Failure to thrive is a group of symptoms that affect adults, including the elderly.  Symptoms include eating too little and losing weight.  Take all medicines only as told by your doctor.  Eat a healthy diet. Make sure that you eat enough. Ask your doctor how much you should eat.  Be active. Do exercises that make you stronger. A physical therapist can help to set up a program that is right for you. This information is not intended to replace advice given to you by your health care provider. Make sure you discuss any questions you have with your health care provider. Document Revised: 04/03/2018 Document Reviewed: 04/03/2018 Elsevier Patient Education  2020 [employer].

## 2020-01-28 NOTE — Progress Notes (Signed)
[facility]  Feeling better. Says her appetite improving, was already doing this prior to surgery but continuing now.  Incision c/d/i with dermabond  Plan to see 7/8 for follow up.  No heavy lifting > 10 lbs, excessive bending, pushing, pulling, or squatting for 6-8 weeks after surgery.   Algis Greenhouse, MD [facility] [address] [place], [place] [address] [phone] (office)

## 2020-01-28 NOTE — Progress Notes (Signed)
Discharge instructions reviewed with patient by Carmon Sails, RN and IV site removed. AVS given. Prescriptions sent to pharmacy. Patient left floor in stable condition via w/c accompanied by nurse tech. Discharged home.

## 2020-01-28 NOTE — Evaluation (Signed)
Physical Therapy Evaluation Patient Details Name: Christina [name] MRN: [id number] DOB: 1951/04/26 Today's Date: 01/28/2020   History of Present Illness  Christina [name] is an 69 y.o. female s/p Epigastric hernia repair with meshon 01/28/20 with PMH significant for HTN and hypercoagulable state on Eliquis who was admitted primarily because family refused to take her home because they are so frustrated with patient's ongoing weight loss nausea vomiting and diarrhea which has been worked up at [facility] and is awaiting outpatient colonoscopy and EGD. Patient states that her weight loss issues date back to 2019 when she initially tried to lose weight because she weighed about 235 pounds as far she remembers.  However over the past year and increasingly over the past 6 months patient has had significant increase in her weight loss even though she is no longer trying to lose weight.  She believes she now weighs 150 pounds.  Patient also notes she has chronic diarrhea nausea vomiting anorexia and initially had loss of taste and smell.  She notes all of the symptoms have worsened significantly since she had her knee replaced in January.  Patient was seen at [facility] and was diagnosed with gastroenteritis and treated conservatively with some improvement of symptoms.  CT scan of the abdomen pelvis was unrevealing.  Patient returned to [facility] last week with recurrent diarrhea.  C. difficile was ruled out at the time.  She was treated with Cipro/Flagyl with improvement in symptoms and she was discharged on 530.  Patient is now brought back into ED by family who is very frustrated that patient continues to have persistent diarrhea, nausea and vomiting and persistent weight loss.  They would like to have something done and do not want a wait for outpatient colonoscopy and EGD.    Clinical Impression  Patient functioning near baseline for functional mobility and gait, demonstrates good return for ambulation  in room and hallways without loss of balance and encouraged to ambulate ad lib during length of hospital stay.  Plan:  Patient discharged from physical therapy to care of nursing for ambulation daily as tolerated for length of stay.     Follow Up Recommendations No PT follow up    Equipment Recommendations  None recommended by PT    Recommendations for Other Services       Precautions / Restrictions Precautions Precautions: None Restrictions Weight Bearing Restrictions: No      Mobility  Bed Mobility Overal bed mobility: Modified Independent             General bed mobility comments: demonstrates good return for rolling onto side and sitting up from sidelying position  Transfers Overall transfer level: Modified independent                  Ambulation/Gait Ambulation/Gait assistance: Modified independent (Device/Increase time) Gait Distance (Feet): 300 Feet Assistive device: None Gait Pattern/deviations: WFL(Within Functional Limits) Gait velocity: slightly decreased   General Gait Details: demonstrates good return for ambulation on level, inclined and declined surfaces without loss of balance  Stairs            Wheelchair Mobility    Modified Rankin (Stroke Patients Only)       Balance Overall balance assessment: No apparent balance deficits (not formally assessed)  Pertinent Vitals/Pain Pain Assessment: No/denies pain    Home Living Family/patient expects to be discharged to:: Private residence Living Arrangements: Spouse/significant other Available Help at Discharge: Family;Available 24 hours/day Type of Home: House Home Access: Stairs to enter Entrance Stairs-Rails: None Entrance Stairs-Number of Steps: 1 Home Layout: One level Home Equipment: Cane - single point;Bedside commode;Walker - 2 wheels      Prior Function Level of Independence: Independent          Comments: 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[place], [place]   Dominant Hand: Right    Extremity/Trunk Assessment   Upper Extremity Assessment Upper Extremity Assessment: Overall WFL for tasks assessed    Lower Extremity Assessment Lower Extremity Assessment: Overall WFL for tasks assessed    Cervical / Trunk Assessment Cervical / Trunk Assessment: Normal  Communication   Communication: No difficulties  Cognition Arousal/Alertness: Awake/alert Behavior During Therapy: WFL for tasks assessed/performed Overall Cognitive Status: Within Functional Limits for tasks assessed                                        General Comments      Exercises     Assessment/Plan    PT Assessment Patent does not need any further PT services  PT Problem List         PT Treatment Interventions      PT Goals (Current goals can be found in the Care Plan section)  Acute Rehab PT Goals Patient Stated Goal: return home with family to assist PT Goal Formulation: With patient Time For Goal Achievement: 01/28/20 Potential to Achieve Goals: Good    Frequency     Barriers to discharge        Co-evaluation               AM-PAC PT "6 Clicks" Mobility  Outcome Measure Help needed turning from your back to your side while in a flat bed without using bedrails?: None Help needed moving from lying on your back to sitting on the side of a flat bed without using bedrails?: None Help needed moving to and from a bed to a chair (including a wheelchair)?: None Help needed standing up from a chair using your arms (e.g., wheelchair or bedside chair)?: None Help needed to walk in hospital room?: None Help needed climbing 3-5 steps with a railing? : None 6 Click Score: 24    End of Session   Activity Tolerance: Patient tolerated treatment well Patient left: in chair;with call bell/phone within reach Nurse Communication: Mobility  status PT Visit Diagnosis: Unsteadiness on feet (R26.81);Other abnormalities of gait and mobility (R26.89);Muscle weakness (generalized) (M62.81)    Time: 1025-8527 PT Time Calculation (min) (ACUTE ONLY): 24 min   Charges:   PT Evaluation $PT Eval Moderate Complexity: 1 Mod PT Treatments $Therapeutic Activity: 23-37 mins        12:22 PM, 01/28/20 Lonell Grandchild, MPT Physical Therapist with [facility] [phone] office [phone] mobile phone

## 2020-01-30 ENCOUNTER — Telehealth: Payer: Self-pay | Admitting: Nurse Practitioner

## 2020-01-30 ENCOUNTER — Telehealth (INDEPENDENT_AMBULATORY_CARE_PROVIDER_SITE_OTHER): Payer: Self-pay | Admitting: Internal Medicine

## 2020-01-30 NOTE — Telephone Encounter (Signed)
Appointment scheduled 02/07/2020 with Bennie Pierini, patient aware.

## 2020-01-30 NOTE — Telephone Encounter (Signed)
Pt needing HFU within a week. Released from [facility] on 01/28/20.

## 2020-01-30 NOTE — Telephone Encounter (Signed)
Patient called stated she just got out of the hospital and thought she was to have some labs drawn - please advise - ph# [phone]

## 2020-01-30 NOTE — Telephone Encounter (Signed)
Per Dr.Rehman the patient will need to have lab work in 2 weeks and a office visit in 4 weeks. She may go to [facility] in 2 weeks, July 1 and have her lab work drawn. The orders have been released. Please let the patient know.

## 2020-01-31 ENCOUNTER — Other Ambulatory Visit: Payer: Self-pay | Admitting: Nurse Practitioner

## 2020-01-31 DIAGNOSIS — I10 Essential (primary) hypertension: Secondary | ICD-10-CM

## 2020-02-06 ENCOUNTER — Encounter (INDEPENDENT_AMBULATORY_CARE_PROVIDER_SITE_OTHER): Payer: Self-pay | Admitting: *Deleted

## 2020-02-06 ENCOUNTER — Telehealth (INDEPENDENT_AMBULATORY_CARE_PROVIDER_SITE_OTHER): Payer: Self-pay | Admitting: *Deleted

## 2020-02-06 NOTE — Telephone Encounter (Signed)
Per Dr.Rehman the patient will need to have lab work in 2 weeks and a office visit in 4 weeks. She may go to [facility] in 2 weeks, July 1 and have her lab work drawn. The orders have been released. Please let the patient know.  Make sure patient is aware of this and I will send her a MyChart message too  Her appointment is with Liborio Nixon on 02/18/2020 at 2:00

## 2020-02-07 ENCOUNTER — Ambulatory Visit: Payer: BC Managed Care – PPO | Admitting: Nurse Practitioner

## 2020-02-07 ENCOUNTER — Encounter: Payer: Self-pay | Admitting: Nurse Practitioner

## 2020-02-07 ENCOUNTER — Other Ambulatory Visit: Payer: Self-pay

## 2020-02-07 VITALS — BP 141/91 | HR 122 | Temp 97.8°F | Resp 20 | Ht 69.0 in | Wt 144.0 lb

## 2020-02-07 DIAGNOSIS — R634 Abnormal weight loss: Secondary | ICD-10-CM | POA: Diagnosis not present

## 2020-02-07 DIAGNOSIS — Z09 Encounter for follow-up examination after completed treatment for conditions other than malignant neoplasm: Secondary | ICD-10-CM | POA: Diagnosis not present

## 2020-02-07 NOTE — Progress Notes (Signed)
   Subjective:    Patient ID: Christina [name], female    DOB: March 20, 1951, 69 y.o.   MRN: [id number]   Chief Complaint: Hospitalization Follow-up   HPI Patient went to hospital with nausea and vomiting with abdominal pain. Patient has been losing weight over the last 6 months. She was diagnosed with epigastric hernia. Had repair on 01/27/20. They still are not able to determine what was causing weight loss. But she says since she had hernia , she says she is now hungry and food is starting to taste good again. She feels pretty good today.   Review of Systems  Constitutional: Negative for diaphoresis.  Eyes: Negative for pain.  Respiratory: Negative for shortness of breath.   Cardiovascular: Negative for chest pain, palpitations and leg swelling.  Gastrointestinal: Negative for abdominal pain, nausea and vomiting.  Endocrine: Negative for polydipsia.  Skin: Negative for rash.  Neurological: Negative for dizziness, weakness and headaches.  Hematological: Does not bruise/bleed easily.  All other systems reviewed and are negative.      Objective:   Physical Exam Vitals and nursing note reviewed.  Constitutional:      Appearance: Normal appearance.  Cardiovascular:     Rate and Rhythm: Normal rate and regular rhythm.     Heart sounds: Normal heart sounds.  Pulmonary:     Breath sounds: Normal breath sounds.  Abdominal:     General: Bowel sounds are normal. There is no distension.     Tenderness: There is abdominal tenderness (mild diffuse).  Skin:    General: Skin is warm.  Neurological:     General: No focal deficit present.     Mental Status: She is alert and oriented to person, place, and time.     BP (!) 141/91   Pulse (!) 122   Temp 97.8 F (36.6 C) (Temporal)   Resp 20   Ht _0  (1.753 m)   Wt 144 lb (65.3 kg)   LMP  (LMP Unknown)   SpO2 99%   BMI 21.27 kg/m       Assessment & Plan:  [name] in today with chief complaint of  Hospitalization Follow-up   1. S/P epigastric hernia repair, follow-up exam Labs pending - CBC with Differential/Platelet - CMP14+EGFR  2. Hospital discharge follow-up Hospital records reviewed    The above assessment and management plan was discussed with the patient. The patient verbalized understanding of and has agreed to the management plan. Patient is aware to call the clinic if symptoms persist or worsen. Patient is aware when to return to the clinic for a follow-up visit. Patient educated on when it is appropriate to go to the emergency department.   Mary-Margaret Hassell Done, FNP

## 2020-02-08 LAB — CBC WITH DIFFERENTIAL/PLATELET
Basophils Absolute: 0 10*3/uL (ref 0.0–0.2)
Basos: 0 %
EOS (ABSOLUTE): 0.1 10*3/uL (ref 0.0–0.4)
Eos: 2 %
Hematocrit: 35.8 % (ref 34.0–46.6)
Hemoglobin: 11.8 g/dL (ref 11.1–15.9)
Immature Grans (Abs): 0.1 10*3/uL (ref 0.0–0.1)
Immature Granulocytes: 1 %
Lymphocytes Absolute: 1.2 10*3/uL (ref 0.7–3.1)
Lymphs: 17 %
MCH: 26.4 pg — ABNORMAL LOW (ref 26.6–33.0)
MCHC: 33 g/dL (ref 31.5–35.7)
MCV: 80 fL (ref 79–97)
Monocytes Absolute: 0.5 10*3/uL (ref 0.1–0.9)
Monocytes: 7 %
Neutrophils Absolute: 5 10*3/uL (ref 1.4–7.0)
Neutrophils: 73 %
Platelets: 396 10*3/uL (ref 150–450)
RBC: 4.47 x10E6/uL (ref 3.77–5.28)
RDW: 15.5 % — ABNORMAL HIGH (ref 11.7–15.4)
WBC: 6.9 10*3/uL (ref 3.4–10.8)

## 2020-02-08 LAB — CMP14+EGFR
ALT: 21 IU/L (ref 0–32)
AST: 47 IU/L — ABNORMAL HIGH (ref 0–40)
Albumin/Globulin Ratio: 1.3 (ref 1.2–2.2)
Albumin: 3.4 g/dL — ABNORMAL LOW (ref 3.7–4.7)
Alkaline Phosphatase: 87 IU/L (ref 48–121)
BUN/Creatinine Ratio: 13 (ref 12–28)
BUN: 10 mg/dL (ref 8–27)
Bilirubin Total: 0.6 mg/dL (ref 0.0–1.2)
CO2: 21 mmol/L (ref 20–29)
Calcium: 9.4 mg/dL (ref 8.7–10.3)
Chloride: 110 mmol/L — ABNORMAL HIGH (ref 96–106)
Creatinine, Ser: 0.8 mg/dL (ref 0.57–1.00)
GFR calc Af Amer: 86 mL/min/{1.73_m2} (ref >59)
GFR calc non Af Amer: 74 mL/min/{1.73_m2} (ref >59)
Globulin, Total: 2.7 g/dL (ref 1.5–4.5)
Glucose: 86 mg/dL (ref 65–99)
Potassium: 3.9 mmol/L (ref 3.5–5.2)
Sodium: 146 mmol/L — ABNORMAL HIGH (ref 134–144)
Total Protein: 6.1 g/dL (ref 6.0–8.5)

## 2020-02-18 ENCOUNTER — Ambulatory Visit (INDEPENDENT_AMBULATORY_CARE_PROVIDER_SITE_OTHER): Payer: BC Managed Care – PPO | Admitting: Gastroenterology

## 2020-02-18 ENCOUNTER — Other Ambulatory Visit: Payer: Self-pay

## 2020-02-18 ENCOUNTER — Encounter (INDEPENDENT_AMBULATORY_CARE_PROVIDER_SITE_OTHER): Payer: Self-pay | Admitting: Gastroenterology

## 2020-02-18 DIAGNOSIS — K219 Gastro-esophageal reflux disease without esophagitis: Secondary | ICD-10-CM

## 2020-02-18 DIAGNOSIS — Z1211 Encounter for screening for malignant neoplasm of colon: Secondary | ICD-10-CM | POA: Diagnosis not present

## 2020-02-18 DIAGNOSIS — R1013 Epigastric pain: Secondary | ICD-10-CM

## 2020-02-18 NOTE — Progress Notes (Signed)
Patient profile: Christina Christina [name] is a 69 y.o. female last seen for inpatient by Dr Karilyn Cota mid June 2021.   History of Present Illness: Christina Christina [name] is seen today for hospital follow-up. She was admitted and had epigastric hernia repair for symptoms of intermittent abd pain, nausea, vomiting. Had lost approximately 50lbs and PPI did not control symptoms. CT revealed ventral hernia w/ omentum and fluid/inflammatory changes.  On June 14th she had epigastric hernia repair with Dr. Henreitta Leber.  She was discharged home on June 15.   Seen today for follow-up and reports she is doing remarkably well.  She feels like she is able to eat foods and enjoy them again. Her appeite has returned to normal. She is not having any pain w/ eating. Denies any GERD symptoms, she takes Protonix 3 times a week to prevent symptoms when she eats spicy foods. No n/v/dysphagia.  Denies any epigastric pain.  She reports bowels are about every other day. No blood in stool, black stools, lower abdominal pain.  Uses stool softeners as needed  Feels her "surgery was a miracle" and feels great.  No concerns today.   Wt Readings from Last 3 Encounters:  02/07/20 144 lb (65.3 kg)  01/27/20 125 lb (56.7 kg)  12/19/19 161 lb 12.8 oz (73.4 kg)     Past Medical History:  Past Medical History:  Diagnosis Date  . Allergic rhinitis   . Clotting disorder (HCC)   . DVT (deep venous thrombosis) (HCC) 1990's  . DVT (deep venous thrombosis) (HCC) 1980s   in the leg behind my knee;  right   . Epicondylitis    right   . GERD (gastroesophageal reflux disease)   . Hyperlipidemia   . Hypertension   . Osteoarthritis   . Pulmonary embolism (HCC) 1990's    Problem List: Patient Active Problem List   Diagnosis Date Noted  . Abnormal weight Christina [name] 01/25/2020  . Incarcerated epigastric hernia   . Nausea vomiting and diarrhea   . Weight Christina [name], unintentional 01/23/2020  . Unexplained weight Christina [name] 01/23/2020  . Abnormal LFTs  09/03/2019  . Status post total knee replacement, left 08/23/2019  . Status post total knee replacement, right 08/23/2019  . Acquired cyst of kidney 06/03/2019  . Osteoarthritis of left knee 06/12/2018  . Osteoarthritis of right knee 06/12/2018  . Acute idiopathic gout involving toe of left foot 04/13/2018  . Peripheral edema 04/13/2018  . Pain in left knee 10/11/2017  . On anticoagulant therapy 03/05/2014  . History of DVT of lower extremity 11/01/2012  . Hyperlipidemia with target LDL less than 100 05/12/2010  . BMI 35.0-35.9,adult 05/12/2010  . Essential hypertension, benign 05/12/2010  . ABNORMAL STRESS ELECTROCARDIOGRAM 05/12/2010    Past Surgical History: Past Surgical History:  Procedure Laterality Date  . COLONOSCOPY  2015  . CYSTECTOMY     From shoulder and neck  . KNEE ARTHROSCOPY Left    Dr Ranell Patrick  . SKIN GRAFT     ulcer on right leg   . TOTAL KNEE ARTHROPLASTY Right 08/23/2019   Procedure: TOTAL KNEE ARTHROPLASTY;  Surgeon: Beverely Low, MD;  Location: [facility];  Service: Orthopedics;  Laterality: Right;  . TUBAL LIGATION     Multiple  . UMBILICAL HERNIA REPAIR N/A 01/27/2020   Procedure: EPIGASTRIC HERNIA REPAIR WITH MESH;  Surgeon: Lucretia Roers, MD;  Location: [facility];  Service: General;  Laterality: N/A;  epicastric site    Allergies: Allergies  Allergen Reactions  . Ace Inhibitors Cough  Home Medications:  Current Outpatient Medications:  .  acetaminophen (TYLENOL) 325 MG tablet, Take 2 tablets (650 mg total) by mouth every 6 (six) hours as needed for mild pain (or Fever >/= 101)., Disp: , Rfl:  .  apixaban (ELIQUIS) 5 MG TABS tablet, Take 1 tablet by mouth 2 (two) times daily., Disp: , Rfl:  .  diclofenac sodium (VOLTAREN) 1 % GEL, Apply 2 g topically 3 (three) times daily as needed (knee pain)., Disp: , Rfl:  .  pantoprazole (PROTONIX) 40 MG tablet, TAKE 1 TABLET BY MOUTH EVERY DAY, Disp: 90 tablet, Rfl: 0 .  Prenatal Vit-Fe Fumarate-FA  (PRENATAL VITAMINS) 28-0.8 MG TABS, Take 1 tablet by mouth daily., Disp: 30 tablet, Rfl: 3 .  docusate sodium (COLACE) 100 MG capsule, Take 1 capsule (100 mg total) by mouth 2 (two) times daily as needed for mild constipation. (Patient not taking: Reported on 02/18/2020), Disp: 10 capsule, Rfl: 0   Family History: family history includes Cancer in her father; Deep vein thrombosis in her daughter and mother; Diabetes in her mother; Heart attack in her mother; Heart disease in her mother; Hyperlipidemia in her mother and sister.    Social History:   reports that she quit smoking about 40 years ago. She has never used smokeless tobacco. She reports that she does not drink alcohol and does not use drugs.   Review of Systems: Constitutional: + Weight Christina [name] prior to surgery and has regained weight since. Eyes: No changes in vision. ENT: No oral lesions, sore throat.  GI: see HPI.  Heme/Lymph: No easy bruising.  CV: No chest pain.  GU: No hematuria.  Integumentary: No rashes.  Neuro: No headaches.  Psych: No depression/anxiety.  Endocrine: No heat/cold intolerance.  Allergic/Immunologic: No urticaria.  Resp: No cough, SOB.  Musculoskeletal: No joint swelling.    Physical Examination: LMP  (LMP Unknown)  Gen: NAD, alert and oriented x 4 HEENT: PEERLA, EOMI, Neck: supple, no JVD Chest: CTA bilaterally, no wheezes, crackles, or other adventitious sounds CV: RRR, no m/g/c/r Abd: soft, NT, ND, +BS in all four quadrants; no HSM, guarding, ridigity, or rebound tenderness Ext: no edema, well perfused with 2+ pulses, Skin: no rash or lesions noted on observed skin Lymph: no noted LAD  Data Reviewed:   Colonoscopy 2015-normal. Due 2025  Assessment/Plan: Christina Christina [name] is a 69 y.o. female   1.  Epigastric pain/nausea vomiting-symptoms have completely resolved with her hernia repair.  She is feeling remarkably well.  She denies any current GI symptoms.  She currently uses her PPI on an as-needed  basis for symptoms 2-3 times a week.  Recent labs reveal a normal hemoglobin and albumin of 3.4.  She feels she is eating well and is gaining weight post op.  2.  Colon cancer screening-due for repeat 2025.  She has a follow-up appointment in January 2022 with Dr. Karilyn Cota.  She is encouraged to call sooner with any questions or concerns/worsening symptoms.   Christina Christina [name] was seen today for hospitalization follow-up.  Diagnoses and all orders for this visit:  Epigastric pain  Colon cancer screening  Chronic GERD     I personally performed the service, non-incident to. (WP)  Tawni Pummel, [facility]

## 2020-02-18 NOTE — Patient Instructions (Signed)
Monitor blood pressure at home and notify primary care doctor if stays high. Okay to use protonix/pantoprazole. Call us with any new or worsening symptoms.

## 2020-02-19 ENCOUNTER — Telehealth: Payer: Self-pay | Admitting: Nurse Practitioner

## 2020-02-19 NOTE — Telephone Encounter (Signed)
Appt made wanted to wait on mmm

## 2020-02-19 NOTE — Telephone Encounter (Signed)
  Incoming Patient Call  02/19/2020  What symptoms do you have? Left hand still hurting inside wrist and swelling and gets hot sometimes  How long have you been sick? 2-3 weeks  Have you been seen for this problem? NO  If your provider decides to give you a prescription, which pharmacy would you like for it to be sent to? [facility]   Patient informed that this information will be sent to the clinical staff for review and that they should receive a follow up call.

## 2020-02-20 ENCOUNTER — Encounter: Payer: Self-pay | Admitting: General Surgery

## 2020-02-20 ENCOUNTER — Other Ambulatory Visit: Payer: Self-pay

## 2020-02-20 ENCOUNTER — Ambulatory Visit (INDEPENDENT_AMBULATORY_CARE_PROVIDER_SITE_OTHER): Payer: Self-pay | Admitting: General Surgery

## 2020-02-20 VITALS — BP 155/100 | HR 91 | Temp 98.2°F | Resp 16 | Ht 69.0 in | Wt 147.0 lb

## 2020-02-20 DIAGNOSIS — K436 Other and unspecified ventral hernia with obstruction, without gangrene: Secondary | ICD-10-CM

## 2020-02-21 NOTE — Progress Notes (Signed)
[facility] Note   HPI:  69 y.o. Female presents to clinic for post-op follow-up evaluation after epigastric hernia repair. Feeling great and having better appetite.  Review of Systems:  No fever or chills No drainage from wound All other review of systems: otherwise negative   Vital Signs:  BP (!) 155/100   Pulse 91   Temp 98.2 F (36.8 C) (Temporal)   Resp 16   Ht 5\' 9"  (1.753 m)   Wt 147 lb (66.7 kg)   LMP  (LMP Unknown)   SpO2 97%   BMI 21.71 kg/m    Physical Exam:  Physical Exam Vitals reviewed.  HENT:     Head: Normocephalic.  Cardiovascular:     Rate and Rhythm: Normal rate.  Pulmonary:     Effort: Pulmonary effort is normal.  Abdominal:     General: There is no distension.     Palpations: Abdomen is soft.     Tenderness: There is no abdominal tenderness.     Hernia: No hernia is present.     Comments: Superior incision, minor skin erosion from glue coming off, no erythema or drainage  Musculoskeletal:        General: No swelling.  Skin:    General: Skin is warm.  Neurological:     Mental Status: She is alert.     Assessment:  69 y.o. yo Female s/p epigastric hernia repair doing well. Has minor skin erosion superior but no signs of infection.  Plan:  - Neosporin to area to keep it moist   - No heavy lifting > 10 lbs, excessive bending, pushing, pulling, or squatting for 6-8 weeks after surgery.   69, MD [facility] [address] [place], [place] [phone] (office)

## 2020-02-22 ENCOUNTER — Other Ambulatory Visit: Payer: Self-pay

## 2020-02-22 ENCOUNTER — Observation Stay (HOSPITAL_COMMUNITY)
Admission: EM | Admit: 2020-02-22 | Discharge: 2020-02-24 | Disposition: A | Discharge status: Home / Self Care | Payer: BC Managed Care – PPO | Attending: Family Medicine | Admitting: Family Medicine

## 2020-02-22 ENCOUNTER — Encounter (HOSPITAL_COMMUNITY): Payer: Self-pay | Admitting: Emergency Medicine

## 2020-02-22 DIAGNOSIS — R197 Diarrhea, unspecified: Secondary | ICD-10-CM

## 2020-02-22 DIAGNOSIS — R000 Tachycardia, unspecified: Secondary | ICD-10-CM | POA: Insufficient documentation

## 2020-02-22 DIAGNOSIS — Z79899 Other long term (current) drug therapy: Secondary | ICD-10-CM | POA: Insufficient documentation

## 2020-02-22 DIAGNOSIS — E872 Acidosis, unspecified: Secondary | ICD-10-CM | POA: Diagnosis present

## 2020-02-22 DIAGNOSIS — Z87891 Personal history of nicotine dependence: Secondary | ICD-10-CM | POA: Diagnosis not present

## 2020-02-22 DIAGNOSIS — N8502 Endometrial intraepithelial neoplasia [EIN]: Secondary | ICD-10-CM | POA: Diagnosis not present

## 2020-02-22 DIAGNOSIS — Z20822 Contact with and (suspected) exposure to covid-19: Secondary | ICD-10-CM | POA: Insufficient documentation

## 2020-02-22 DIAGNOSIS — K529 Noninfective gastroenteritis and colitis, unspecified: Secondary | ICD-10-CM | POA: Diagnosis not present

## 2020-02-22 DIAGNOSIS — R112 Nausea with vomiting, unspecified: Secondary | ICD-10-CM | POA: Diagnosis present

## 2020-02-22 DIAGNOSIS — K469 Unspecified abdominal hernia without obstruction or gangrene: Secondary | ICD-10-CM | POA: Diagnosis not present

## 2020-02-22 DIAGNOSIS — N859 Noninflammatory disorder of uterus, unspecified: Secondary | ICD-10-CM | POA: Diagnosis present

## 2020-02-22 DIAGNOSIS — G8918 Other acute postprocedural pain: Principal | ICD-10-CM | POA: Diagnosis present

## 2020-02-22 DIAGNOSIS — Z86718 Personal history of other venous thrombosis and embolism: Secondary | ICD-10-CM

## 2020-02-22 DIAGNOSIS — Z7901 Long term (current) use of anticoagulants: Secondary | ICD-10-CM

## 2020-02-22 DIAGNOSIS — Z96651 Presence of right artificial knee joint: Secondary | ICD-10-CM | POA: Diagnosis not present

## 2020-02-22 DIAGNOSIS — I10 Essential (primary) hypertension: Secondary | ICD-10-CM | POA: Insufficient documentation

## 2020-02-22 NOTE — ED Triage Notes (Signed)
Pt with vomiting and diarrhea that started after eating some fried chicken earlier in day. Pt reports 4 episodes of both vomiting and diarrhea. Has a hx of GERD but did not take any of her meds for this. EMS gave 4mg  Zofran IV enroute and now pt denies nausea.

## 2020-02-23 ENCOUNTER — Emergency Department (HOSPITAL_COMMUNITY): Payer: BC Managed Care – PPO

## 2020-02-23 ENCOUNTER — Other Ambulatory Visit: Payer: Self-pay

## 2020-02-23 DIAGNOSIS — Z86718 Personal history of other venous thrombosis and embolism: Secondary | ICD-10-CM | POA: Diagnosis not present

## 2020-02-23 DIAGNOSIS — E872 Acidosis, unspecified: Secondary | ICD-10-CM | POA: Diagnosis present

## 2020-02-23 DIAGNOSIS — R112 Nausea with vomiting, unspecified: Secondary | ICD-10-CM | POA: Diagnosis not present

## 2020-02-23 DIAGNOSIS — G8918 Other acute postprocedural pain: Secondary | ICD-10-CM | POA: Diagnosis present

## 2020-02-23 DIAGNOSIS — N949 Unspecified condition associated with female genital organs and menstrual cycle: Secondary | ICD-10-CM | POA: Diagnosis not present

## 2020-02-23 DIAGNOSIS — K529 Noninfective gastroenteritis and colitis, unspecified: Secondary | ICD-10-CM | POA: Diagnosis present

## 2020-02-23 DIAGNOSIS — N859 Noninflammatory disorder of uterus, unspecified: Secondary | ICD-10-CM | POA: Diagnosis present

## 2020-02-23 DIAGNOSIS — R197 Diarrhea, unspecified: Secondary | ICD-10-CM

## 2020-02-23 LAB — DIFFERENTIAL
Abs Immature Granulocytes: 0.08 10*3/uL — ABNORMAL HIGH (ref 0.00–0.07)
Basophils Absolute: 0 10*3/uL (ref 0.0–0.1)
Basophils Relative: 0 %
Eosinophils Absolute: 0 10*3/uL (ref 0.0–0.5)
Eosinophils Relative: 0 %
Immature Granulocytes: 1 %
Lymphocytes Relative: 5 %
Lymphs Abs: 0.5 10*3/uL — ABNORMAL LOW (ref 0.7–4.0)
Monocytes Absolute: 0.5 10*3/uL (ref 0.1–1.0)
Monocytes Relative: 5 %
Neutro Abs: 10 10*3/uL — ABNORMAL HIGH (ref 1.7–7.7)
Neutrophils Relative %: 89 %

## 2020-02-23 LAB — URINALYSIS, ROUTINE W REFLEX MICROSCOPIC
Bilirubin Urine: NEGATIVE
Glucose, UA: NEGATIVE mg/dL
Hgb urine dipstick: NEGATIVE
Ketones, ur: NEGATIVE mg/dL
Leukocytes,Ua: NEGATIVE
Nitrite: NEGATIVE
Protein, ur: NEGATIVE mg/dL
Specific Gravity, Urine: 1.029 (ref 1.005–1.030)
pH: 6 (ref 5.0–8.0)

## 2020-02-23 LAB — PROTIME-INR
INR: 1.4 — ABNORMAL HIGH (ref 0.8–1.2)
Prothrombin Time: 16.7 seconds — ABNORMAL HIGH (ref 11.4–15.2)

## 2020-02-23 LAB — COMPREHENSIVE METABOLIC PANEL
ALT: 20 U/L (ref 0–44)
AST: 36 U/L (ref 15–41)
Albumin: 3.4 g/dL — ABNORMAL LOW (ref 3.5–5.0)
Alkaline Phosphatase: 87 U/L (ref 38–126)
Anion gap: 12 (ref 5–15)
BUN: 12 mg/dL (ref 8–23)
CO2: 20 mmol/L — ABNORMAL LOW (ref 22–32)
Calcium: 9.7 mg/dL (ref 8.9–10.3)
Chloride: 111 mmol/L (ref 98–111)
Creatinine, Ser: 0.8 mg/dL (ref 0.44–1.00)
GFR calc Af Amer: >60 mL/min (ref >60)
GFR calc non Af Amer: >60 mL/min (ref >60)
Glucose, Bld: 116 mg/dL — ABNORMAL HIGH (ref 70–99)
Potassium: 3.8 mmol/L (ref 3.5–5.1)
Sodium: 143 mmol/L (ref 135–145)
Total Bilirubin: 0.8 mg/dL (ref 0.3–1.2)
Total Protein: 6.4 g/dL — ABNORMAL LOW (ref 6.5–8.1)

## 2020-02-23 LAB — CBC
HCT: 39.9 % (ref 36.0–46.0)
Hemoglobin: 12.4 g/dL (ref 12.0–15.0)
MCH: 26.8 pg (ref 26.0–34.0)
MCHC: 31.1 g/dL (ref 30.0–36.0)
MCV: 86.2 fL (ref 80.0–100.0)
Platelets: 322 10*3/uL (ref 150–400)
RBC: 4.63 MIL/uL (ref 3.87–5.11)
RDW: 14.6 % (ref 11.5–15.5)
WBC: 11.5 10*3/uL — ABNORMAL HIGH (ref 4.0–10.5)
nRBC: 0 % (ref 0.0–0.2)

## 2020-02-23 LAB — TROPONIN I (HIGH SENSITIVITY)
Troponin I (High Sensitivity): 3 ng/L (ref <18)
Troponin I (High Sensitivity): 3 ng/L (ref <18)

## 2020-02-23 LAB — SARS CORONAVIRUS 2 BY RT PCR (HOSPITAL ORDER, PERFORMED IN ~~LOC~~ HOSPITAL LAB): SARS Coronavirus 2: NEGATIVE

## 2020-02-23 LAB — LACTIC ACID, PLASMA
Lactic Acid, Venous: 2.3 mmol/L (ref 0.5–1.9)
Lactic Acid, Venous: 0.7 mmol/L (ref 0.5–1.9)

## 2020-02-23 LAB — LIPASE, BLOOD: Lipase: 55 U/L — ABNORMAL HIGH (ref 11–51)

## 2020-02-23 MED ORDER — ACETAMINOPHEN 325 MG PO TABS: 650.0000 mg | ORAL_TABLET | Freq: Four times a day (QID) | ORAL | Status: DC | PRN

## 2020-02-23 MED ORDER — BACITRACIN-NEOMYCIN-POLYMYXIN 400-5-5000 EX OINT
1.0000 "application " | TOPICAL_OINTMENT | Freq: Two times a day (BID) | CUTANEOUS | Status: DC
  Administered 2020-02-23 – 2020-02-24 (×3): 1 via TOPICAL
  Filled 2020-02-23 (×3): qty 1

## 2020-02-23 MED ORDER — DICLOFENAC SODIUM 1 % TD GEL
2.0000 g | Freq: Three times a day (TID) | TRANSDERMAL | Status: DC | PRN
  Filled 2020-02-23: qty 100

## 2020-02-23 MED ORDER — POTASSIUM CHLORIDE IN NACL 20-0.9 MEQ/L-% IV SOLN: INTRAVENOUS | Status: DC

## 2020-02-23 MED ORDER — ONDANSETRON HCL 4 MG PO TABS: 4.0000 mg | ORAL_TABLET | Freq: Four times a day (QID) | ORAL | Status: DC | PRN

## 2020-02-23 MED ORDER — IOHEXOL 300 MG/ML  SOLN
100.0000 mL | Freq: Once | INTRAMUSCULAR | Status: AC | PRN
  Administered 2020-02-23: 100 mL via INTRAVENOUS

## 2020-02-23 MED ORDER — ONDANSETRON HCL 4 MG/2ML IJ SOLN
4.0000 mg | Freq: Once | INTRAMUSCULAR | Status: AC
  Administered 2020-02-23: 4 mg via INTRAVENOUS
  Filled 2020-02-23: qty 2

## 2020-02-23 MED ORDER — SODIUM CHLORIDE 0.9 % IV BOLUS
1000.0000 mL | Freq: Once | INTRAVENOUS | Status: AC
  Administered 2020-02-23: 1000 mL via INTRAVENOUS

## 2020-02-23 MED ORDER — ONDANSETRON HCL 4 MG/2ML IJ SOLN: 4.0000 mg | Freq: Four times a day (QID) | INTRAMUSCULAR | Status: DC | PRN

## 2020-02-23 MED ORDER — ACETAMINOPHEN 650 MG RE SUPP: 650.0000 mg | Freq: Four times a day (QID) | RECTAL | Status: DC | PRN

## 2020-02-23 MED ORDER — PANTOPRAZOLE SODIUM 40 MG PO TBEC
40.0000 mg | DELAYED_RELEASE_TABLET | Freq: Every day | ORAL | Status: DC
  Administered 2020-02-23 – 2020-02-24 (×2): 40 mg via ORAL
  Filled 2020-02-23 (×2): qty 1

## 2020-02-23 MED ORDER — HEPARIN SODIUM (PORCINE) 5000 UNIT/ML IJ SOLN
5000.0000 [IU] | Freq: Three times a day (TID) | INTRAMUSCULAR | Status: DC
  Administered 2020-02-23: 5000 [IU] via SUBCUTANEOUS
  Filled 2020-02-23: qty 1

## 2020-02-23 MED ORDER — APIXABAN 5 MG PO TABS
5.0000 mg | ORAL_TABLET | Freq: Two times a day (BID) | ORAL | Status: DC
  Administered 2020-02-23 – 2020-02-24 (×2): 5 mg via ORAL
  Filled 2020-02-23 (×2): qty 1

## 2020-02-23 NOTE — H&P (Addendum)
History and Physical    Barrie Sigmund LNL:[id number] DOB: 1951-04-11 DOA: 02/22/2020  PCP: Bennie Pierini, FNP  Patient coming from: Home  I have personally briefly reviewed patient's old medical records in [facility]  Chief Complaint: Vomiting and diarrhea  HPI: Christina [name] is a 69 y.o. female with medical history significant of GERD, previous DVT/PE/clotting disorder on anticoagulation, who had epigastric hernia repair on 01/27/2020 with Dr. Henreitta Leber.  Patient reports that postoperatively she has been doing well.  Until yesterday, she was in her usual state of health.  She reports eating chicken tenders from [employer].  Later in the day, she went to a birthday party where she had cake.  Upon returning home, she had increasing nausea and vomiting.  She basically threw up whatever she had eaten earlier in the day.  She also began to throw up bile.  This is followed by several loose stools.  She did not have any fever, cough, shortness of breath.  Last bowel movement was yesterday.  She was feeling increasingly weak.  No one else in her family had similar symptoms.  ED Course: On arrival, blood pressure was stable but she was tachycardic at 125.  Lactic acid elevated at 2.3.  CT abdomen with the following findings:Postsurgical changes along the anterior abdominal wall with a small oblong thick-walled rim enhancing fluid collection measuring 4.3 x 1.0 x 3.7 cm in size with some mild surrounding inflammatory change as well as additional thickening and phlegmon external to the rectus sheath though appearing contiguous with this collection. Could reflect some postoperative hematoma or seroma though superinfection/abscess is suspected given patient's presenting symptoms and surrounding inflammation.  She has been referred for admission.  Review of Systems: As per HPI otherwise 10 point review of systems negative.    Past Medical History:  Diagnosis Date  . Allergic  rhinitis   . Clotting disorder (HCC)   . DVT (deep venous thrombosis) (HCC) 1990's  . DVT (deep venous thrombosis) (HCC) 1980s   in the leg behind my knee;  right   . Epicondylitis    right   . GERD (gastroesophageal reflux disease)   . Hyperlipidemia   . Hypertension   . Osteoarthritis   . Pulmonary embolism (HCC) 1990's    Past Surgical History:  Procedure Laterality Date  . COLONOSCOPY  2015  . CYSTECTOMY     From shoulder and neck  . KNEE ARTHROSCOPY Left    Dr Ranell Patrick  . SKIN GRAFT     ulcer on right leg   . TOTAL KNEE ARTHROPLASTY Right 08/23/2019   Procedure: TOTAL KNEE ARTHROPLASTY;  Surgeon: Beverely Low, MD;  Location: [facility];  Service: Orthopedics;  Laterality: Right;  . TUBAL LIGATION     Multiple  . UMBILICAL HERNIA REPAIR N/A 01/27/2020   Procedure: EPIGASTRIC HERNIA REPAIR WITH MESH;  Surgeon: Lucretia Roers, MD;  Location: [facility];  Service: General;  Laterality: N/A;  epicastric site    Social History:  reports that she quit smoking about 40 years ago. She has never used smokeless tobacco. She reports that she does not drink alcohol and does not use drugs.  Allergies  Allergen Reactions  . Ace Inhibitors Cough         Family History  Problem Relation Age of Onset  . Heart attack Mother   . Deep vein thrombosis Mother   . Diabetes Mother   . Heart disease Mother   . Hyperlipidemia Mother   . Cancer Father   .  Hyperlipidemia Sister   . Deep vein thrombosis Daughter   . Other Neg Hx        Clotting disorder    Prior to Admission medications   Medication Sig Start Date End Date Taking? Authorizing Provider  acetaminophen (TYLENOL) 325 MG tablet Take 2 tablets (650 mg total) by mouth every 6 (six) hours as needed for mild pain (or Fever >/= 101). 01/28/20   Johnson, Clanford L, MD  apixaban (ELIQUIS) 5 MG TABS tablet Take 1 tablet by mouth 2 (two) times daily. 01/22/20   [provider]  diclofenac sodium (VOLTAREN) 1 % GEL Apply 2 g  topically 3 (three) times daily as needed (knee pain). 01/28/20   Johnson, Clanford L, MD  pantoprazole (PROTONIX) 40 MG tablet TAKE 1 TABLET BY MOUTH EVERY DAY Patient taking differently: Take 40 mg by mouth as needed.  01/20/20   Daphine Deutscher Mary-Margaret, FNP  Prenatal Vit-Fe Fumarate-FA (PRENATAL VITAMINS) 28-0.8 MG TABS Take 1 tablet by mouth daily. 01/28/20   Cleora Fleet, MD    Physical Exam: Vitals:   02/23/20 0230 02/23/20 0330 02/23/20 0430 02/23/20 0641  BP: 130/80  124/70 122/68  Pulse: 100 99 92 85  Resp: 17  18 19   Temp:    98 F (36.7 C)  TempSrc:    Oral  SpO2: 100%  98% 100%  Weight:    67.4 kg  Height:    5\' 9"  (1.753 m)    Constitutional: NAD, calm, comfortable Eyes: PERRL, lids and conjunctivae normal ENMT: Mucous membranes are moist. Posterior pharynx clear of any exudate or lesions.Normal dentition.  Neck: normal, supple, no masses, no thyromegaly Respiratory: clear to auscultation bilaterally, no wheezing, no crackles. Normal respiratory effort. No accessory muscle use.  Cardiovascular: Regular rate and rhythm, no murmurs / rubs / gallops. No extremity edema. 2+ pedal pulses. No carotid bruits.  Abdomen: no tenderness, no masses palpated. No hepatosplenomegaly. Bowel sounds positive.  Musculoskeletal: no clubbing / cyanosis. No joint deformity upper and lower extremities. Good ROM, no contractures. Normal muscle tone.  Skin: no rashes, lesions, ulcers. No induration Neurologic: CN 2-12 grossly intact. Sensation intact, DTR normal. Strength 5/5 in all 4.  Psychiatric: Normal judgment and insight. Alert and oriented x 3. Normal mood.    Labs on Admission: I have personally reviewed following labs and imaging studies  CBC: Recent Labs  Lab 02/23/20 0046  WBC 11.5*  HGB 12.4  HCT 39.9  MCV 86.2  PLT 322   Basic Metabolic Panel: Recent Labs  Lab 02/23/20 0046  NA 143  K 3.8  CL 111  CO2 20*  GLUCOSE 116*  BUN 12  CREATININE 0.80  CALCIUM 9.7    GFR: Estimated Creatinine Clearance: 67.4 mL/min (by C-G formula based on SCr of 0.8 mg/dL). Liver Function Tests: Recent Labs  Lab 02/23/20 0046  AST 36  ALT 20  ALKPHOS 87  BILITOT 0.8  PROT 6.4*  ALBUMIN 3.4*   Recent Labs  Lab 02/23/20 0046  LIPASE 55*   No results for input(s): AMMONIA in the last 168 hours. Coagulation Profile: Recent Labs  Lab 02/23/20 0046  INR 1.4*   Cardiac Enzymes: No results for input(s): CKTOTAL, CKMB, CKMBINDEX, TROPONINI in the last 168 hours. BNP (last 3 results) No results for input(s): PROBNP in the last 8760 hours. HbA1C: No results for input(s): HGBA1C in the last 72 hours. CBG: No results for input(s): GLUCAP in the last 168 hours. Lipid Profile: No results for input(s): CHOL, HDL,  LDLCALC, TRIG, CHOLHDL, LDLDIRECT in the last 72 hours. Thyroid Function Tests: No results for input(s): TSH, T4TOTAL, FREET4, T3FREE, THYROIDAB in the last 72 hours. Anemia Panel: No results for input(s): VITAMINB12, FOLATE, FERRITIN, TIBC, IRON, RETICCTPCT in the last 72 hours. Urine analysis:    Component Value Date/Time   COLORURINE YELLOW 02/22/2020 2341   APPEARANCEUR CLEAR 02/22/2020 2341   APPEARANCEUR Clear 11/07/2019 1557   LABSPEC 1.029 02/22/2020 2341   PHURINE 6.0 02/22/2020 2341   GLUCOSEU NEGATIVE 02/22/2020 2341   HGBUR NEGATIVE 02/22/2020 2341   BILIRUBINUR NEGATIVE 02/22/2020 2341   BILIRUBINUR Negative 11/07/2019 1557   KETONESUR NEGATIVE 02/22/2020 2341   PROTEINUR NEGATIVE 02/22/2020 2341   NITRITE NEGATIVE 02/22/2020 2341   LEUKOCYTESUR NEGATIVE 02/22/2020 2341    Radiological Exams on Admission: CT ABDOMEN PELVIS W CONTRAST  Result Date: 02/23/2020 CLINICAL DATA:  Abdominal pain, fever, umbilical hernia repair 3-4 weeks prior EXAM: CT ABDOMEN AND PELVIS WITH CONTRAST TECHNIQUE: Multidetector CT imaging of the abdomen and pelvis was performed using the standard protocol following bolus administration of intravenous  contrast. CONTRAST:  OMNIPAQUE IOHEXOL 300 MG/ML  SOLN COMPARISON:  CT 01/17/2020, ultrasound 01/01/2020 FINDINGS: Lower chest: Bandlike areas of subsegmental atelectasis or scarring in the otherwise clear lung bases. Normal heart size. No pericardial effusion. Coronary artery atherosclerosis is present. Hepatobiliary: No worrisome focal liver lesions. Smooth liver surface contour. Normal hepatic attenuation. Mildly distended gallbladder without wall thickening or visible calcified gallstones. No biliary ductal dilatation. Pancreas: Unremarkable. No pancreatic ductal dilatation or surrounding inflammatory changes. Spleen: Normal in size without focal abnormality. Adrenals/Urinary Tract: Normal adrenal glands. Redemonstration of a thick-walled cystic focus in the upper pole right kidney measuring 5.3 x 2.4 cm, grossly unchanged from multiple comparison. No other focal concerning renal lesion. 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 Single left ureter. No obstructive urolithiasis or frank hydronephrosis. Circumferential urinary bladder wall thickening and perivesicular hazy stranding. Stomach/Bowel: Sliding-type hiatal hernia. Mild gastric wall thickening possibly related to underdistention. Duodenum takes a normal course across the midline abdomen. Some mild thickening of the small bowel is in the vicinity of the postsurgical change. A normal appendix is visualized. The colon demonstrates some mild diffuse pancolonic thickening though possibly accentuated by underdistention. Much of the proximal colon is fluid-filled. Minimal formed stool in the distal colon and rectum. Vascular/Lymphatic: Atherosclerotic calcifications within the abdominal aorta and branch vessels. No aneurysm or ectasia. No enlarged abdominopelvic lymph nodes. Reproductive: Anteverted uterus. Some atypical nodular thickening of the endometrium is noted (6/60) seen on comparison ultrasound in suspicious for a small  endometrial polyp. No concerning adnexal lesions. Other: Postsurgical changes along the anterior abdominal wall with phlegmonous change at the site of prior umbilical hernia and a small oblong thick-walled rim enhancing fluid collection measuring 4.3 by 1.0 by 3.7 cm in size with some mild surrounding inflammatory change as well as additional thickening and phlegmon external to the rectus sheath though possibly contiguous with this collection (2/33). No other abdominopelvic collections. No free air or fluid in the abdomen or pelvis. No new no bowel containing hernias. Musculoskeletal: Multilevel degenerative changes are present in the imaged portions of the spine. No acute osseous abnormality or suspicious osseous lesion. IMPRESSION: 1. Postsurgical changes along the anterior abdominal wall with a small oblong thick-walled rim enhancing fluid collection measuring 4.3 x 1.0 x 3.7 cm in size with some mild surrounding inflammatory change as well as additional thickening and phlegmon external to the rectus sheath though  appearing contiguous with this collection. Could reflect some postoperative hematoma or seroma though superinfection/abscess is suspected given patient's presenting symptoms and surrounding inflammation. 2. Circumferential urinary bladder wall thickening and perivesicular hazy stranding, correlate with urinalysis to exclude cystitis. 3. Some reactive thickening of the small bowel adjacent the collection with multitude of fluid-filled loops as well as a mildly edematous and thickened colon with lack of formed stool. Findings could reflect infectious or inflammatory enterocolitis with rapid transit state. 4. Sliding-type hiatal hernia. 5. Some atypical nodular thickening of the endometrium again seen an previously characterized as possible polyp on prior pelvic ultrasound. Recommend outpatient consultation for hysteroscopy if not previously obtained. 6. Aortic Atherosclerosis (ICD10-I70.0). Electronically  Signed   By: Kreg ShropshirePrice  DeHay M.D.   On: 02/23/2020 03:42    EKG: Independently reviewed.  Sinus tachycardia  Assessment/Plan Active Problems:   History of DVT of lower extremity   On anticoagulant therapy   Nausea vomiting and diarrhea   Postoperative pain   Gastroenteritis   Disorder of endometrium     1. Nausea and vomiting and diarrhea, likely related to gastroenteritis.  Overall symptoms appear to be improving.  Last bowel movement was yesterday.  Will start on clear liquids and advance to soft diet as tolerated. 2. Abnormal CT.  Patient had CT abdomen done that showed fluid collection near rectus sheath.  Unclear if this is hematoma versus seroma versus abscess.  Since patient does not appear to be toxic/septic, suspect this is either a seroma or hematoma.  General surgery consulted and Dr. Henreitta LeberBridges will evaluate later today. 3. Lactic acidosis.  Related to vomiting/diarrhea.  Improved with IV fluids. 4. 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  Will hold anticoagulation for now in case surgical intervention is indicated.  This can be resumed on discharge. 5. Nodular endometrium.  Noted on CT imaging.  Will need follow-up with gynecology for hysteroscopy.  DVT prophylaxis: heparin  Code Status: full code  Family Communication: discussed with patient  Disposition Plan: discharge home once tolerating po intake and no further work up planned by surgery  Consults called: general surgery  Admission status: observation, medsurg   Erick BlinksJehanzeb Errol Ala MD [facility]   If 7PM-7AM, please contact night-coverage [email or web address]   02/23/2020, 9:57 AM

## 2020-02-23 NOTE — ED Notes (Signed)
Date and time results received: 02/23/20 0136  Test: Lactic Acid  Critical Value: 2.3  Name of Provider Notified: Rancour  Orders Received? Or Actions Taken?: na

## 2020-02-23 NOTE — Progress Notes (Addendum)
[facility]  Patient's CT reviewed and epigastric hernia repair with mesh. Normal amount of inflammation from the mesh and hematoma/ seroma but I do not think this is an abscess.  Mild leukocytosis but also looks like she may have a gastroenteritis going on with thickening. She reported diarrhea and vomiting with the abdominal pain so I think this is most likely.  Incision looks the same as prior from last week. Superior portion of incision with some skin erosion where the epidermis came off with the dermabond. No erythema or drainage. Minimally tender.  Again no post operative infection noted and the collection is a seroma versus hematoma and will resolve.  Diet as tolerated. Wants to eat.  Follow up PRN. Neosporin to the top of incision twice daily until scabbed over. Told patient to call if she notices any spreading redness or concern for infection, but again she is 4 weeks out so this would be unlikely.   Algis Greenhouse, MD [facility] [address] [place], [place] [address] [phone] (office)

## 2020-02-23 NOTE — ED Provider Notes (Signed)
[facility] Provider Note   CSN: [id number] Arrival date & time: 02/22/20  2333     History Chief Complaint  Patient presents with  . Emesis    Christina [name] is a 69 y.o. female.  Patient with history of DVT on Eliquis, recent admission for nausea and vomiting with incarcerated epigastric hernia repair here with sudden onset nausea and vomiting tonight with diarrhea.  States everything was fine until 9 or 10 PM.  She saw Dr. Henreitta LeberBridges for surgeon 3 days ago and told everything was healing appropriately from her surgery.  She had 4 episodes of nonbloody nonbilious emesis as well as 4 episodes of nonbloody diarrhea as well.  Describes diffuse crampy abdominal pain that has been constant as well.  No fever.  No chest pain or shortness of breath.  No recent travel or sick contacts.  States she has been doing well since her surgery until this evening.  EMS gave her Zofran and her nausea has improved.  No chest pain or shortness of breath.  The history is provided by the patient and the EMS personnel.  Emesis Associated symptoms: abdominal pain and diarrhea   Associated symptoms: no arthralgias, no cough, no fever, no headaches and no myalgias        Past Medical History:  Diagnosis Date  . Allergic rhinitis   . Clotting disorder (HCC)   . DVT (deep venous thrombosis) (HCC) 1990's  . DVT (deep venous thrombosis) (HCC) 1980s   in the leg behind my knee;  right   . Epicondylitis    right   . GERD (gastroesophageal reflux disease)   . Hyperlipidemia   . Hypertension   . Osteoarthritis   . Pulmonary embolism [facility](HCC) 1990's    Patient Active Problem List   Diagnosis Date Noted  . Abnormal weight loss 01/25/2020  . Incarcerated epigastric hernia   . Nausea vomiting and diarrhea   . Weight loss, unintentional 01/23/2020  . Unexplained weight loss 01/23/2020  . Abnormal LFTs 09/03/2019  . Status post total knee replacement, left 08/23/2019  . Status post  total knee replacement, right 08/23/2019  . Acquired cyst of kidney 06/03/2019  . Osteoarthritis of left knee 06/12/2018  . Osteoarthritis of right knee 06/12/2018  . Acute idiopathic gout involving toe of left foot 04/13/2018  . Peripheral edema 04/13/2018  . Pain in left knee 10/11/2017  . On anticoagulant therapy 03/05/2014  . History of DVT of lower extremity 11/01/2012  . Hyperlipidemia with target LDL less than 100 05/12/2010  . BMI 35.0-35.9,adult 05/12/2010  . Essential hypertension, benign 05/12/2010  . ABNORMAL STRESS ELECTROCARDIOGRAM 05/12/2010    Past Surgical History:  Procedure Laterality Date  . COLONOSCOPY  2015  . CYSTECTOMY     From shoulder and neck  . KNEE ARTHROSCOPY Left    Dr Ranell PatrickNorris  . SKIN GRAFT     ulcer on right leg   . TOTAL KNEE ARTHROPLASTY Right 08/23/2019   Procedure: TOTAL KNEE ARTHROPLASTY;  Surgeon: Beverely LowNorris, Steve, MD;  Location: [facility] ORS;  Service: Orthopedics;  Laterality: Right;  . TUBAL LIGATION     Multiple  . UMBILICAL HERNIA REPAIR N/A 01/27/2020   Procedure: EPIGASTRIC HERNIA REPAIR WITH MESH;  Surgeon: Lucretia RoersBridges, Lindsay C, MD;  Location: [facility];  Service: General;  Laterality: N/A;  epicastric site     OB History   No obstetric history on file.     Family History  Problem Relation Age of Onset  . Heart  attack Mother   . Deep vein thrombosis Mother   . Diabetes Mother   . Heart disease Mother   . Hyperlipidemia Mother   . Cancer Father   . Hyperlipidemia Sister   . Deep vein thrombosis Daughter   . Other Neg Hx        Clotting disorder    Social History   Tobacco Use  . Smoking status: Former Smoker    Quit date: 08/16/1979    Years since quitting: 40.5  . Smokeless tobacco: Never Used  . Tobacco comment: x33 years  Vaping Use  . Vaping Use: Never used  Substance Use Topics  . Alcohol use: No    Alcohol/week: 0.0 standard drinks  . Drug use: No    Home Medications Prior to Admission medications   Medication Sig  Start Date End Date Taking? Authorizing Provider  acetaminophen (TYLENOL) 325 MG tablet Take 2 tablets (650 mg total) by mouth every 6 (six) hours as needed for mild pain (or Fever >/= 101). 01/28/20   Johnson, Clanford L, MD  apixaban (ELIQUIS) 5 MG TABS tablet Take 1 tablet by mouth 2 (two) times daily. 01/22/20   [provider]  diclofenac sodium (VOLTAREN) 1 % GEL Apply 2 g topically 3 (three) times daily as needed (knee pain). 01/28/20   Johnson, Clanford L, MD  pantoprazole (PROTONIX) 40 MG tablet TAKE 1 TABLET BY MOUTH EVERY DAY Patient taking differently: Take 40 mg by mouth as needed.  01/20/20   Daphine Deutscher Mary-Margaret, FNP  Prenatal Vit-Fe Fumarate-FA (PRENATAL VITAMINS) 28-0.8 MG TABS Take 1 tablet by mouth daily. 01/28/20   Cleora Fleet, MD    Allergies    Ace inhibitors  Review of Systems   Review of Systems  Constitutional: Positive for activity change and appetite change. Negative for fever.  HENT: Negative for congestion.   Respiratory: Negative for cough, chest tightness and shortness of breath.   Gastrointestinal: Positive for abdominal pain, diarrhea, nausea and vomiting.  Genitourinary: Negative for dysuria and hematuria.  Musculoskeletal: Negative for arthralgias and myalgias.  Skin: Negative for rash.  Neurological: Negative for dizziness, weakness and headaches.   all other systems are negative except as noted in the HPI and PMH.   Physical Exam Updated Vital Signs BP 120/74   Pulse (!) 116   Temp 97.7 F (36.5 C) (Oral)   Resp (!) 22   Ht 5\' 9"  (1.753 m)   Wt 67 kg   LMP  (LMP Unknown)   SpO2 100%   BMI 21.81 kg/m   Physical Exam Vitals and nursing note reviewed.  Constitutional:      General: She is not in acute distress.    Appearance: She is well-developed.  HENT:     Head: Normocephalic and atraumatic.     Mouth/Throat:     Pharynx: No oropharyngeal exudate.  Eyes:     Conjunctiva/sclera: Conjunctivae normal.     Pupils: Pupils are  equal, round, and reactive to light.  Neck:     Comments: No meningismus. Cardiovascular:     Rate and Rhythm: Regular rhythm. Tachycardia present.     Heart sounds: Normal heart sounds. No murmur heard.   Pulmonary:     Effort: Pulmonary effort is normal. No respiratory distress.     Breath sounds: Normal breath sounds.  Abdominal:     Palpations: Abdomen is soft.     Tenderness: There is abdominal tenderness. There is no guarding or rebound.     Comments: Healing  epigastric incision.  Mild diffuse tenderness, no guarding or rebound  Musculoskeletal:        General: No tenderness. Normal range of motion.     Cervical back: Normal range of motion and neck supple.  Skin:    General: Skin is warm.     Capillary Refill: Capillary refill takes less than 2 seconds.  Neurological:     General: No focal deficit present.     Mental Status: She is alert and oriented to person, place, and time. Mental status is at baseline.     Cranial Nerves: No cranial nerve deficit.     Motor: No abnormal muscle tone.     Coordination: Coordination normal.     Comments:  5/5 strength throughout. CN 2-12 intact.Equal grip strength.   Psychiatric:        Behavior: Behavior normal.     ED Results / Procedures / Treatments   Labs (all labs ordered are listed, but only abnormal results are displayed) Labs Reviewed  LIPASE, BLOOD - Abnormal; Notable for the following components:      Result Value   Lipase 55 (*)    All other components within normal limits  COMPREHENSIVE METABOLIC PANEL - Abnormal; Notable for the following components:   CO2 20 (*)    Glucose, Bld 116 (*)    Total Protein 6.4 (*)    Albumin 3.4 (*)    All other components within normal limits  CBC - Abnormal; Notable for the following components:   WBC 11.5 (*)    All other components within normal limits  LACTIC ACID, PLASMA - Abnormal; Notable for the following components:   Lactic Acid, Venous 2.3 (*)    All other components  within normal limits  PROTIME-INR - Abnormal; Notable for the following components:   Prothrombin Time 16.7 (*)    INR 1.4 (*)    All other components within normal limits  SARS CORONAVIRUS 2 BY RT PCR (HOSPITAL ORDER, PERFORMED IN [facility])  URINALYSIS, ROUTINE W REFLEX MICROSCOPIC  LACTIC ACID, PLASMA  TROPONIN I (HIGH SENSITIVITY)  TROPONIN I (HIGH SENSITIVITY)    EKG EKG Interpretation  Date/Time:  Sunday February 23 2020 00:13:38 EDT Ventricular Rate:  115 PR Interval:    QRS Duration: 82 QT Interval:  320 QTC Calculation: 443 R Axis:   75 Text Interpretation: Sinus tachycardia Rate faster Confirmed by Glynn Octave [phone]) on 02/23/2020 12:22:52 AM   Radiology CT ABDOMEN PELVIS W CONTRAST  Result Date: 02/23/2020 CLINICAL DATA:  Abdominal pain, fever, umbilical hernia repair 3-4 weeks prior EXAM: CT ABDOMEN AND PELVIS WITH CONTRAST TECHNIQUE: Multidetector CT imaging of the abdomen and pelvis was performed using the standard protocol following bolus administration of intravenous contrast. CONTRAST:  OMNIPAQUE IOHEXOL 300 MG/ML  SOLN COMPARISON:  CT 01/17/2020, ultrasound 01/01/2020 FINDINGS: Lower chest: Bandlike areas of subsegmental atelectasis or scarring in the otherwise clear lung bases. Normal heart size. No pericardial effusion. Coronary artery atherosclerosis is present. Hepatobiliary: No worrisome focal liver lesions. Smooth liver surface contour. Normal hepatic attenuation. Mildly distended gallbladder without wall thickening or visible calcified gallstones. No biliary ductal dilatation. Pancreas: Unremarkable. No pancreatic ductal dilatation or surrounding inflammatory changes. Spleen: Normal in size without focal abnormality. Adrenals/Urinary Tract: Normal adrenal glands. Redemonstration of a thick-walled cystic focus in the upper pole right kidney measuring 5.3 x 2.4 cm, grossly unchanged from multiple comparison. No other focal concerning renal  lesion. 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 Single left ureter. No obstructive urolithiasis or frank hydronephrosis. Circumferential urinary bladder wall thickening and perivesicular hazy stranding. Stomach/Bowel: Sliding-type hiatal hernia. Mild gastric wall thickening possibly related to underdistention. Duodenum takes a normal course across the midline abdomen. Some mild thickening of the small bowel is in the vicinity of the postsurgical change. A normal appendix is visualized. The colon demonstrates some mild diffuse pancolonic thickening though possibly accentuated by underdistention. Much of the proximal colon is fluid-filled. Minimal formed stool in the distal colon and rectum. Vascular/Lymphatic: Atherosclerotic calcifications within the abdominal aorta and branch vessels. No aneurysm or ectasia. No enlarged abdominopelvic lymph nodes. Reproductive: Anteverted uterus. Some atypical nodular thickening of the endometrium is noted (6/60) seen on comparison ultrasound in suspicious for a small endometrial polyp. No concerning adnexal lesions. Other: Postsurgical changes along the anterior abdominal wall with phlegmonous change at the site of prior umbilical hernia and a small oblong thick-walled rim enhancing fluid collection measuring 4.3 by 1.0 by 3.7 cm in size with some mild surrounding inflammatory change as well as additional thickening and phlegmon external to the rectus sheath though possibly contiguous with this collection (2/33). No other abdominopelvic collections. No free air or fluid in the abdomen or pelvis. No new no bowel containing hernias. Musculoskeletal: Multilevel degenerative changes are present in the imaged portions of the spine. No acute osseous abnormality or suspicious osseous lesion. IMPRESSION: 1. Postsurgical changes along the anterior abdominal wall with a small oblong thick-walled rim enhancing fluid collection measuring 4.3 x 1.0 x 3.7 cm in size  with some mild surrounding inflammatory change as well as additional thickening and phlegmon external to the rectus sheath though appearing contiguous with this collection. Could reflect some postoperative hematoma or seroma though superinfection/abscess is suspected given patient's presenting symptoms and surrounding inflammation. 2. Circumferential urinary bladder wall thickening and perivesicular hazy stranding, correlate with urinalysis to exclude cystitis. 3. Some reactive thickening of the small bowel adjacent the collection with multitude of fluid-filled loops as well as a mildly edematous and thickened colon with lack of formed stool. Findings could reflect infectious or inflammatory enterocolitis with rapid transit state. 4. Sliding-type hiatal hernia. 5. Some atypical nodular thickening of the endometrium again seen an previously characterized as possible polyp on prior pelvic ultrasound. Recommend outpatient consultation for hysteroscopy if not previously obtained. 6. Aortic Atherosclerosis (ICD10-I70.0). Electronically Signed   By: Kreg Shropshire M.D.   On: 02/23/2020 03:42    Procedures Procedures (including critical care time)  Medications Ordered in ED Medications  sodium chloride 0.9 % bolus 1,000 mL (has no administration in time range)  ondansetron (ZOFRAN) injection 4 mg (has no administration in time range)    ED Course  I have reviewed the triage vital signs and the nursing notes.  Pertinent labs & imaging results that were available during my care of the patient were reviewed by me and considered in my medical decision making (see chart for details).    MDM Rules/Calculators/A&P                         Nausea, vomiting and diarrhea onset this evening after eating some fried chicken.  Recent epigastric hernia repair about 1 month ago.  Tachycardic to the 120s.  Stable blood pressure.  No fever.  Mild abdominal pain. Patient hydrated.  Labs obtained.  Mild lactic  acidosis.  Patient feeling improved.  Heart rate improved to the 90s.  Labs otherwise reassuring. CT scan shows large fluid  collection with inflammatory change and possible phlegmon next to rectus sheath.  Concern for possible hematoma versus seroma versus abscess.  Patient also with multiple distended bowel loops.  Discussed with Dr. Henreitta Leber of general surgery.  She feels this most likely represents hematoma.  Patient appears well nontoxic.  There is no signs of infection on her skin exam.  Patient most likely has viral gastroenteritis causing her nausea, vomiting and diarrhea as well as bowel thickening loops.  Dr. Henreitta Leber is requesting hospitalist admission for observation and hydration.  Request to hold antibiotics at this time.  She will evaluate the wound later today.  Discussed with Dr. Robb Matar. Final Clinical Impression(s) / ED Diagnoses Final diagnoses:  None    Rx / DC Orders ED Discharge Orders    None       Kay Ricciuti, Jeannett Senior, MD 02/23/20 [phone]

## 2020-02-23 NOTE — ED Notes (Signed)
2 RN's attempted 2 IV sticks without success.

## 2020-02-24 DIAGNOSIS — R112 Nausea with vomiting, unspecified: Secondary | ICD-10-CM | POA: Diagnosis not present

## 2020-02-24 LAB — CBC
HCT: 32.5 % — ABNORMAL LOW (ref 36.0–46.0)
Hemoglobin: 10 g/dL — ABNORMAL LOW (ref 12.0–15.0)
MCH: 26.9 pg (ref 26.0–34.0)
MCHC: 30.8 g/dL (ref 30.0–36.0)
MCV: 87.4 fL (ref 80.0–100.0)
Platelets: 250 10*3/uL (ref 150–400)
RBC: 3.72 MIL/uL — ABNORMAL LOW (ref 3.87–5.11)
RDW: 14.5 % (ref 11.5–15.5)
WBC: 4 10*3/uL (ref 4.0–10.5)
nRBC: 0 % (ref 0.0–0.2)

## 2020-02-24 LAB — BASIC METABOLIC PANEL
Anion gap: 6 (ref 5–15)
BUN: 7 mg/dL — ABNORMAL LOW (ref 8–23)
CO2: 22 mmol/L (ref 22–32)
Calcium: 8.8 mg/dL — ABNORMAL LOW (ref 8.9–10.3)
Chloride: 115 mmol/L — ABNORMAL HIGH (ref 98–111)
Creatinine, Ser: 0.71 mg/dL (ref 0.44–1.00)
GFR calc Af Amer: >60 mL/min (ref >60)
GFR calc non Af Amer: >60 mL/min (ref >60)
Glucose, Bld: 84 mg/dL (ref 70–99)
Potassium: 3.9 mmol/L (ref 3.5–5.1)
Sodium: 143 mmol/L (ref 135–145)

## 2020-02-24 MED ORDER — BACITRACIN-NEOMYCIN-POLYMYXIN 400-5-5000 EX OINT: 1.0000 "application " | TOPICAL_OINTMENT | Freq: Two times a day (BID) | CUTANEOUS | 0 refills | Status: DC

## 2020-02-24 MED ORDER — ONDANSETRON HCL 4 MG PO TABS: 4.0000 mg | ORAL_TABLET | Freq: Four times a day (QID) | ORAL | 0 refills | Status: DC | PRN

## 2020-02-24 NOTE — Discharge Instructions (Signed)
1) you are taking apixaban/Eliquis for blood thinner so please Avoid ibuprofen/Advil/Aleve/Motrin/Goody Powders/Naproxen/BC powders/Meloxicam/Diclofenac/Indomethacin and other Nonsteroidal anti-inflammatory medications as these will make you more likely to bleed and can cause stomach ulcers, can also cause Kidney problems.   2) follow-up with general surgeon Dr. Henreitta Leber as previously advised  3) soft diet advised  4)Apply Neosporin ointment as advised to your postsurgical/postoperative wound

## 2020-02-24 NOTE — Discharge Summary (Signed)
Christina [name], is a 69 y.o. female  DOB [birth year]  MRN [id number].  Admission date:  02/22/2020  Admitting Physician  Bobette Moavid Manuel Ortiz, MD  Discharge Date:  02/24/2020   Primary MD  Bennie PieriniMartin, Mary-Margaret, FNP  Recommendations for primary care physician for things to follow:   1) you are taking apixaban/Eliquis for blood thinner so please Avoid ibuprofen/Advil/Aleve/Motrin/Goody Powders/Naproxen/BC powders/Meloxicam/Diclofenac/Indomethacin and other Nonsteroidal anti-inflammatory medications as these will make you more likely to bleed and can cause stomach ulcers, can also cause Kidney problems.   2) follow-up with general surgeon Dr. Henreitta LeberBridges as previously advised  3) soft diet advised  4)Apply Neosporin ointment as advised to your postsurgical/postoperative wound  Admission Diagnosis  Postoperative pain [G89.18]   Discharge Diagnosis  Postoperative pain [G89.18]    Active Problems:   History of DVT of lower extremity   On anticoagulant therapy   Nausea vomiting and diarrhea   Postoperative pain   Gastroenteritis   Disorder of endometrium   Lactic acidosis      Past Medical History:  Diagnosis Date  . Allergic rhinitis   . Clotting disorder (HCC)   . DVT (deep venous thrombosis) (HCC) 1990's  . DVT (deep venous thrombosis) (HCC) 1980s   in the leg behind my knee;  right   . Epicondylitis    right   . GERD (gastroesophageal reflux disease)   . Hyperlipidemia   . Hypertension   . Osteoarthritis   . Pulmonary embolism (HCC) 1990's    Past Surgical History:  Procedure Laterality Date  . COLONOSCOPY  2015  . CYSTECTOMY     From shoulder and neck  . KNEE ARTHROSCOPY Left    Dr Ranell PatrickNorris  . SKIN GRAFT     ulcer on right leg   . TOTAL KNEE ARTHROPLASTY Right 08/23/2019   Procedure: TOTAL KNEE ARTHROPLASTY;  Surgeon: Beverely LowNorris, Steve, MD;  Location: [facility];  Service: Orthopedics;   Laterality: Right;  . TUBAL LIGATION     Multiple  . UMBILICAL HERNIA REPAIR N/A 01/27/2020   Procedure: EPIGASTRIC HERNIA REPAIR WITH MESH;  Surgeon: Lucretia RoersBridges, Lindsay C, MD;  Location: [facility];  Service: General;  Laterality: N/A;  epicastric site     HPI  from the history and physical done on the day of admission:   Chief Complaint: Vomiting and diarrhea  HPI: Christina [name] is a 69 y.o. female with medical history significant of GERD, previous DVT/PE/clotting disorder on anticoagulation, who had epigastric hernia repair on 01/27/2020 with Dr. Henreitta LeberBridges.  Patient reports that postoperatively she has been doing well.  Until yesterday, she was in her usual state of health.  She reports eating chicken tenders from [employer].  Later in the day, she went to a birthday party where she had cake.  Upon returning home, she had increasing nausea and vomiting.  She basically threw up whatever she had eaten earlier in the day.  She also began to throw up bile.  This is followed by several loose stools.  She did not have  any fever, cough, shortness of breath.  Last bowel movement was yesterday.  She was feeling increasingly weak.  No one else in her family had similar symptoms.  ED Course: On arrival, blood pressure was stable but she was tachycardic at 125.  Lactic acid elevated at 2.3.  CT abdomen with the following findings:Postsurgical changes along the anterior abdominal wall with a small oblong thick-walled rim enhancing fluid collection measuring 4.3 x 1.0 x 3.7 cm in size with some mild surrounding inflammatory change as well as additional thickening and phlegmon external to the rectus sheath though appearing contiguous with this collection. Could reflect some postoperative hematoma or seroma though superinfection/abscess is suspected given patient's presenting symptoms and surrounding inflammation.  She has been referred for admission.     Hospital Course:         1)Nausea and vomiting  and diarrhea, likely related to gastroenteritis.   -CT abdomen pelvis reviewed with general surgeon Dr. Henreitta Leber who states the patient had epigastric hernia repair with mesh.  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    Okay to restart on discharge as general surgeon does not plan any intervention   4)Nodular endometrium---  Noted on CT imaging.  Outpatient follow-up with gynecology for hysteroscopy strongly advised patient verbalized understanding  Discharge Condition: stable  Follow UP--- general surgeon and GYN as advised   Consults obtained - gen surgeon  Diet and Activity recommendation:  As advised  Discharge Instructions    Discharge Instructions    Call MD for:  difficulty breathing, headache or visual disturbances   Complete by: As directed    Call MD for:  persistant dizziness or light-headedness   Complete by: As directed    Call MD for:  persistant nausea and vomiting   Complete by: As directed    Call MD for:  severe uncontrolled pain   Complete by: As directed    Call MD for:  temperature >100.4   Complete by: As directed    Diet - low sodium heart healthy   Complete by: As directed    Discharge instructions   Complete by: As directed    1) you are taking apixaban/Eliquis for blood thinner so please Avoid ibuprofen/Advil/Aleve/Motrin/Goody Powders/Naproxen/BC powders/Meloxicam/Diclofenac/Indomethacin and other Nonsteroidal anti-inflammatory medications as these will make you more likely to bleed and can cause stomach  ulcers, can also cause Kidney problems.   2) follow-up with general surgeon Dr. Henreitta Leber as previously advised  3) soft diet advised  4)Apply Neosporin ointment as advised to your postsurgical/postoperative wound   Discharge wound care:   Complete by: As directed    Apply Neosporin ointment as advised to your postsurgical/postoperative wound   Increase activity slowly   Complete by: As directed         Discharge Medications     Allergies as of 02/24/2020      Reactions   Ace Inhibitors Cough         Medication List    TAKE these medications   acetaminophen 325 MG tablet Commonly known as: TYLENOL Take 2 tablets (650 mg total) by mouth every 6 (  six) hours as needed for mild pain (or Fever >/= 101).   apixaban 5 MG Tabs tablet Commonly known as: ELIQUIS Take 1 tablet by mouth 2 (two) times daily.   diclofenac sodium 1 % Gel Commonly known as: VOLTAREN Apply 2 g topically 3 (three) times daily as needed (knee pain).   Multivitamin Gummies [employer] 1 each by mouth daily.   neomycin-bacitracin-polymyxin ointment Commonly known as: NEOSPORIN Apply 1 application topically 2 (two) times daily.   ondansetron 4 MG tablet Commonly known as: ZOFRAN Take 1 tablet (4 mg total) by mouth every 6 (six) hours as needed for nausea.   pantoprazole 40 MG tablet Commonly known as: PROTONIX TAKE 1 TABLET BY MOUTH EVERY DAY What changed:   when to take this  reasons to take this   Prenatal Vitamins 28-0.8 MG Tabs Take 1 tablet by mouth daily.            Discharge Care Instructions  (From admission, onward)         Start     Ordered   02/24/20 0000  Discharge wound care:       Comments: Apply Neosporin ointment as advised to your postsurgical/postoperative wound   02/24/20 1438          Major procedures and Radiology Reports - PLEASE review detailed and final reports for all details, in brief -   CT ABDOMEN PELVIS W CONTRAST  Result Date:  02/23/2020 CLINICAL DATA:  Abdominal pain, fever, umbilical hernia repair 3-4 weeks prior EXAM: CT ABDOMEN AND PELVIS WITH CONTRAST TECHNIQUE: Multidetector CT imaging of the abdomen and pelvis was performed using the standard protocol following bolus administration of intravenous contrast. CONTRAST:  OMNIPAQUE IOHEXOL 300 MG/ML  SOLN COMPARISON:  CT 01/17/2020, ultrasound 01/01/2020 FINDINGS: Lower chest: Bandlike areas of subsegmental atelectasis or scarring in the otherwise clear lung bases. Normal heart size. No pericardial effusion. Coronary artery atherosclerosis is present. Hepatobiliary: No worrisome focal liver lesions. Smooth liver surface contour. Normal hepatic attenuation. Mildly distended gallbladder without wall thickening or visible calcified gallstones. No biliary ductal dilatation. Pancreas: Unremarkable. No pancreatic ductal dilatation or surrounding inflammatory changes. Spleen: Normal in size without focal abnormality. Adrenals/Urinary Tract: Normal adrenal glands. Redemonstration of a thick-walled cystic focus in the upper pole right kidney measuring 5.3 x 2.4 cm, grossly unchanged from multiple comparison. No other focal concerning renal lesion. 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 Single left ureter. No obstructive urolithiasis or frank hydronephrosis. Circumferential urinary bladder wall thickening and perivesicular hazy stranding. Stomach/Bowel: Sliding-type hiatal hernia. Mild gastric wall thickening possibly related to underdistention. Duodenum takes a normal course across the midline abdomen. Some mild thickening of the small bowel is in the vicinity of the postsurgical change. A normal appendix is visualized. The colon demonstrates some mild diffuse pancolonic thickening though possibly accentuated by underdistention. Much of the proximal colon is fluid-filled. Minimal formed stool in the distal colon and rectum. Vascular/Lymphatic: Atherosclerotic  calcifications within the abdominal aorta and branch vessels. No aneurysm or ectasia. No enlarged abdominopelvic lymph nodes. Reproductive: Anteverted uterus. Some atypical nodular thickening of the endometrium is noted (6/60) seen on comparison ultrasound in suspicious for a small endometrial polyp. No concerning adnexal lesions. Other: Postsurgical changes along the anterior abdominal wall with phlegmonous change at the site of prior umbilical hernia and a small oblong thick-walled rim enhancing fluid collection measuring 4.3 by 1.0 by 3.7 cm in size with some mild surrounding inflammatory change as well  as additional thickening and phlegmon external to the rectus sheath though possibly contiguous with this collection (2/33). No other abdominopelvic collections. No free air or fluid in the abdomen or pelvis. No new no bowel containing hernias. Musculoskeletal: Multilevel degenerative changes are present in the imaged portions of the spine. No acute osseous abnormality or suspicious osseous lesion. IMPRESSION: 1. Postsurgical changes along the anterior abdominal wall with a small oblong thick-walled rim enhancing fluid collection measuring 4.3 x 1.0 x 3.7 cm in size with some mild surrounding inflammatory change as well as additional thickening and phlegmon external to the rectus sheath though appearing contiguous with this collection. Could reflect some postoperative hematoma or seroma though superinfection/abscess is suspected given patient's presenting symptoms and surrounding inflammation. 2. Circumferential urinary bladder wall thickening and perivesicular hazy stranding, correlate with urinalysis to exclude cystitis. 3. Some reactive thickening of the small bowel adjacent the collection with multitude of fluid-filled loops as well as a mildly edematous and thickened colon with lack of formed stool. Findings could reflect infectious or inflammatory enterocolitis with rapid transit state. 4. Sliding-type hiatal  hernia. 5. Some atypical nodular thickening of the endometrium again seen an previously characterized as possible polyp on prior pelvic ultrasound. Recommend outpatient consultation for hysteroscopy if not previously obtained. 6. Aortic Atherosclerosis (ICD10-I70.0). Electronically Signed   By: Kreg Shropshire M.D.   On: 02/23/2020 03:42    Micro Results    Recent Results (from the past 240 hour(s))  SARS Coronavirus 2 by RT PCR (hospital order, performed in [facility] hospital lab) Nasopharyngeal Nasopharyngeal Swab     Status: None   Collection Time: 02/23/20  4:44 AM   Specimen: Nasopharyngeal Swab  Result Value Ref Range Status   SARS Coronavirus 2 NEGATIVE NEGATIVE Final    Comment: (NOTE) SARS-CoV-2 target nucleic acids are NOT DETECTED.  The SARS-CoV-2 RNA is generally detectable in upper and lower respiratory specimens during the acute phase of infection. The lowest concentration of SARS-CoV-2 viral copies this assay can detect is 250 copies / mL. A negative result does not preclude SARS-CoV-2 infection and should not be used as the sole basis for treatment or other patient management decisions.  A negative result may occur with improper specimen collection / handling, submission of specimen other than nasopharyngeal swab, presence of viral mutation(s) within the areas targeted by this assay, and inadequate number of viral copies (<250 copies / mL). A negative result must be combined with clinical observations, patient history, and epidemiological information.  Fact Sheet for Patients:   BoilerBrush.com.cy  Fact Sheet for Healthcare Providers: https://pope.com/  This test is not yet approved or  cleared by the [place] FDA and has been authorized for detection and/or diagnosis of SARS-CoV-2 by FDA under an Emergency Use Authorization (EUA).  This EUA will remain in effect (meaning this test can be used) for the duration of  the COVID-19 declaration under Section 564(b)(1) of the Act, 21 U.S.C. section 360bbb-3(b)(1), unless the authorization is terminated or revoked sooner.  Performed at [facility], [address], [place], [place] [address]        Today   Subjective    Christina [name] today has no complaints -- tolerating oral intake well no fever no chills no nausea no vomiting no diarrhea          Patient has been seen and examined prior to discharge   Objective   Blood pressure 134/84, pulse 75, temperature 98.5 F (36.9 C), temperature source Oral, resp. rate 20,  height 5\' 9"  (1.753 m), weight 67.4 kg, SpO2 100 %.   Intake/Output Summary (Last 24 hours) at 02/24/2020 1439 Last data filed at 02/24/2020 0600 Gross per 24 hour  Intake 2233.57 ml  Output --  Net 2233.57 ml    Exam Gen:- Awake Alert, no acute distress  HEENT:- [place].AT, No sclera icterus Neck-Supple Neck,No JVD,.  Lungs-  CTAB , good air movement bilaterally  CV- S1, S2 normal, regular Abd-  +ve B.Sounds, Abd Soft, No tenderness,    Extremity/Skin:- No  edema,   good pulses Psych-affect is appropriate, oriented x3 Neuro-no new focal deficits, no tremors    Data Review   CBC w Diff:  Lab Results  Component Value Date   WBC 4.0 02/24/2020   HGB 10.0 (L) 02/24/2020   HGB 11.8 02/07/2020   HCT 32.5 (L) 02/24/2020   HCT 35.8 02/07/2020   PLT 250 02/24/2020   PLT 396 02/07/2020   LYMPHOPCT 5 02/23/2020   MONOPCT 5 02/23/2020   EOSPCT 0 02/23/2020   BASOPCT 0 02/23/2020    CMP:  Lab Results  Component Value Date   NA 143 02/24/2020   NA 146 (H) 02/07/2020   K 3.9 02/24/2020   CL 115 (H) 02/24/2020   CO2 22 02/24/2020   BUN 7 (L) 02/24/2020   BUN 10 02/07/2020   CREATININE 0.71 02/24/2020   CREATININE 1.09 02/01/2013   PROT 6.4 (L) 02/23/2020   PROT 6.1 02/07/2020   ALBUMIN 3.4 (L) 02/23/2020   ALBUMIN 3.4 (L) 02/07/2020   BILITOT 0.8 02/23/2020   BILITOT 0.6 02/07/2020   ALKPHOS 87 02/23/2020   AST  36 02/23/2020   ALT 20 02/23/2020  .   Total Discharge time is about 33 minutes  04/25/2020 M.D on 02/24/2020 at 2:39 PM  Go to [email or web address] -  for contact info  [facility] - Office  [phone]

## 2020-02-24 NOTE — Plan of Care (Signed)
Adequate for discharge.

## 2020-02-26 ENCOUNTER — Telehealth: Payer: Self-pay | Admitting: Nurse Practitioner

## 2020-02-26 ENCOUNTER — Ambulatory Visit: Payer: BC Managed Care – PPO | Admitting: Nurse Practitioner

## 2020-02-26 ENCOUNTER — Ambulatory Visit (INDEPENDENT_AMBULATORY_CARE_PROVIDER_SITE_OTHER): Payer: BC Managed Care – PPO

## 2020-02-26 ENCOUNTER — Other Ambulatory Visit: Payer: Self-pay

## 2020-02-26 ENCOUNTER — Encounter: Payer: Self-pay | Admitting: Nurse Practitioner

## 2020-02-26 VITALS — BP 145/91 | HR 98 | Temp 97.8°F | Resp 20 | Ht 69.0 in | Wt 146.0 lb

## 2020-02-26 DIAGNOSIS — M778 Other enthesopathies, not elsewhere classified: Secondary | ICD-10-CM | POA: Diagnosis not present

## 2020-02-26 DIAGNOSIS — M25532 Pain in left wrist: Secondary | ICD-10-CM

## 2020-02-26 NOTE — Telephone Encounter (Signed)
  Prescription Request  02/26/2020  What is the name of the medication or equipment? Eloquise RX  Have you contacted your pharmacy to request a refill? (if applicable) Yes  Which pharmacy would you like this sent to? [facility]   Patient notified that their request is being sent to the clinical staff for review and that they should receive a response within 2 business days.   MMM's pt.  Pt was seen today w/MMM.

## 2020-02-26 NOTE — Progress Notes (Signed)
Subjective:    Patient ID: Christina [name], female    DOB: 05/01/1951, 69 y.o.   MRN: [id number]   Chief Complaint: Hand Pain   HPI Patient reports swelling in her left hand and wrist. The wrist is swollen compared to the other arm. A bruise is noted to the top of the wrist. ROM is decreased in the painful wrist. She has weakness and is unable to grip objects with that hand. Reports it began to hurt suddenly 3-4 weeks ago when she started to notice the pain in that wrist and it worsened over time. Was discharged from the hospital Monday of this week and the IV was in that arm, but closer to the upper forearm. She was admitted for a hernia operation. Pain in her wrist has gotten worse since she was discharged.     Review of Systems  Constitutional: Negative.   HENT: Negative.   Eyes: Negative.   Respiratory: Negative.   Cardiovascular: Negative.   Gastrointestinal: Negative.   Endocrine: Negative.   Genitourinary: Negative.   Musculoskeletal: Negative.   Skin: Negative.   Allergic/Immunologic: Negative.   Neurological: Negative.   Hematological: Negative.   Psychiatric/Behavioral: Negative.        Objective:   Physical Exam Vitals and nursing note reviewed.  Constitutional:      Appearance: Normal appearance. She is normal weight.  HENT:     Head: Normocephalic and atraumatic.     Right Ear: Tympanic membrane, ear canal and external ear normal.     Left Ear: Tympanic membrane, ear canal and external ear normal.     Nose: Nose normal.     Mouth/Throat:     Mouth: Mucous membranes are moist.     Pharynx: Oropharynx is clear.  Eyes:     Extraocular Movements: Extraocular movements intact.     Conjunctiva/sclera: Conjunctivae normal.     Pupils: Pupils are equal, round, and reactive to light.  Cardiovascular:     Rate and Rhythm: Normal rate and regular rhythm.     Pulses: Normal pulses.     Heart sounds: Normal heart sounds.  Pulmonary:     Effort: Pulmonary  effort is normal.     Breath sounds: Normal breath sounds.  Abdominal:     General: Abdomen is flat. Bowel sounds are normal.     Palpations: Abdomen is soft.  Genitourinary:    General: Normal vulva.     Rectum: Normal.  Musculoskeletal:     Left wrist: Swelling and bony tenderness present. Decreased range of motion.     Left hand: Swelling and bony tenderness present. Decreased range of motion. Decreased strength.     Cervical back: Normal range of motion and neck supple.  Skin:    General: Skin is warm and dry.     Capillary Refill: Capillary refill takes less than 2 seconds.  Neurological:     General: No focal deficit present.     Mental Status: She is alert and oriented to person, place, and time. Mental status is at baseline.  Psychiatric:        Mood and Affect: Mood normal.        Thought Content: Thought content normal.        Judgment: Judgment normal.      BP (!) 145/91    Pulse 98    Temp 97.8 F (36.6 C) (Temporal)    Resp 20    Ht 5\' 9"  (1.753 m)    Wt 146 lb (  66.2 kg)    LMP  (LMP Unknown)    SpO2 100%    BMI 21.56 kg/m   Left wrist- mild arthritic changes-Preliminary reading by Paulene Floor, FNP  [facility]     Assessment & Plan:  Christina [name] in today with chief complaint of Hand Pain (No injury) and Arm Pain   1. Left wrist pain - DG Wrist Complete Left  2. Left wrist tendonitis Moist heat Wrist brace Motrin or tylenol OTC    The above assessment and management plan was discussed with the patient. The patient verbalized understanding of and has agreed to the management plan. Patient is aware to call the clinic if symptoms persist or worsen. Patient is aware when to return to the clinic for a follow-up visit. Patient educated on when it is appropriate to go to the emergency department.   Mary-Margaret Daphine Deutscher, FNP

## 2020-02-26 NOTE — Patient Instructions (Signed)
Wrist Pain, Adult There are many things that can cause wrist pain. Some common causes include:  An injury to the wrist area.  Overuse of the joint.  A condition that causes too much pressure to be put on a nerve in the wrist (carpal tunnel syndrome).  Wear and tear of the joints that happens as a person gets older (osteoarthritis).  Other types of arthritis. Sometimes, the cause of wrist pain is not known. Often, the pain goes away when you follow your doctor's instructions for helping pain at home, such as resting or icing your wrist. If your wrist pain does not go away, it is important to tell your doctor. Follow these instructions at home:  Rest the wrist area for 48 hours or more, or as long as told by your doctor.  If a splint or elastic bandage has been put on your wrist, use it as told by your doctor. ? Take off the splint or bandage only as told by your doctor. ? Loosen the splint or bandage if your fingers tingle, lose feeling (get numb), or turn cold or blue.  If directed, apply ice to the injured area: ? If you have a removable splint or elastic bandage, remove it as told by your doctor. ? Put ice in a plastic bag. ? Place a towel between your skin and the bag or between your splint or bandage and the bag. ? Leave the ice on for 20 minutes, 2-3 times a day.   Keep your arm raised (elevated) above the level of your heart while you are sitting or lying down.  Take over-the-counter and prescription medicines only as told by your doctor.  Keep all follow-up visits as told by your doctor. This is important. Contact a doctor if:  You have a sudden sharp pain in the wrist, hand, or arm that is different or new.  The swelling or bruising on your wrist or hand gets worse.  Your skin becomes red, gets a rash, or has open sores.  Your pain does not get better or it gets worse. Get help right away if:  You lose feeling in your fingers or hand.  Your fingers turn white,  very red, or cold and blue.  You cannot move your fingers.  You have a fever or chills. This information is not intended to replace advice given to you by your health care provider. Make sure you discuss any questions you have with your health care provider. Document Revised: 07/14/2017 Document Reviewed: 02/18/2016 Elsevier Patient Education  2020 Elsevier Inc.    

## 2020-02-27 MED ORDER — APIXABAN 5 MG PO TABS: 5.0000 mg | ORAL_TABLET | Freq: Two times a day (BID) | ORAL | 2 refills | Status: DC

## 2020-02-27 NOTE — Telephone Encounter (Signed)
Prescription sent to pharmacy.

## 2020-03-09 ENCOUNTER — Telehealth: Payer: Self-pay | Admitting: Nurse Practitioner

## 2020-03-09 NOTE — Telephone Encounter (Signed)
Spoke with patient and will start back on blood prssure meds and willkeep check of blood pressure

## 2020-03-09 NOTE — Telephone Encounter (Signed)
Pt called and said mmm wanted to know her bp reading. She said it was 123/74, heart rate is 100. She has fever of 99.1 and was throwing up after taking bp meds

## 2020-03-09 NOTE — Telephone Encounter (Signed)
Patient states that her bp has been elevated this weekend and today. Patient states that she also has had a headache. She states that the hospital had stopped bp medication and she feels that she nay need to go back on something. BP while on phone with me was 146/79

## 2020-03-20 ENCOUNTER — Ambulatory Visit (INDEPENDENT_AMBULATORY_CARE_PROVIDER_SITE_OTHER): Payer: BC Managed Care – PPO | Admitting: Nurse Practitioner

## 2020-03-20 ENCOUNTER — Encounter: Payer: Self-pay | Admitting: Nurse Practitioner

## 2020-03-20 DIAGNOSIS — I10 Essential (primary) hypertension: Secondary | ICD-10-CM

## 2020-03-20 NOTE — Progress Notes (Signed)
Virtual Visit via telephone Note Due to COVID-19 pandemic this visit was conducted virtually. This visit type was conducted due to national recommendations for restrictions regarding the COVID-19 Pandemic (e.g. social distancing, sheltering in place) in an effort to limit this patient's exposure and mitigate transmission in our community. All issues noted in this document were discussed and addressed.  A physical exam was not performed with this format.  I connected with Christina [name] on 03/20/20 at 2:05 by telephone and verified that I am speaking with the correct person using two identifiers. Christina [name] is currently located at home and no one is currently with her during visit. The provider, Donn Pierini, RN is located in their office at time of visit.  I discussed the limitations, risks, security and privacy concerns of performing an evaluation and management service by telephone and the availability of in person appointments. I also discussed with the patient that there may be a patient responsible charge related to this service. The patient expressed understanding and agreed to proceed.   History and Present Illness:   Chief Complaint: Hypertension   HPI Reports when she takes her medication, it makes her throw up. She is not taking it at all at this time. She took the pill last approximately 3 weeks ago. She does check her BP at home, 142/87 was her last BP after she stopped the medication. Denies blurry vision other than when she takes her glasses off, but she does sometimes get headaches in the top of her head that feel like pressure. * last check blood pressure was 116/85. She has bene taking olmesartan 40/12.5  And amlodipine 5mg  and evertime she takes she gets sick and throws up   Review of Systems  Constitutional: Negative.   HENT: Negative.   Eyes: Negative.   Respiratory: Negative.   Cardiovascular: Negative.   Gastrointestinal: Negative.     Genitourinary: Negative.   Musculoskeletal: Negative.   Skin: Negative.   Neurological: Positive for headaches.  Endo/Heme/Allergies: Negative.   Psychiatric/Behavioral: Negative.      Observations/Objective: Alert and oriented- answers all questions appropriately No distress    Assessment and Plan: Knittel in today with chief complaint of Hypertension   1. Essential hypertension Hold olmesartan for now Will take amlodipine 5mg  dialy and keep diray of blood presure- if it stays above 140 systolic then will stop amlopdipine and take olmesartan. Trying to figure out which one will work without making hr sick. She will call back on monday and let me know how she is doing.    Follow Up Instructions: prn    I discussed the assessment and treatment plan with the patient. The patient was provided an opportunity to ask questions and all were answered. The patient agreed with the plan and demonstrated an understanding of the instructions.   The patient was advised to call back or seek an in-person evaluation if the symptoms worsen or if the condition fails to improve as anticipated.  The above assessment and management plan was discussed with the patient. The patient verbalized understanding of and has agreed to the management plan. Patient is aware to call the clinic if symptoms persist or worsen. Patient is aware when to return to the clinic for a follow-up visit. Patient educated on when it is appropriate to go to the emergency department.   Time call ended:  2:20 I provided 15 minutes of non-face-to-face time during this encounter.    , RN  Mary-Margaret Hassell Done, FNP

## 2020-03-25 ENCOUNTER — Other Ambulatory Visit: Payer: Self-pay | Admitting: Nurse Practitioner

## 2020-03-25 DIAGNOSIS — I10 Essential (primary) hypertension: Secondary | ICD-10-CM

## 2020-03-26 ENCOUNTER — Telehealth: Payer: Self-pay

## 2020-03-26 NOTE — Telephone Encounter (Signed)
Patient wanted to make you aware that when she checked her blood pressure this morning it was 160/87, pulse 107.  She waited and while and rechecked and it was 140/80, pulse 90.  She is taking the Amlodipine 5 mg.  Do you want her to make any changes?

## 2020-03-26 NOTE — Telephone Encounter (Signed)
Keep doing what doing what doing through the weekend

## 2020-03-31 ENCOUNTER — Telehealth: Payer: Self-pay

## 2020-03-31 NOTE — Telephone Encounter (Signed)
Left message for patient to return call.

## 2020-04-02 ENCOUNTER — Other Ambulatory Visit: Payer: Self-pay | Admitting: Nurse Practitioner

## 2020-04-02 DIAGNOSIS — I10 Essential (primary) hypertension: Secondary | ICD-10-CM

## 2020-04-14 ENCOUNTER — Other Ambulatory Visit: Payer: Self-pay | Admitting: Nurse Practitioner

## 2020-04-14 ENCOUNTER — Telehealth: Payer: Self-pay | Admitting: Nurse Practitioner

## 2020-04-14 DIAGNOSIS — K219 Gastro-esophageal reflux disease without esophagitis: Secondary | ICD-10-CM

## 2020-04-14 NOTE — Telephone Encounter (Signed)
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 She wanted to know what to do. She says swelling does go down at [employer] she is sleeping.patient was told to wear compression socks.

## 2020-04-23 ENCOUNTER — Ambulatory Visit: Payer: Self-pay | Admitting: Nurse Practitioner

## 2020-05-04 ENCOUNTER — Other Ambulatory Visit: Payer: Self-pay

## 2020-05-04 ENCOUNTER — Other Ambulatory Visit: Payer: Self-pay | Admitting: Nurse Practitioner

## 2020-05-04 ENCOUNTER — Other Ambulatory Visit: Payer: BC Managed Care – PPO

## 2020-05-04 DIAGNOSIS — Z20822 Contact with and (suspected) exposure to covid-19: Secondary | ICD-10-CM

## 2020-05-06 LAB — NOVEL CORONAVIRUS, NAA: SARS-CoV-2, NAA: NOT DETECTED

## 2020-05-06 LAB — SARS-COV-2, NAA 2 DAY TAT

## 2020-05-17 ENCOUNTER — Other Ambulatory Visit: Payer: Self-pay | Admitting: Nurse Practitioner

## 2020-05-17 DIAGNOSIS — I10 Essential (primary) hypertension: Secondary | ICD-10-CM

## 2020-05-20 ENCOUNTER — Other Ambulatory Visit: Payer: Self-pay | Admitting: Family

## 2020-05-21 ENCOUNTER — Ambulatory Visit (INDEPENDENT_AMBULATORY_CARE_PROVIDER_SITE_OTHER): Payer: BC Managed Care – PPO

## 2020-05-21 ENCOUNTER — Ambulatory Visit (INDEPENDENT_AMBULATORY_CARE_PROVIDER_SITE_OTHER): Payer: BC Managed Care – PPO | Admitting: Nurse Practitioner

## 2020-05-21 ENCOUNTER — Encounter: Payer: Self-pay | Admitting: Nurse Practitioner

## 2020-05-21 ENCOUNTER — Other Ambulatory Visit: Payer: Self-pay

## 2020-05-21 VITALS — BP 146/83 | HR 84 | Temp 97.6°F | Resp 20 | Ht 69.0 in | Wt 152.0 lb

## 2020-05-21 DIAGNOSIS — E785 Hyperlipidemia, unspecified: Secondary | ICD-10-CM | POA: Diagnosis not present

## 2020-05-21 DIAGNOSIS — R609 Edema, unspecified: Secondary | ICD-10-CM

## 2020-05-21 DIAGNOSIS — M10072 Idiopathic gout, left ankle and foot: Secondary | ICD-10-CM

## 2020-05-21 DIAGNOSIS — Z23 Encounter for immunization: Secondary | ICD-10-CM | POA: Diagnosis not present

## 2020-05-21 DIAGNOSIS — Z780 Asymptomatic menopausal state: Secondary | ICD-10-CM

## 2020-05-21 DIAGNOSIS — I10 Essential (primary) hypertension: Secondary | ICD-10-CM | POA: Diagnosis not present

## 2020-05-21 DIAGNOSIS — Z13820 Encounter for screening for osteoporosis: Secondary | ICD-10-CM

## 2020-05-21 DIAGNOSIS — K219 Gastro-esophageal reflux disease without esophagitis: Secondary | ICD-10-CM

## 2020-05-21 MED ORDER — AMLODIPINE BESYLATE 5 MG PO TABS: 5.0000 mg | ORAL_TABLET | Freq: Every day | ORAL | 1 refills | Status: DC

## 2020-05-21 MED ORDER — APIXABAN 5 MG PO TABS: 5.0000 mg | ORAL_TABLET | Freq: Two times a day (BID) | ORAL | 5 refills | Status: DC

## 2020-05-21 MED ORDER — PANTOPRAZOLE SODIUM 40 MG PO TBEC: 40.0000 mg | DELAYED_RELEASE_TABLET | Freq: Every day | ORAL | 1 refills | Status: DC

## 2020-05-21 MED ORDER — V-2 HIGH COMPRESSION HOSE MISC: 1.0000 | Freq: Every day | 0 refills | Status: DC

## 2020-05-21 NOTE — Progress Notes (Signed)
Subjective:    Patient ID: Christina [name], female    DOB: Jul 12, 1951, 69 y.o.   MRN: [id number]   Chief Complaint: Medical Management of Chronic Issues    HPI:  1. Essential hypertension, benign No c/o chest pain, sob or headache. Does not check blood pressure at home. BP Readings from Last 3 Encounters:  05/21/20 (!) 146/83  02/26/20 (!) 145/91  02/24/20 134/84     2. Hyperlipidemia with target LDL less than 100 Does watch diet and stays very active. Lab Results  Component Value Date   CHOL 194 08/06/2018   HDL 63 08/06/2018   LDLCALC 110 (H) 08/06/2018   TRIG 103 08/06/2018   CHOLHDL 3.1 08/06/2018     3. Peripheral edema Has daily edema by end of day. Will resolve mostly at night  4. Acute idiopathic gout involving toe of left foot Has had no recent gout flare ups     Outpatient Encounter Medications as of 05/21/2020  Medication Sig  . acetaminophen (TYLENOL) 325 MG tablet Take 2 tablets (650 mg total) by mouth every 6 (six) hours as needed for mild pain (or Fever >/= 101).  Marland Kitchen amLODipine (NORVASC) 5 MG tablet TAKE 1 TABLET BY MOUTH EVERY DAY  . diclofenac sodium (VOLTAREN) 1 % GEL Apply 2 g topically 3 (three) times daily as needed (knee pain).  Marland Kitchen ELIQUIS 5 MG TABS tablet TAKE 1 TABLET BY MOUTH TWICE A DAY  . Multiple Vitamins-Minerals (MULTIVITAMIN GUMMIES WOMENS) CHEW Chew 1 each by mouth daily.  Marland Kitchen neomycin-bacitracin-polymyxin (NEOSPORIN) ointment Apply 1 application topically 2 (two) times daily.  . ondansetron (ZOFRAN) 4 MG tablet Take 1 tablet (4 mg total) by mouth every 6 (six) hours as needed for nausea.  . pantoprazole (PROTONIX) 40 MG tablet TAKE 1 TABLET BY MOUTH EVERY DAY     Past Surgical History:  Procedure Laterality Date  . COLONOSCOPY  2015  . CYSTECTOMY     From shoulder and neck  . KNEE ARTHROSCOPY Left    Dr Veverly Fells  . SKIN GRAFT     ulcer on right leg   . TOTAL KNEE ARTHROPLASTY Right 08/23/2019   Procedure: TOTAL KNEE  ARTHROPLASTY;  Surgeon: Netta Cedars, MD;  Location: [facility];  Service: Orthopedics;  Laterality: Right;  . TUBAL LIGATION     Multiple  . UMBILICAL HERNIA REPAIR N/A 01/27/2020   Procedure: EPIGASTRIC HERNIA REPAIR WITH MESH;  Surgeon: Virl Cagey, MD;  Location: [facility];  Service: General;  Laterality: N/A;  epicastric site    Family History  Problem Relation Age of Onset  . Heart attack Mother   . Deep vein thrombosis Mother   . Diabetes Mother   . Heart disease Mother   . Hyperlipidemia Mother   . Cancer Father   . Hyperlipidemia Sister   . Deep vein thrombosis Daughter   . 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  Eyes: Negative for pain.  Respiratory: Negative for shortness of breath.   Cardiovascular: Negative for chest pain, palpitations and leg swelling.  Gastrointestinal: Negative for abdominal pain.  Endocrine: Negative for polydipsia.  Skin: Negative for rash.  Neurological: Negative for dizziness, weakness and headaches.  Hematological: Does not bruise/bleed easily.  All other systems reviewed and  are negative.      Objective:   Physical Exam Vitals and nursing note reviewed.  Constitutional:      General: She is not in acute distress.    Appearance: Normal appearance. She is well-developed.  HENT:     Head: Normocephalic.     Nose: Nose normal.  Eyes:     Pupils: Pupils are equal, round, and reactive to light.  Neck:     Vascular: No carotid bruit or JVD.  Cardiovascular:     Rate and Rhythm: Normal rate and regular rhythm.     Heart sounds: Normal heart sounds.  Pulmonary:     Effort: Pulmonary effort is normal. No respiratory distress.     Breath sounds: Normal breath sounds. No wheezing or rales.  Chest:     Chest wall: No tenderness.    Abdominal:     General: Bowel sounds are normal. There is no distension or abdominal bruit.     Palpations: Abdomen is soft. There is no hepatomegaly, splenomegaly, mass or pulsatile mass.     Tenderness: There is no abdominal tenderness.  Musculoskeletal:        General: Normal range of motion.     Cervical back: Normal range of motion and neck supple.  Lymphadenopathy:     Cervical: No cervical adenopathy.  Skin:    General: Skin is warm and dry.  Neurological:     Mental Status: She is alert and oriented to person, place, and time.     Deep Tendon Reflexes: Reflexes are normal and symmetric.  Psychiatric:        Behavior: Behavior normal.        Thought Content: Thought content normal.        Judgment: Judgment normal.       BP (!) 146/83   Pulse 84   Temp 97.6 F (36.4 C) (Temporal)   Resp 20   Ht _0  (1.753 m)   Wt 152 lb (68.9 kg)   LMP  (LMP Unknown)   SpO2 100%   BMI 22.45 kg/m      Assessment & Plan:  Christina [name] comes in today with chief complaint of Medical Management of Chronic Issues   Diagnosis and orders addressed:  1. Essential hypertension, benign Low sodium diet - amLODipine (NORVASC) 5 MG tablet; Take 1 tablet (5 mg total) by mouth daily.  Dispense: 90 tablet; Refill: 1 - CBC with Differential/Platelet - CMP14+EGFR  2. Hyperlipidemia with target LDL less than 100 Low fat diet - Lipid panel  3. Peripheral edema Elevate when sitting  compresion socks ordered - Elastic Bandages & Supports (V-2 HIGH COMPRESSION HOSE) MISC; 1 each by Does not apply route daily.  Dispense: 1 each; Refill: 0  4. Acute idiopathic gout involving toe of left foot Low purine diet  5. Gastroesophageal reflux disease without esophagitis Avoid spicy foods Do not eat 2 hours prior to bedtime - pantoprazole (PROTONIX) 40 MG tablet; Take 1 tablet (40 mg total) by mouth daily.  Dispense: 90 tablet; Refill: 1   Labs pending Health Maintenance  reviewed Diet and exercise encouraged  Follow up plan: 6 months   Mary-Margaret Hassell Done, FNP

## 2020-05-21 NOTE — Patient Instructions (Signed)

## 2020-06-01 ENCOUNTER — Encounter: Payer: Self-pay | Admitting: *Deleted

## 2020-06-03 ENCOUNTER — Other Ambulatory Visit (INDEPENDENT_AMBULATORY_CARE_PROVIDER_SITE_OTHER): Payer: Self-pay | Admitting: *Deleted

## 2020-06-03 DIAGNOSIS — K760 Fatty (change of) liver, not elsewhere classified: Secondary | ICD-10-CM

## 2020-06-03 DIAGNOSIS — R945 Abnormal results of liver function studies: Secondary | ICD-10-CM

## 2020-06-03 DIAGNOSIS — R7989 Other specified abnormal findings of blood chemistry: Secondary | ICD-10-CM

## 2020-06-13 ENCOUNTER — Other Ambulatory Visit: Payer: Self-pay | Admitting: Nurse Practitioner

## 2020-06-13 MED ORDER — CEPHALEXIN 500 MG PO CAPS: 500.0000 mg | ORAL_CAPSULE | Freq: Two times a day (BID) | ORAL | 0 refills | Status: DC

## 2020-06-16 ENCOUNTER — Ambulatory Visit: Payer: Self-pay | Admitting: Nurse Practitioner

## 2020-06-24 LAB — HEPATIC FUNCTION PANEL
AG Ratio: 1.6 (calc) (ref 1.0–2.5)
ALT: 19 U/L (ref 6–29)
AST: 31 U/L (ref 10–35)
Albumin: 4.1 g/dL (ref 3.6–5.1)
Alkaline phosphatase (APISO): 135 U/L (ref 37–153)
Bilirubin, Direct: 0.1 mg/dL (ref 0.0–0.2)
Globulin: 2.5 g/dL (calc) (ref 1.9–3.7)
Indirect Bilirubin: 0.3 mg/dL (calc) (ref 0.2–1.2)
Total Bilirubin: 0.4 mg/dL (ref 0.2–1.2)
Total Protein: 6.6 g/dL (ref 6.1–8.1)

## 2020-06-29 ENCOUNTER — Other Ambulatory Visit: Payer: BC Managed Care – PPO

## 2020-06-29 ENCOUNTER — Other Ambulatory Visit: Payer: Self-pay

## 2020-06-29 DIAGNOSIS — R300 Dysuria: Secondary | ICD-10-CM

## 2020-06-29 LAB — URINALYSIS, MICROSCOPIC ONLY
Bacteria, UA: NONE SEEN
RBC, Urine: NONE SEEN /hpf (ref 0–2)

## 2020-06-30 LAB — URINE CULTURE: Organism ID, Bacteria: NO GROWTH

## 2020-07-02 ENCOUNTER — Other Ambulatory Visit: Payer: Self-pay | Admitting: Nurse Practitioner

## 2020-07-02 MED ORDER — FLUCONAZOLE 150 MG PO TABS: 150.0000 mg | ORAL_TABLET | Freq: Once | ORAL | 0 refills | Status: AC

## 2020-09-08 ENCOUNTER — Telehealth (INDEPENDENT_AMBULATORY_CARE_PROVIDER_SITE_OTHER): Payer: BC Managed Care – PPO | Admitting: Internal Medicine

## 2020-09-08 ENCOUNTER — Encounter (INDEPENDENT_AMBULATORY_CARE_PROVIDER_SITE_OTHER): Payer: Self-pay | Admitting: Internal Medicine

## 2020-09-08 ENCOUNTER — Other Ambulatory Visit: Payer: Self-pay

## 2020-09-08 VITALS — Ht 69.0 in | Wt 152.0 lb

## 2020-09-08 DIAGNOSIS — R945 Abnormal results of liver function studies: Secondary | ICD-10-CM

## 2020-09-08 DIAGNOSIS — K219 Gastro-esophageal reflux disease without esophagitis: Secondary | ICD-10-CM | POA: Diagnosis not present

## 2020-09-08 DIAGNOSIS — R7989 Other specified abnormal findings of blood chemistry: Secondary | ICD-10-CM

## 2020-09-08 MED ORDER — METAMUCIL SMOOTH TEXTURE 58.6 % PO POWD: 1.0000 | Freq: Every day | ORAL | Status: DC

## 2020-09-08 NOTE — Progress Notes (Signed)
Virtual Visit via Telephone Note  I connected with Christina [name] on 09/08/20 at  2:25 PM EST by telephone and verified that I am speaking with the correct person using two identifiers.  Location: Patient: work Provider: home   I discussed the limitations, risks, security and privacy concerns of performing an evaluation and management service by telephone and the availability of in person appointments. I also discussed with the patient that there may be a patient responsible charge related to this service. The patient expressed understanding and agreed to proceed.   History of Present Illness:  Patient is 70 year old Afro-American female who was evaluated back in 2018 with elevated transaminases.  She was felt to have fatty liver.  Her transaminases have normalized.  Therefore liver biopsy was not recommended. She was hospitalized in June last year for abdominal pain nausea vomiting and 50 pound weight loss.  She was felt to have ventral hernia with intermittent obstruction.  She underwent surgery on 01/27/2020.  She was last seen in our office on 02/18/2020 and was doing well.  Patient says she is doing well.  She has gained 18 pounds since her surgery.  She has sporadic nausea without vomiting.  Ondansetron helps.  Last dose was 1 week ago.  She says she has a good appetite.  She does complain of bloating and at times notices burning with defecation.  She does not feel that she has diarrhea and/or constipation.  She denies rectal bleeding or melena. She had normal colonoscopy in August 2015.  Current Outpatient Medications:  .  acetaminophen (TYLENOL) 325 MG tablet, Take 2 tablets (650 mg total) by mouth every 6 (six) hours as needed for mild pain (or Fever >/= 101)., Disp: , Rfl:  .  amLODipine (NORVASC) 5 MG tablet, Take 1 tablet (5 mg total) by mouth daily., Disp: 90 tablet, Rfl: 1 .  apixaban (ELIQUIS) 5 MG TABS tablet, Take 1 tablet (5 mg total) by mouth 2 (two) times daily., Disp: 60  tablet, Rfl: 5 .  diclofenac sodium (VOLTAREN) 1 % GEL, Apply 2 g topically 3 (three) times daily as needed (knee pain)., Disp: , Rfl:  .  Elastic Bandages & Supports (V-2 HIGH COMPRESSION HOSE) MISC, 1 each by Does not apply route daily., Disp: 1 each, Rfl: 0 .  Multiple Vitamins-Minerals (MULTIVITAMIN GUMMIES WOMENS) CHEW, Chew 1 each by mouth daily., Disp: , Rfl:  .  neomycin-bacitracin-polymyxin (NEOSPORIN) ointment, Apply 1 application topically 2 (two) times daily., Disp: 15 g, Rfl: 0 .  ondansetron (ZOFRAN) 4 MG tablet, Take 1 tablet (4 mg total) by mouth every 6 (six) hours as needed for nausea., Disp: 20 tablet, Rfl: 0 .  pantoprazole (PROTONIX) 40 MG tablet, Take 1 tablet (40 mg total) by mouth daily., Disp: 90 tablet, Rfl: 1 .  psyllium (METAMUCIL SMOOTH TEXTURE) 58.6 % powder, Take 1 packet by mouth at bedtime., Disp: , Rfl:    Medication list reviewed and updated.  Observations/Objective:  Patient reported her weight to be 152 pounds.  LFTs from 06/23/2020  Bilirubin 0.4, AP 135, AST 31, ALT 19, total protein 6.6 and albumin 4.1  Assessment and Plan:  #1.  GERD.  She is doing well with dietary measures and pantoprazole.  Will consider change in therapy at the time of next office visit in a year.  #2.  History of elevated transaminases.  She has undergone extensive work-up in the past except a liver biopsy.  Etiology felt to be fatty liver.  She had positive  ANA at a low titer felt to be nonspecific.  Transaminases were normal about 2 months ago.  No plans for further work-up unless transaminases rise again.  #3.  Burning on defecation.  Patient will try Metamucil 4 g by mouth daily at bedtime.  If this symptom persists she will call office.  She is up-to-date on screening for CRC.  Last colonoscopy was in August 2015 and next one due in August 2025.  Follow Up Instructions:    I discussed the assessment and treatment plan with the patient. The patient was provided an  opportunity to ask questions and all were answered. The patient agreed with the plan and demonstrated an understanding of the instructions.   The patient was advised to call back or seek an in-person evaluation if the symptoms worsen or if the condition fails to improve as anticipated.  I provided 12 minutes of non-face-to-face time during this encounter.   Lionel December, MD

## 2020-09-11 DIAGNOSIS — K219 Gastro-esophageal reflux disease without esophagitis: Secondary | ICD-10-CM | POA: Insufficient documentation

## 2020-10-08 IMAGING — DX DG WRIST COMPLETE 3+V*L*
3 series · 3 of 3 positions shown · non-contrast
Comparison: None.

CLINICAL DATA: Left wrist pain for 1 month

EXAM:
LEFT WRIST - COMPLETE 3+ VIEW

[wrist ap]
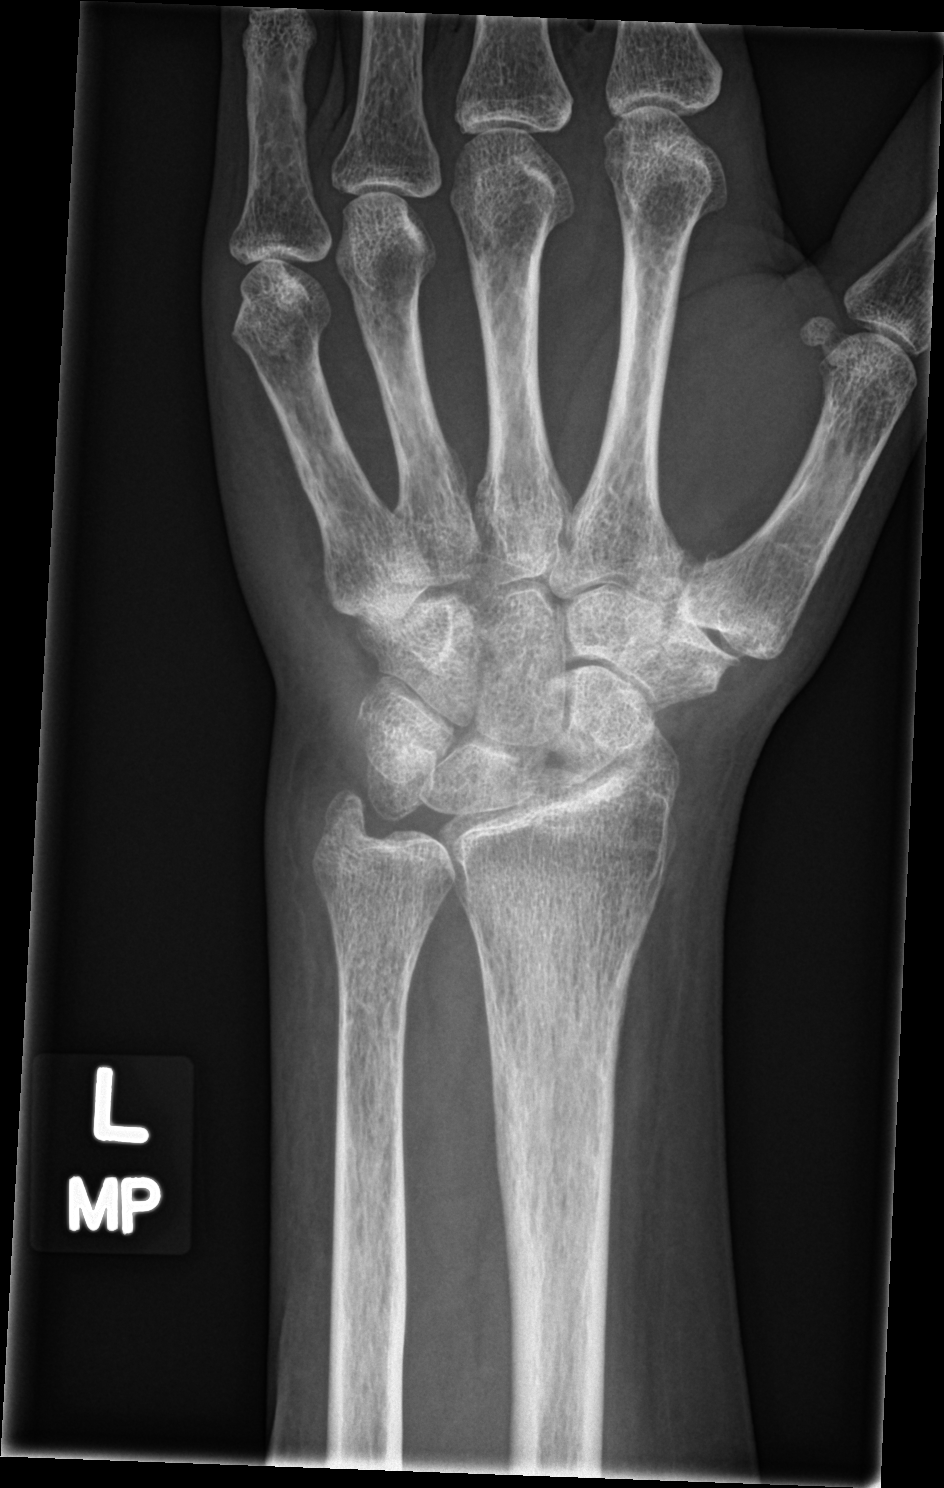

[wrist lat]
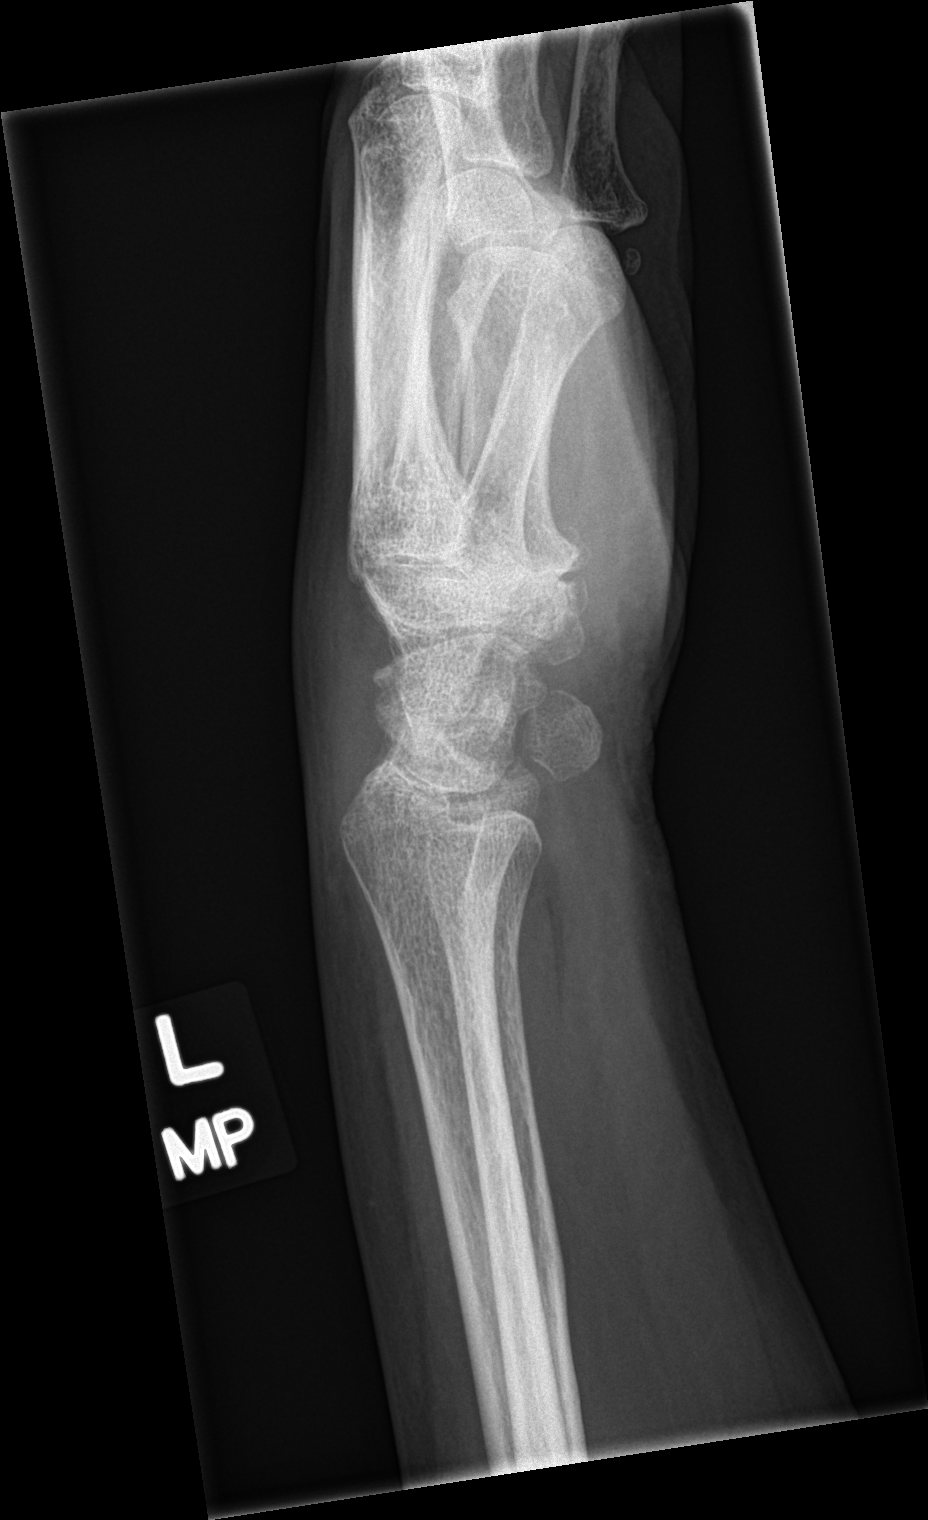

[wrist obl]
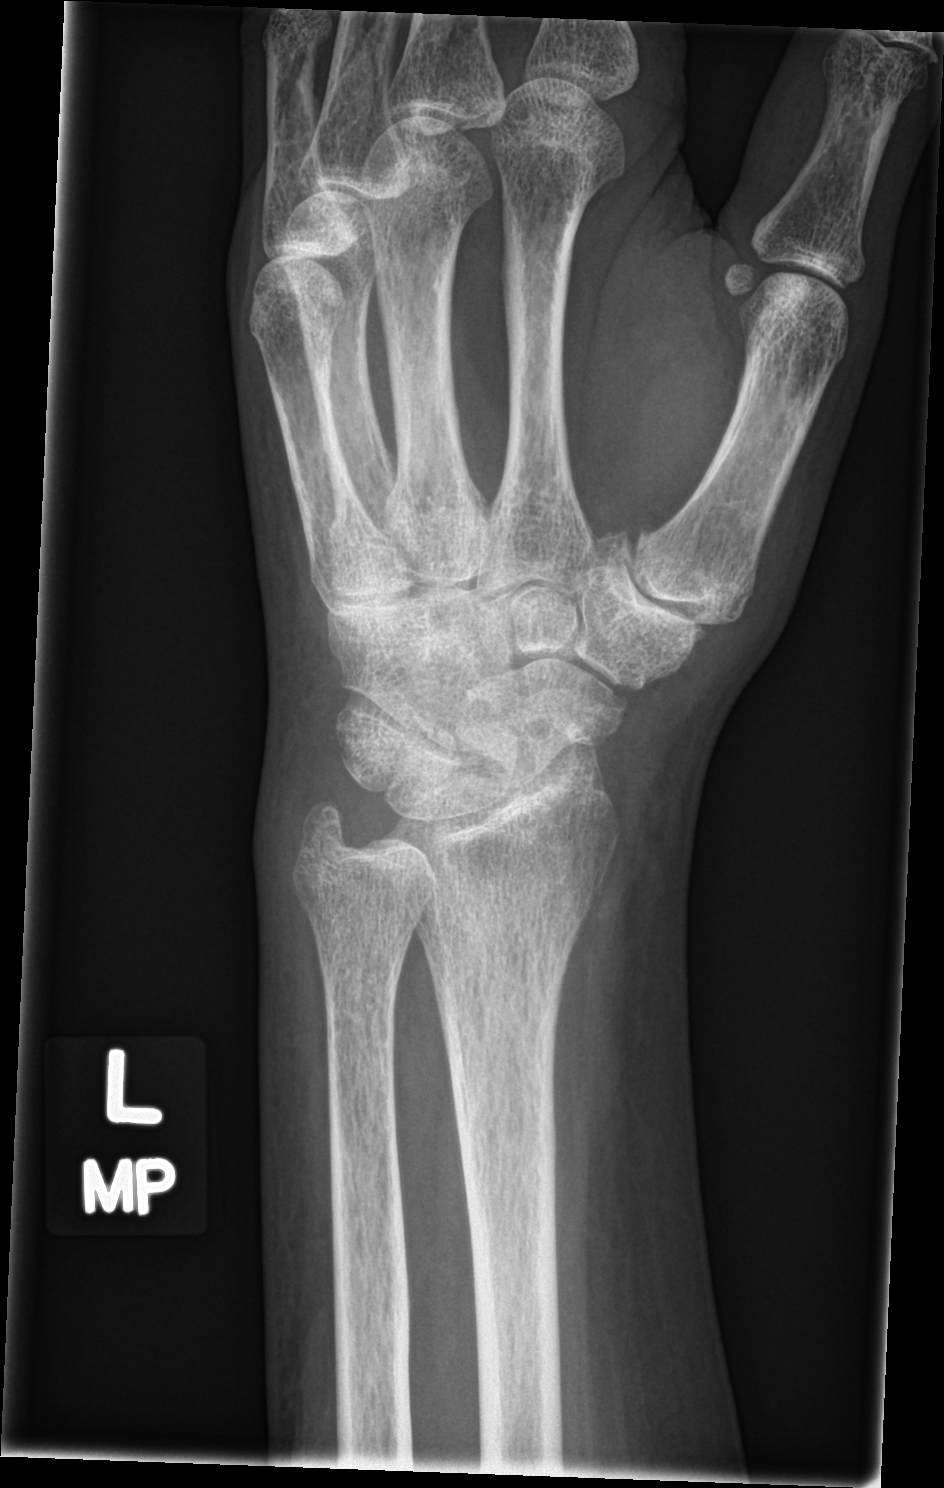

[3 of 3 positions shown; findings below may reference images not displayed]

FINDINGS: No acute fracture. No dislocation. Prominent rounded lucency within
the proximal pole of the scaphoid which may represent a intraosseous
cyst/ganglion versus erosion. There is mild radiocarpal joint space
narrowing. Mild arthropathy is also present at the first CMC and
triscaphe joints. Bones appear demineralized. Mild soft tissue
swelling over the dorsum of the wrist.
IMPRESSION: 1. Mild degenerative changes of the left wrist without acute
fracture or dislocation.
2. Prominent rounded lucency within the proximal pole of the
scaphoid may represent a intraosseous cyst/ganglion versus erosion.

## 2020-11-20 ENCOUNTER — Other Ambulatory Visit: Payer: Self-pay

## 2020-11-20 ENCOUNTER — Ambulatory Visit: Payer: BC Managed Care – PPO | Admitting: Nurse Practitioner

## 2020-11-20 ENCOUNTER — Encounter: Payer: Self-pay | Admitting: Nurse Practitioner

## 2020-11-20 VITALS — BP 132/79 | HR 96 | Temp 97.7°F | Resp 20 | Ht 69.0 in | Wt 168.0 lb

## 2020-11-20 DIAGNOSIS — R609 Edema, unspecified: Secondary | ICD-10-CM

## 2020-11-20 DIAGNOSIS — M10072 Idiopathic gout, left ankle and foot: Secondary | ICD-10-CM | POA: Diagnosis not present

## 2020-11-20 DIAGNOSIS — Z86718 Personal history of other venous thrombosis and embolism: Secondary | ICD-10-CM

## 2020-11-20 DIAGNOSIS — I10 Essential (primary) hypertension: Secondary | ICD-10-CM

## 2020-11-20 DIAGNOSIS — K219 Gastro-esophageal reflux disease without esophagitis: Secondary | ICD-10-CM

## 2020-11-20 DIAGNOSIS — R600 Localized edema: Secondary | ICD-10-CM

## 2020-11-20 DIAGNOSIS — E785 Hyperlipidemia, unspecified: Secondary | ICD-10-CM

## 2020-11-20 MED ORDER — ELIQUIS 5 MG PO TABS: 5.0000 mg | ORAL_TABLET | Freq: Two times a day (BID) | ORAL | 5 refills | Status: DC

## 2020-11-20 MED ORDER — AMLODIPINE BESYLATE 5 MG PO TABS: 5.0000 mg | ORAL_TABLET | Freq: Every day | ORAL | 1 refills | Status: DC

## 2020-11-20 NOTE — Progress Notes (Signed)
Subjective:    Patient ID: Christina [name], female    DOB: Oct 04, 1950, 70 y.o.   MRN: [id number]   Chief Complaint: Follow up for chronic disease management   HPI:  1. Primary hypertension On Amlodipine. Denies HA, swelling, vision changes, hypotention. Does avoids sodium.   2. Hyperlipidemia with target LDL less than 100 Manages with diet and exercise. Does not really avoid foods. Is not currently on a statin. Would consider starting one.   Lab Results  Component Value Date   CHOL 194 08/06/2018   HDL 63 08/06/2018   LDLCALC 110 (H) 08/06/2018   TRIG 103 08/06/2018   CHOLHDL 3.1 08/06/2018   The 10-year ASCVD risk score Mikey Bussing DC Jr., et al., 2013) is: 13.2%   Values used to calculate the score:     Age: 70 years     Sex: Female     Is Non-Hispanic African American: Yes     Diabetic: No     Tobacco smoker: No     Systolic Blood Pressure: 268 mmHg     Is BP treated: Yes     HDL Cholesterol: 63 mg/dL     Total Cholesterol: 194 mg/dL  3. Peripheral edema Sometimes has ankle edema. Denies pain. Does try and elevate when she is sitting. She does not wear her compression stockings.   4. Acute idiopathic gout involving toe of left foot No issues at this time.   5. Gastroesophageal reflux disease without esophagitis Is not taking pantoprazole. Avoids her triggers. Had epigastric hernia repair in July 2021.  6. History of deep vein thrombosis.  Taking eliquis. No bleeding complications. Denies leg pains, swelling, warmth.    Outpatient Encounter Medications as of 11/20/2020  Medication Sig  . acetaminophen (TYLENOL) 325 MG tablet Take 2 tablets (650 mg total) by mouth every 6 (six) hours as needed for mild pain (or Fever >/= 101).  Marland Kitchen amLODipine (NORVASC) 5 MG tablet Take 1 tablet (5 mg total) by mouth daily.  Marland Kitchen apixaban (ELIQUIS) 5 MG TABS tablet Take 1 tablet (5 mg total) by mouth 2 (two) times daily.  . diclofenac sodium (VOLTAREN) 1 % GEL Apply 2 g topically 3  (three) times daily as needed (knee pain).  . Elastic Bandages & Supports (V-2 HIGH COMPRESSION HOSE) MISC 1 each by Does not apply route daily.  . Multiple Vitamins-Minerals (MULTIVITAMIN GUMMIES WOMENS) CHEW Chew 1 each by mouth daily.  Marland Kitchen neomycin-bacitracin-polymyxin (NEOSPORIN) ointment Apply 1 application topically 2 (two) times daily.  . ondansetron (ZOFRAN) 4 MG tablet Take 1 tablet (4 mg total) by mouth every 6 (six) hours as needed for nausea.  . pantoprazole (PROTONIX) 40 MG tablet Take 1 tablet (40 mg total) by mouth daily.  . psyllium (METAMUCIL SMOOTH TEXTURE) 58.6 % powder Take 1 packet by mouth at bedtime.   No facility-administered encounter medications on file as of 11/20/2020.    Past Surgical History:  Procedure Laterality Date  . COLONOSCOPY  2015  . CYSTECTOMY     From shoulder and neck  . KNEE ARTHROSCOPY Left    Dr Veverly Fells  . SKIN GRAFT     ulcer on right leg   . TOTAL KNEE ARTHROPLASTY Right 08/23/2019   Procedure: TOTAL KNEE ARTHROPLASTY;  Surgeon: Netta Cedars, MD;  Location: [facility];  Service: Orthopedics;  Laterality: Right;  . TUBAL LIGATION     Multiple  . UMBILICAL HERNIA REPAIR N/A 01/27/2020   Procedure: EPIGASTRIC HERNIA REPAIR WITH MESH;  Surgeon: Curlene Labrum  C, MD;  Location: [facility];  Service: General;  Laterality: N/A;  epicastric site    Family History  Problem Relation Age of Onset  . Heart attack Mother   . Deep vein thrombosis Mother   . Diabetes Mother   . Heart disease Mother   . Hyperlipidemia Mother   . Cancer Father   . Hyperlipidemia Sister   . Deep vein thrombosis Daughter   . Other Neg Hx        Clotting disorder    New complaints: Gas pains. She notices them after she ingests cereal and chocolate candy. She will begin her metamucil per recommended by her GI provider.   Social history: Lives with husband  Controlled substance contract: N/A     Review of Systems  Constitutional: Negative for chills, fatigue and  fever.  Respiratory: Negative for cough, shortness of breath and wheezing.   Cardiovascular: Positive for leg swelling. Negative for chest pain and palpitations.  Gastrointestinal: Negative for abdominal pain, blood in stool, constipation and diarrhea.  Genitourinary: Negative for difficulty urinating, dysuria and hematuria.  Neurological: Negative for dizziness, light-headedness, numbness and headaches.  Psychiatric/Behavioral: Negative for confusion. The patient is not nervous/anxious.    Vitals:   11/20/20 1547  BP: 132/79  Pulse: 96  Resp: 20  Temp: 97.7 F (36.5 C)  SpO2: 100%      Objective:   Physical Exam Neck:     Vascular: No carotid bruit.  Cardiovascular:     Rate and Rhythm: Normal rate and regular rhythm.     Pulses: Normal pulses.     Heart sounds: Normal heart sounds.  Pulmonary:     Effort: Pulmonary effort is normal.     Breath sounds: Normal breath sounds.  Abdominal:     General: Abdomen is flat. Bowel sounds are normal.     Palpations: Abdomen is soft.  Musculoskeletal:        General: Normal range of motion.     Cervical back: Normal range of motion and neck supple.     Right lower leg: 1+ Edema present.     Left lower leg: 1+ Edema present.  Skin:    General: Skin is warm and dry.     Capillary Refill: Capillary refill takes less than 2 seconds.  Neurological:     Mental Status: She is alert and oriented to person, place, and time.  Psychiatric:        Mood and Affect: Mood normal.        Behavior: Behavior normal.        Assessment & Plan:  Christina [name] comes in today with chief complaint of Medical Management of Chronic Issues   Diagnosis and orders addressed:  1. Primary hypertension Continue medication as prescribed. Avoid sodium.   - amLODipine (NORVASC) 5 MG tablet; Take 1 tablet (5 mg total) by mouth daily.  Dispense: 90 tablet; Refill: 1 - CBC with Differential/Platelet - CMP14+EGFR  2. Hyperlipidemia with target LDL  less than 100 She is willing to try a statin for HLD management pending lab results.  - Lipid panel  3. Peripheral edema She is managing this well. She was encouraged to wear her compression stockings.   4. Acute idiopathic gout involving toe of left foot No issues with this at this time.   5. Gastroesophageal reflux disease without esophagitis Continue to manage with diet and following up with gastro.   6. History of DVT of lower extremity Continue apixaban.   -  apixaban (ELIQUIS) 5 MG TABS tablet; Take 1 tablet (5 mg total) by mouth 2 (two) times daily.  Dispense: 60 tablet; Refill: 5     Labs pending Health Maintenance reviewed Diet and exercise encouraged  Follow up plan: 6 months  Dollene Primrose, RN, BSN, FNP-Student  Mary-Margaret Hassell Done, FNP

## 2020-11-21 ENCOUNTER — Encounter: Payer: Self-pay | Admitting: Nurse Practitioner

## 2021-03-04 ENCOUNTER — Ambulatory Visit: Payer: BC Managed Care – PPO | Admitting: Nurse Practitioner

## 2021-03-04 ENCOUNTER — Other Ambulatory Visit: Payer: Self-pay

## 2021-03-04 ENCOUNTER — Encounter: Payer: Self-pay | Admitting: Nurse Practitioner

## 2021-03-04 VITALS — BP 146/84 | HR 84 | Temp 97.2°F | Resp 20 | Ht 69.0 in | Wt 184.0 lb

## 2021-03-04 DIAGNOSIS — L299 Pruritus, unspecified: Secondary | ICD-10-CM

## 2021-03-04 NOTE — Progress Notes (Signed)
Subjective:    Patient ID: Christina [name], female    DOB: [birth year], 70 y.o.   MRN: [id number]   Chief Complaint: Rash (On arms, feet, back , and bottom)   HPI States rash that began on her feet last December which she states is now on her legs, back, stomach, arms. States it is itchy and has been using cortisone for a "while" with relief. States she changed detergent when rash first began but has since changed back. Denies any food allergies. Denies any new medications but has started [employer] gummies around that time. Denies dry or cracking skin. Denies being outdoors. Recent travel to [place] to visit son.     Review of Systems  Constitutional: Negative.   HENT: Negative.    Eyes: Negative.   Respiratory: Negative.    Cardiovascular: Negative.  Negative for chest pain.  Gastrointestinal: Negative.   Endocrine: Negative.   Genitourinary: Negative.   Musculoskeletal: Negative.   Skin:  Positive for rash.       See HPI  Allergic/Immunologic: Negative.   Neurological: Negative.   Hematological: Negative.   Psychiatric/Behavioral: Negative.    All other systems reviewed and are negative.     Objective:   Physical Exam Vitals and nursing note reviewed.  Constitutional:      Appearance: Normal appearance.  Cardiovascular:     Rate and Rhythm: Normal rate and regular rhythm.     Pulses: Normal pulses.     Heart sounds: Normal heart sounds.  Pulmonary:     Effort: Pulmonary effort is normal.     Breath sounds: Normal breath sounds.  Abdominal:     General: Abdomen is flat.     Palpations: Abdomen is soft.  Musculoskeletal:        General: Normal range of motion.  Skin:    General: Skin is warm and dry.     Capillary Refill: Capillary refill takes less than 2 seconds.     Coloration: Skin is not jaundiced or pale.     Findings: No bruising, erythema or lesion.     Comments: No rash visible  Neurological:     General: No focal deficit present.     Mental  Status: She is alert and oriented to person, place, and time. Mental status is at baseline.  Psychiatric:        Mood and Affect: Mood normal.        Behavior: Behavior normal.        Thought Content: Thought content normal.        Judgment: Judgment normal.   BP (!) 146/84   Pulse 84   Temp (!) 97.2 F (36.2 C) (Temporal)   Resp 20   Ht 5\' 9"  (1.753 m)   Wt 184 lb (83.5 kg)   LMP  (LMP Unknown)   SpO2 98%   BMI 27.17 kg/m       Assessment & Plan:  [place] in today with chief complaint of Rash (On arms, feet, back , and bottom)   1. Pruritus Claritin OTC nightly Lotion after bathing while still wet Change back to mild detergent for clothes.    The above assessment and management plan was discussed with the patient. The patient verbalized understanding of and has agreed to the management plan. Patient is aware to call the clinic if symptoms persist or worsen. Patient is aware when to return to the clinic for a follow-up visit. Patient educated on when it is appropriate to go  to the emergency department.   Mary-Margaret Daphine Deutscher, FNP

## 2021-03-04 NOTE — Patient Instructions (Signed)
Pruritus Pruritus is an itchy feeling on the skin. One of the most common causes is dry skin, but many different things can cause itching. Most cases of itching do notrequire medical attention. Sometimes itchy skin can turn into a rash. Follow these instructions at home: Skin care  Apply moisturizing lotion to your skin as needed. Lotion that contains petroleum jelly is best. Take medicines or apply medicated creams only as told by your health care provider. This may include: Corticosteroid cream. Anti-itch lotions. Oral antihistamines. Apply a cool, wet cloth (cool compress) to the affected areas. Take baths with one of the following: Epsom salts. You can get these at your local pharmacy or grocery store. Follow the instructions on the packaging. Baking soda. Pour a small amount into the bath as told by your health care provider. Colloidal oatmeal. You can get this at your local pharmacy or grocery store. Follow the instructions on the packaging. Apply baking soda paste to your skin. To make the paste, stir water into a small amount of baking soda until it reaches a paste-like consistency. Do not scratch your skin. Do not take hot showers or baths, which can make itching worse. A cool shower may help with itching as long as you apply moisturizing lotion after the shower. Do not use scented soaps, detergents, perfumes, and cosmetic products. Instead, use gentle, unscented versions of these items.  General instructions Avoid wearing tight clothes. Keep a journal to help find out what is causing your itching. Write down: What you eat and drink. What cosmetic products you use. What soaps or detergents you use. What you wear, including jewelry. Use a humidifier. This keeps the air moist, which helps to prevent dry skin. Be aware of any changes in your itchiness. Contact a health care provider if: The itching does not go away after several days. You are unusually thirsty or urinating more  than normal. Your skin tingles or feels numb. Your skin or the white parts of your eyes turn yellow (jaundice). You feel weak. You have any of the following: Night sweats. Tiredness (fatigue). Weight loss. Abdominal pain. Summary Pruritus is an itchy feeling on the skin. One of the most common causes is dry skin, but many different conditions and factors can cause itching. Apply moisturizing lotion to your skin as needed. Lotion that contains petroleum jelly is best. Take medicines or apply medicated creams only as told by your health care provider. Do not take hot showers or baths. Do not use scented soaps, detergents, perfumes, or cosmetic products. This information is not intended to replace advice given to you by your health care provider. Make sure you discuss any questions you have with your healthcare provider. Document Revised: 08/15/2017 Document Reviewed: 08/15/2017 Elsevier Patient Education  2022 Elsevier Inc.  

## 2021-03-23 ENCOUNTER — Encounter: Payer: Self-pay | Admitting: Nurse Practitioner

## 2021-03-23 ENCOUNTER — Ambulatory Visit: Payer: BC Managed Care – PPO | Admitting: Nurse Practitioner

## 2021-03-23 ENCOUNTER — Other Ambulatory Visit: Payer: Self-pay

## 2021-03-23 VITALS — BP 143/82 | HR 65 | Temp 98.2°F | Resp 20 | Ht 69.0 in | Wt 189.0 lb

## 2021-03-23 DIAGNOSIS — L299 Pruritus, unspecified: Secondary | ICD-10-CM

## 2021-03-23 MED ORDER — HYDROXYZINE PAMOATE 25 MG PO CAPS: ORAL_CAPSULE | ORAL | 2 refills | Status: DC

## 2021-03-23 NOTE — Patient Instructions (Signed)
Pruritus Pruritus is an itchy feeling on the skin. One of the most common causes is dry skin, but many different things can cause itching. Most cases of itching do notrequire medical attention. Sometimes itchy skin can turn into a rash. Follow these instructions at home: Skin care  Apply moisturizing lotion to your skin as needed. Lotion that contains petroleum jelly is best. Take medicines or apply medicated creams only as told by your health care provider. This may include: Corticosteroid cream. Anti-itch lotions. Oral antihistamines. Apply a cool, wet cloth (cool compress) to the affected areas. Take baths with one of the following: Epsom salts. You can get these at your local pharmacy or grocery store. Follow the instructions on the packaging. Baking soda. Pour a small amount into the bath as told by your health care provider. Colloidal oatmeal. You can get this at your local pharmacy or grocery store. Follow the instructions on the packaging. Apply baking soda paste to your skin. To make the paste, stir water into a small amount of baking soda until it reaches a paste-like consistency. Do not scratch your skin. Do not take hot showers or baths, which can make itching worse. A cool shower may help with itching as long as you apply moisturizing lotion after the shower. Do not use scented soaps, detergents, perfumes, and cosmetic products. Instead, use gentle, unscented versions of these items.  General instructions Avoid wearing tight clothes. Keep a journal to help find out what is causing your itching. Write down: What you eat and drink. What cosmetic products you use. What soaps or detergents you use. What you wear, including jewelry. Use a humidifier. This keeps the air moist, which helps to prevent dry skin. Be aware of any changes in your itchiness. Contact a health care provider if: The itching does not go away after several days. You are unusually thirsty or urinating more  than normal. Your skin tingles or feels numb. Your skin or the white parts of your eyes turn yellow (jaundice). You feel weak. You have any of the following: Night sweats. Tiredness (fatigue). Weight loss. Abdominal pain. Summary Pruritus is an itchy feeling on the skin. One of the most common causes is dry skin, but many different conditions and factors can cause itching. Apply moisturizing lotion to your skin as needed. Lotion that contains petroleum jelly is best. Take medicines or apply medicated creams only as told by your health care provider. Do not take hot showers or baths. Do not use scented soaps, detergents, perfumes, or cosmetic products. This information is not intended to replace advice given to you by your health care provider. Make sure you discuss any questions you have with your healthcare provider. Document Revised: 08/15/2017 Document Reviewed: 08/15/2017 Elsevier Patient Education  2022 Elsevier Inc.  

## 2021-03-23 NOTE — Progress Notes (Signed)
   Subjective:    Patient ID: Christina [name], female    DOB: Jun 23, 1951, 70 y.o.   MRN: [id number]  Chief Complaint: Rash all over   HPI Patient come sin today c/o itching. On bil lower ext and arms. She is not sure what is causing itching. She scratches so hard she has bruises. She has nit been usimg any new detergents or soaps.    Review of Systems  Constitutional:  Negative for diaphoresis.  Eyes:  Negative for pain.  Respiratory:  Negative for shortness of breath.   Cardiovascular:  Negative for chest pain, palpitations and leg swelling.  Gastrointestinal:  Negative for abdominal pain.  Endocrine: Negative for polydipsia.  Skin:  Negative for rash.  Neurological:  Negative for dizziness, weakness and headaches.  Hematological:  Does not bruise/bleed easily.  All other systems reviewed and are negative.     Objective:   Physical Exam Vitals and nursing note reviewed.  Constitutional:      Appearance: Normal appearance.  Cardiovascular:     Rate and Rhythm: Normal rate and regular rhythm.     Heart sounds: Normal heart sounds.  Neurological:     Mental Status: She is alert.   BP (!) 143/82   Pulse 65   Temp 98.2 F (36.8 C) (Temporal)   Resp 20   Ht 5\' 9"  (1.753 m)   Wt 189 lb (85.7 kg)   LMP  (LMP Unknown)   SpO2 97%   BMI 27.91 kg/m          Assessment & Plan:  [place] in today with chief complaint of Rash all over   1. Pruritus Avoid scratching Will discuss labs when back - hydrOXYzine (VISTARIL) 25 MG capsule; 1/2-1 tablet daily  Dispense: 30 capsule; Refill: 2 - Alpha-Gal Panel    The above assessment and management plan was discussed with the patient. The patient verbalized understanding of and has agreed to the management plan. Patient is aware to call the clinic if symptoms persist or worsen. Patient is aware when to return to the clinic for a follow-up visit. Patient educated on when it is appropriate to go to the emergency  department.   Mary-Margaret Rosalee Kaufman, FNP

## 2021-03-26 LAB — ALPHA-GAL PANEL
Allergen Lamb IgE: <0.1 kU/L
Beef IgE: <0.1 kU/L
IgE (Immunoglobulin E), Serum: 44 IU/mL (ref 6–495)
O215-IgE Alpha-Gal: <0.1 kU/L
Pork IgE: <0.1 kU/L

## 2021-04-15 ENCOUNTER — Other Ambulatory Visit: Payer: Self-pay | Admitting: Nurse Practitioner

## 2021-04-15 DIAGNOSIS — L299 Pruritus, unspecified: Secondary | ICD-10-CM

## 2021-04-28 ENCOUNTER — Other Ambulatory Visit: Payer: Self-pay

## 2021-04-28 ENCOUNTER — Other Ambulatory Visit: Payer: Self-pay | Admitting: Family Medicine

## 2021-04-28 ENCOUNTER — Ambulatory Visit: Payer: BC Managed Care – PPO | Admitting: Family Medicine

## 2021-04-28 ENCOUNTER — Encounter: Payer: Self-pay | Admitting: Family Medicine

## 2021-04-28 VITALS — BP 141/73 | HR 86 | Temp 97.3°F | Ht 69.0 in | Wt 196.0 lb

## 2021-04-28 DIAGNOSIS — Z23 Encounter for immunization: Secondary | ICD-10-CM | POA: Diagnosis not present

## 2021-04-28 DIAGNOSIS — K644 Residual hemorrhoidal skin tags: Secondary | ICD-10-CM | POA: Diagnosis not present

## 2021-04-28 MED ORDER — HYDROCORTISONE ACETATE 25 MG RE SUPP: 25.0000 mg | Freq: Two times a day (BID) | RECTAL | 0 refills | Status: DC

## 2021-04-28 NOTE — Progress Notes (Signed)
Subjective:  Patient ID: Christina [name], female    DOB: 08/17/1950, 70 y.o.   MRN: [id number]  Patient Care Team: Bennie Pierini, FNP as PCP - General (Nurse Practitioner) Sinda Du, MD as Referring Physician (Surgery)   Chief Complaint:  Rectal Bleeding   HPI: Christina [name] is a 70 y.o. female presenting on 04/28/2021 for Rectal Bleeding   Pt presents today for the evaluation of rectal pain and bright red blood in stool. States yesterday after using the bathroom she noticed bright red blood in her stool and on the tissue paper after having a BM. States she had a little bit of rectal pain and notice something "funny feeling down there" when wiping. No other associated symptoms.  She would like to get her influenza vaccination today.    Relevant past medical, surgical, family, and social history reviewed and updated as indicated.  Allergies and medications reviewed and updated. Data reviewed: Chart in Epic.   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   Procedure: TOTAL KNEE ARTHROPLASTY;  Surgeon: Beverely Low, MD;  Location: [facility];  Service: Orthopedics;  Laterality: Right;   TUBAL LIGATION     Multiple   UMBILICAL HERNIA REPAIR N/A 01/27/2020   Procedure: EPIGASTRIC HERNIA REPAIR WITH MESH;  Surgeon: Lucretia Roers, MD;  Location: [facility];  Service: General;  Laterality: N/A;  epicastric  site    Social History   Socioeconomic History   Marital status: Married    Spouse name: Not on file   Number of children: 5   Years of education: Not on file   Highest education level: Not on file  Occupational History   Not on file  Tobacco Use   Smoking status: Former    Types: Cigarettes    Quit date: 08/16/1979    Years since quitting: 41.7   Smokeless tobacco: Never   Tobacco comments:    x33 years  Vaping Use   Vaping Use: Never used  Substance and Sexual Activity   Alcohol use: No    Alcohol/week: 0.0 standard drinks   Drug use: No   Sexual activity: Not on file  Other Topics Concern   Not on file  Social History Narrative   Married.   Social Determinants of Health   Financial Resource Strain: Not on file  Food Insecurity: Not on file  Transportation Needs: Not on file  Physical Activity: Not on file  Stress: Not on file  Social Connections: Not on file  Intimate Partner Violence: Not on file    Outpatient Encounter Medications as of 04/28/2021  Medication Sig   acetaminophen (TYLENOL) 325 MG tablet Take 2 tablets (650 mg total) by mouth every 6 (six) hours as needed for mild pain (or  Fever >/= 101).   amLODipine (NORVASC) 5 MG tablet Take 1 tablet (5 mg total) by mouth daily.   apixaban (ELIQUIS) 5 MG TABS tablet Take 1 tablet (5 mg total) by mouth 2 (two) times daily.   diclofenac sodium (VOLTAREN) 1 % GEL Apply 2 g topically 3 (three) times daily as needed (knee pain).   Elastic Bandages & Supports (V-2 HIGH COMPRESSION HOSE) MISC 1 each by Does not apply route daily.   hydrocortisone (ANUSOL-HC) 25 MG suppository Place 1 suppository (25 mg total) rectally 2 (two) times daily.   hydrOXYzine (VISTARIL) 25 MG capsule TAKE 1/2-1 TABLET BY MOUTH DAILY   Multiple Vitamins-Minerals (MULTIVITAMIN GUMMIES WOMENS) CHEW Chew 1 each by mouth daily.   neomycin-bacitracin-polymyxin (NEOSPORIN) ointment Apply 1 application topically 2 (two) times daily.   psyllium  (METAMUCIL SMOOTH TEXTURE) 58.6 % powder Take 1 packet by mouth at bedtime. (Patient not taking: Reported on 04/28/2021)   No facility-administered encounter medications on file as of 04/28/2021.    Allergies  Allergen Reactions   Ace Inhibitors Cough         Review of Systems  Constitutional:  Negative for activity change, appetite change, chills, diaphoresis, fatigue, fever and unexpected weight change.  HENT: Negative.    Eyes: Negative.   Respiratory:  Negative for cough, chest tightness and shortness of breath.   Cardiovascular:  Negative for chest pain, palpitations and leg swelling.  Gastrointestinal:  Positive for anal bleeding, blood in stool and rectal pain. Negative for abdominal distention, abdominal pain, constipation, diarrhea, nausea and vomiting.  Endocrine: Negative.   Genitourinary:  Negative for decreased urine volume, difficulty urinating, dysuria, frequency and urgency.  Musculoskeletal:  Negative for arthralgias and myalgias.  Skin: Negative.   Allergic/Immunologic: Negative.   Neurological:  Negative for dizziness, tremors, seizures, syncope, facial asymmetry, speech difficulty, weakness, light-headedness, numbness and headaches.  Hematological: Negative.   Psychiatric/Behavioral:  Negative for confusion, hallucinations, sleep disturbance and suicidal ideas.   All other systems reviewed and are negative.      Objective:  BP (!) 141/73   Pulse 86   Temp (!) 97.3 F (36.3 C)   Ht 5\' 9"  (1.753 m)   Wt 196 lb (88.9 kg)   LMP  (LMP Unknown)   SpO2 97%   BMI 28.94 kg/m    Wt Readings from Last 3 Encounters:  04/28/21 196 lb (88.9 kg)  03/23/21 189 lb (85.7 kg)  03/04/21 184 lb (83.5 kg)    Physical Exam Vitals and nursing note reviewed.  Constitutional:      General: She is not in acute distress.    Appearance: Normal appearance. She is well-developed, well-groomed and overweight. She is not ill-appearing, toxic-appearing or diaphoretic.  HENT:      Head: Normocephalic and atraumatic.     Jaw: There is normal jaw occlusion.     Right Ear: Hearing normal.     Left Ear: Hearing normal.     Nose: Nose normal.     Mouth/Throat:     Lips: Pink.     Mouth: Mucous membranes are moist.     Pharynx: Oropharynx is clear. Uvula midline.  Eyes:     General: Lids are normal.     Extraocular Movements: Extraocular movements intact.     Conjunctiva/sclera: Conjunctivae normal.     Pupils: Pupils are equal, round, and reactive to light.  Neck:     Thyroid: No thyroid mass, thyromegaly or thyroid tenderness.     Vascular: No carotid bruit  or JVD.     Trachea: Trachea and phonation normal.  Cardiovascular:     Rate and Rhythm: Regular rhythm.     Chest Wall: PMI is not displaced.     Pulses: Normal pulses.     Heart sounds: Normal heart sounds. No murmur heard.   No friction rub. No gallop.  Pulmonary:     Effort: Pulmonary effort is normal. No respiratory distress.     Breath sounds: Normal breath sounds. No wheezing.  Abdominal:     General: Bowel sounds are normal. There is no distension or abdominal bruit.     Palpations: Abdomen is soft. There is no hepatomegaly, splenomegaly or mass.     Tenderness: There is no abdominal tenderness. There is no right CVA tenderness, left CVA tenderness, guarding or rebound.     Hernia: No hernia is present.  Genitourinary:    Rectum: Guaiac result negative. Tenderness and external hemorrhoid present. No mass, anal fissure or internal hemorrhoid. Normal anal tone.  Musculoskeletal:        General: Normal range of motion.     Cervical back: Normal range of motion and neck supple.     Right lower leg: No edema.     Left lower leg: No edema.  Lymphadenopathy:     Cervical: No cervical adenopathy.  Skin:    General: Skin is warm and dry.     Capillary Refill: Capillary refill takes less than 2 seconds.     Coloration: Skin is not cyanotic, jaundiced or pale.     Findings: No rash.  Neurological:      General: No focal deficit present.     Mental Status: She is alert and oriented to person, place, and time.     Cranial Nerves: Cranial nerves are intact.     Sensory: Sensation is intact.     Motor: Motor function is intact.     Coordination: Coordination is intact.     Gait: Gait is intact.     Deep Tendon Reflexes: Reflexes are normal and symmetric.  Psychiatric:        Attention and Perception: Attention and perception normal.        Mood and Affect: Mood and affect normal.        Speech: Speech normal.        Behavior: Behavior normal. Behavior is cooperative.        Thought Content: Thought content normal.        Cognition and Memory: Cognition and memory normal.        Judgment: Judgment normal.    Results for orders placed or performed in visit on 03/23/21  Alpha-Gal Panel  Result Value Ref Range   Class Description Allergens Comment    IgE (Immunoglobulin E), Serum 44 6 - 495 IU/mL   [id number] Alpha-Gal <0.10 Class 0 kU/L   Beef IgE <0.10 Class 0 kU/L   Pork IgE <0.10 Class 0 kU/L   Allergen Lamb IgE <0.10 Class 0 kU/L       Pertinent labs & imaging results that were available during my care of the patient were reviewed by me and considered in my medical decision making.  Assessment & Plan:  Tanara was seen today for rectal bleeding.  Diagnoses and all orders for this visit:  External hemorrhoid Hemorrhoid noted, not thrombosed. Will treat with below. Symptomatic care discussed in detail. Adequate fiber and water intake to prevent hard stools and straining. Report any new, worsening, or persistent symptoms.  -  hydrocortisone (ANUSOL-HC) 25 MG suppository; Place 1 suppository (25 mg total) rectally 2 (two) times daily.  Need for immunization against influenza -     Flu Vaccine QUAD High Dose(Fluad)    Continue all other maintenance medications.  Follow up plan: Return if symptoms worsen or fail to improve.   Continue healthy lifestyle choices, including  diet (rich in fruits, vegetables, and lean proteins, and low in salt and simple carbohydrates) and exercise (at least 30 minutes of moderate physical activity daily).  Educational handout given for hemorrhoids  The above assessment and management plan was discussed with the patient. The patient verbalized understanding of and has agreed to the management plan. Patient is aware to call the clinic if they develop any new symptoms or if symptoms persist or worsen. Patient is aware when to return to the clinic for a follow-up visit. Patient educated on when it is appropriate to go to the emergency department.   Kari Baars, FNP-C [facility] [phone]

## 2021-04-29 ENCOUNTER — Other Ambulatory Visit: Payer: Self-pay | Admitting: Nurse Practitioner

## 2021-04-29 DIAGNOSIS — K644 Residual hemorrhoidal skin tags: Secondary | ICD-10-CM

## 2021-04-29 MED ORDER — HYDROCORT-PRAMOXINE (PERIANAL) 1-1 % EX FOAM: 1.0000 | Freq: Two times a day (BID) | CUTANEOUS | 2 refills | Status: DC

## 2021-04-29 NOTE — Progress Notes (Unsigned)
Changed prescription to proctofoam Needs to take stool softner to prevent straining when having bowel movement Force fluids Increase fiber in diet

## 2021-04-30 NOTE — Progress Notes (Signed)
Patient notified

## 2021-05-21 ENCOUNTER — Other Ambulatory Visit: Payer: Self-pay | Admitting: Nurse Practitioner

## 2021-05-21 DIAGNOSIS — I10 Essential (primary) hypertension: Secondary | ICD-10-CM

## 2021-05-31 ENCOUNTER — Ambulatory Visit: Payer: Self-pay | Admitting: Nurse Practitioner

## 2021-07-09 ENCOUNTER — Other Ambulatory Visit: Payer: Self-pay | Admitting: Nurse Practitioner

## 2021-07-09 DIAGNOSIS — L299 Pruritus, unspecified: Secondary | ICD-10-CM

## 2021-07-13 ENCOUNTER — Other Ambulatory Visit: Payer: Self-pay

## 2021-07-13 ENCOUNTER — Ambulatory Visit: Payer: BC Managed Care – PPO | Admitting: Nurse Practitioner

## 2021-07-13 ENCOUNTER — Encounter: Payer: Self-pay | Admitting: Nurse Practitioner

## 2021-07-13 VITALS — BP 135/78 | HR 84 | Temp 98.3°F | Resp 20 | Ht 69.0 in | Wt 200.0 lb

## 2021-07-13 DIAGNOSIS — R1319 Other dysphagia: Secondary | ICD-10-CM

## 2021-07-13 DIAGNOSIS — L819 Disorder of pigmentation, unspecified: Secondary | ICD-10-CM

## 2021-07-13 DIAGNOSIS — R609 Edema, unspecified: Secondary | ICD-10-CM | POA: Diagnosis not present

## 2021-07-13 DIAGNOSIS — E785 Hyperlipidemia, unspecified: Secondary | ICD-10-CM | POA: Diagnosis not present

## 2021-07-13 DIAGNOSIS — K219 Gastro-esophageal reflux disease without esophagitis: Secondary | ICD-10-CM | POA: Diagnosis not present

## 2021-07-13 DIAGNOSIS — Z86718 Personal history of other venous thrombosis and embolism: Secondary | ICD-10-CM

## 2021-07-13 DIAGNOSIS — I10 Essential (primary) hypertension: Secondary | ICD-10-CM

## 2021-07-13 MED ORDER — APIXABAN 5 MG PO TABS: 5.0000 mg | ORAL_TABLET | Freq: Two times a day (BID) | ORAL | 5 refills | Status: DC

## 2021-07-13 MED ORDER — AMLODIPINE BESYLATE 5 MG PO TABS: 5.0000 mg | ORAL_TABLET | Freq: Every day | ORAL | 1 refills | Status: DC

## 2021-07-13 NOTE — Patient Instructions (Signed)
Cooking With Less Salt Cooking with less salt is one way to reduce the amount of sodium you get from food. Sodium is one of the elements that make up salt. It is found naturally in foods and is also added to certain foods. Depending on your condition and overall health, your health care provider or dietitian may recommend that you reduce your sodium intake. Most people should have less than 2,300 milligrams (mg) of sodium each day. If you have high blood pressure (hypertension), you may need to limit your sodium to 1,500 mg each day. Follow the tipsbelow to help reduce your sodium intake. What are tips for eating less sodium? Reading food labels  Check the food label before buying or using packaged ingredients. Always check the label for the serving size and sodium content. Look for products with no more than 140 mg of sodium in one serving. Check the % Daily Value column to see what percent of the daily recommended amount of sodium is provided in one serving of the product. Foods with 5% or less in this column are considered low in sodium. Foods with 20% or higher are considered high in sodium. Do not choose foods with salt as one of the first three ingredients on the ingredients list. If salt is one of the first three ingredients, it usually means the item is high in sodium.  Shopping Buy sodium-free or low-sodium products. Look for the following words on food labels: Low-sodium. Sodium-free. Reduced-sodium. No salt added. Unsalted. Always check the sodium content even if foods are labeled as low-sodium or no salt added. Buy fresh foods. Cooking Use herbs, seasonings without salt, and spices as substitutes for salt. Use sodium-free baking soda when baking. Grill, braise, or roast foods to add flavor with less salt. Avoid adding salt to pasta, rice, or hot cereals. Drain and rinse canned vegetables, beans, and meat before use. Avoid adding salt when cooking sweets and desserts. Cook with  low-sodium ingredients. What foods are high in sodium? Vegetables Regular canned vegetables (not low-sodium or reduced-sodium). Sauerkraut, pickled vegetables, and relishes. Olives. French fries. Onion rings. Regular canned tomato sauce and paste. Regular tomato and vegetable juice. Frozenvegetables in sauces. Grains Instant hot cereals. Bread stuffing, pancake, and biscuit mixes. Croutons. Seasoned rice or pasta mixes. Noodle soup cups. Boxed or frozen macaroni and cheese. Regular salted crackers. Self-rising flour. Rolls. Bagels. Flourtortillas and wraps. Meats and other proteins Meat or fish that is salted, canned, smoked, cured, spiced, or pickled. This includes bacon, ham, sausages, hot dogs, corned beef, chipped beef, meat loaves, salt pork, jerky, pickled herring, anchovies, regular canned tuna, andsardines. Salted nuts. Dairy Processed cheese and cheese spreads. Cheese curds. Blue cheese. Feta cheese.String cheese. Regular cottage cheese. Buttermilk. Canned milk. The items listed above may not be a complete list of foods high in sodium. Actual amounts of sodium may be different depending on processing. Contact a dietitian for more information. What foods are low in sodium? Fruits Fresh, frozen, or canned fruit with no sauce added. Fruit juice. Vegetables Fresh or frozen vegetables with no sauce added. "No salt added" canned vegetables. "No salt added" tomato sauce and paste. Low-sodium orreduced-sodium tomato and vegetable juice. Grains Noodles, pasta, quinoa, rice. Shredded or puffed wheat or puffed rice. Regular or quick oats (not instant). Low-sodium crackers. Low-sodium bread. Whole-grainbread and whole-grain pasta. Unsalted popcorn. Meats and other proteins Fresh or frozen whole meats, poultry (not injected with sodium), and fish with no sauce added. Unsalted nuts. Dried peas, beans, and   lentils without added salt. Unsalted canned beans. Eggs. Unsalted nut butters. Low-sodium canned  tunaor chicken. Dairy Milk. Soy milk. Yogurt. Low-sodium cheeses, such as Swiss, Monterey Jack, mozzarella, and ricotta. Sherbet or ice cream (keep to  cup per serving).Cream cheese. Fats and oils Unsalted butter or margarine. Other foods Homemade pudding. Sodium-free baking soda and baking powder. Herbs and spices.Low-sodium seasoning mixes. Beverages Coffee and tea. Carbonated beverages. The items listed above may not be a complete list of foods low in sodium. Actual amounts of sodium may be different depending on processing. Contact a dietitian for more information. What are some salt alternatives when cooking? The following are herbs, seasonings, and spices that can be used instead of salt to flavor your food. Herbs should be fresh or dried. Do not choose packaged mixes. Next to the name of the herb, spice, or seasoning aresome examples of foods you can pair it with. Herbs Bay leaves - Soups, meat and vegetable dishes, and spaghetti sauce. Basil - Italian dishes, soups, pasta, and fish dishes. Cilantro - Meat, poultry, and vegetable dishes. Chili powder - Marinades and Mexican dishes. Chives - Salad dressings and potato dishes. Cumin - Mexican dishes, couscous, and meat dishes. Dill - Fish dishes, sauces, and salads. Fennel - Meat and vegetable dishes, breads, and cookies. Garlic (do not use garlic salt) - Italian dishes, meat dishes, salad dressings, and sauces. Marjoram - Soups, potato dishes, and meat dishes. Oregano - Pizza and spaghetti sauce. Parsley - Salads, soups, pasta, and meat dishes. Rosemary - Italian dishes, salad dressings, soups, and red meats. Saffron - Fish dishes, pasta, and some poultry dishes. Sage - Stuffings and sauces. Tarragon - Fish and poultry dishes. Thyme - Stuffing, meat, and fish dishes. Seasonings Lemon juice - Fish dishes, poultry dishes, vegetables, and salads. Vinegar - Salad dressings, vegetables, and fish dishes. Spices Cinnamon - Sweet  dishes, such as cakes, cookies, and puddings. Cloves - Gingerbread, puddings, and marinades for meats. Curry - Vegetable dishes, fish and poultry dishes, and stir-fry dishes. Ginger - Vegetable dishes, fish dishes, and stir-fry dishes. Nutmeg - Pasta, vegetables, poultry, fish dishes, and custard. Summary Cooking with less salt is one way to reduce the amount of sodium that you get from food. Buy sodium-free or low-sodium products. Check the food label before using or buying packaged ingredients. Use herbs, seasonings without salt, and spices as substitutes for salt in foods. This information is not intended to replace advice given to you by your health care provider. Make sure you discuss any questions you have with your healthcare provider. Document Revised: 07/24/2019 Document Reviewed: 07/24/2019 Elsevier Patient Education  2022 Elsevier Inc.  

## 2021-07-13 NOTE — Progress Notes (Signed)
Subjective:    Patient ID: Christina [name], female    DOB: [birth year], 70 y.o.   MRN: [id number]  Chief Complaint: Medical Management of Chronic Issues (Difficulty swallowing/)    HPI:  1. Essential hypertension, benign No c/o chest pain, sob or headache. Does not check blood pressure at home. BP Readings from Last 3 Encounters:  07/13/21 135/78  04/28/21 (!) 141/73  03/23/21 (!) 143/82     2. Hyperlipidemia with target LDL less than 100 Doe swatch diet but does very little exercise. Lab Results  Component Value Date   CHOL 194 08/06/2018   HDL 63 08/06/2018   LDLCALC 110 (H) 08/06/2018   TRIG 103 08/06/2018   CHOLHDL 3.1 08/06/2018     3. Peripheral edema Has occasional edema  4. Gastroesophageal reflux disease, unspecified whether esophagitis present She is having trouble swallowing her food. She is not able to swallow a lot of meats. She has no heartburn    Outpatient Encounter Medications as of 07/13/2021  Medication Sig   acetaminophen (TYLENOL) 325 MG tablet Take 2 tablets (650 mg total) by mouth every 6 (six) hours as needed for mild pain (or Fever >/= 101).   amLODipine (NORVASC) 5 MG tablet TAKE 1 TABLET (5 MG TOTAL) BY MOUTH DAILY.   apixaban (ELIQUIS) 5 MG TABS tablet Take 1 tablet (5 mg total) by mouth 2 (two) times daily.   diclofenac sodium (VOLTAREN) 1 % GEL Apply 2 g topically 3 (three) times daily as needed (knee pain).   Elastic Bandages & Supports (V-2 HIGH COMPRESSION HOSE) MISC 1 each by Does not apply route daily.   hydrocortisone (ANUSOL-HC) 25 MG suppository PLACE 1 SUPPOSITORY RECTALLY 2 TIMES DAILY.   hydrocortisone-pramoxine (PROCTOFOAM-HC) rectal foam Place 1 applicator rectally 2 (two) times daily.   Multiple Vitamins-Minerals (MULTIVITAMIN GUMMIES WOMENS) CHEW Chew 1 each by mouth daily.   neomycin-bacitracin-polymyxin (NEOSPORIN) ointment Apply 1 application topically 2 (two) times daily.   hydrOXYzine (VISTARIL) 25 MG capsule  TAKE 1/2 TO 1 TABLET BY MOUTH DAILY (Patient not taking: Reported on 07/13/2021)   [DISCONTINUED] psyllium (METAMUCIL SMOOTH TEXTURE) 58.6 % powder Take 1 packet by mouth at bedtime. (Patient not taking: Reported on 07/13/2021)   No facility-administered encounter medications on file as of 07/13/2021.    Past Surgical History:  Procedure Laterality Date   COLONOSCOPY  2015   CYSTECTOMY     From shoulder and neck   KNEE ARTHROSCOPY Left    Dr Veverly Fells   SKIN GRAFT     ulcer on right leg    TOTAL KNEE ARTHROPLASTY Right 08/23/2019   Procedure: TOTAL KNEE ARTHROPLASTY;  Surgeon: Netta Cedars, MD;  Location: [facility];  Service: Orthopedics;  Laterality: Right;   TUBAL LIGATION     Multiple   UMBILICAL HERNIA REPAIR N/A 01/27/2020   Procedure: EPIGASTRIC HERNIA REPAIR WITH MESH;  Surgeon: Virl Cagey, MD;  Location: [facility];  Service: General;  Laterality: N/A;  epicastric site    Family History  Problem Relation Age of Onset   Heart attack Mother    Deep vein thrombosis Mother    Diabetes Mother    Heart disease Mother    Hyperlipidemia Mother    Cancer Father    Hyperlipidemia Sister    Deep vein thrombosis Daughter    Other Neg Hx        Clotting disorder    New complaints: None today  Social history: Lives with her husband. Works for school system  Controlled substance contract: n/a     Review of Systems     Objective:   Physical Exam Vitals and nursing note reviewed.  Constitutional:      General: She is not in acute distress.    Appearance: Normal appearance. She is well-developed.  HENT:     Head: Normocephalic.     Right Ear: Tympanic membrane normal.     Left Ear: Tympanic membrane normal.     Nose: Nose normal.     Mouth/Throat:     Mouth: Mucous membranes are moist.  Eyes:     Pupils: Pupils are equal, round, and reactive to light.  Neck:     Vascular: No carotid bruit or JVD.  Cardiovascular:     Rate and Rhythm: Normal rate and regular  rhythm.     Pulses:          Dorsalis pedis pulses are 2+ on the right side and 2+ on the left side.       Posterior tibial pulses are 2+ on the right side and 2+ on the left side.     Heart sounds: Normal heart sounds.  Pulmonary:     Effort: Pulmonary effort is normal. No respiratory distress.     Breath sounds: Normal breath sounds. No wheezing or rales.  Chest:     Chest wall: No tenderness.  Abdominal:     General: Bowel sounds are normal. There is no distension or abdominal bruit.     Palpations: Abdomen is soft. There is no hepatomegaly, splenomegaly, mass or pulsatile mass.     Tenderness: There is no abdominal tenderness.  Musculoskeletal:        General: Normal range of motion.     Cervical back: Normal range of motion and neck supple.  Lymphadenopathy:     Cervical: No cervical adenopathy.  Skin:    General: Skin is warm and dry.     Comments: Skin discoloration bil lower ext- macular rash  Neurological:     Mental Status: She is alert and oriented to person, place, and time.     Deep Tendon Reflexes: Reflexes are normal and symmetric.  Psychiatric:        Behavior: Behavior normal.        Thought Content: Thought content normal.        Judgment: Judgment normal.    BP 135/78   Pulse 84   Temp 98.3 F (36.8 C) (Temporal)   Resp 20   Ht '5\' 9"'  (1.753 m)   Wt 200 lb (90.7 kg)   LMP  (LMP Unknown)   SpO2 99%   BMI 29.53 kg/m         Assessment & Plan:  Avalynne Diver comes in today with chief complaint of Medical Management of Chronic Issues (Difficulty swallowing/)   Diagnosis and orders addressed:  1. Primary hypertension - amLODipine (NORVASC) 5 MG tablet; Take 1 tablet (5 mg total) by mouth daily.  Dispense: 90 tablet; Refill: 1 Low sodium diet - CBC with Differential/Platelet - CMP14+EGFR  2. Hyperlipidemia with target LDL less than 100 Low fat diet - Lipid panel  3. Peripheral edema Elevate legs when sitting Continue  to wear  compression socks  4. Gastroesophageal reflux disease, unspecified whether esophagitis present Avoid spicy foods Do not eat 2 hours prior to bedtime  5. Esophageal dysphagia Chew food well - Ambulatory referral to Gastroenterology  6. discoloration of skin of lower leg  - Ambulatory referral to Dermatology  7. History  of DVT of lower extremity - apixaban (ELIQUIS) 5 MG TABS tablet; Take 1 tablet (5 mg total) by mouth 2 (two) times daily.  Dispense: 60 tablet; Refill: 5    Labs pending Health Maintenance reviewed Diet and exercise encouraged  Follow up plan: 6 months   Mary-Margaret Hassell Done, FNP

## 2021-07-14 LAB — CBC WITH DIFFERENTIAL/PLATELET
Basophils Absolute: 0 10*3/uL (ref 0.0–0.2)
Basos: 1 %
EOS (ABSOLUTE): 0.2 10*3/uL (ref 0.0–0.4)
Eos: 2 %
Hematocrit: 39.3 % (ref 34.0–46.6)
Hemoglobin: 12.6 g/dL (ref 11.1–15.9)
Immature Grans (Abs): 0 10*3/uL (ref 0.0–0.1)
Immature Granulocytes: 0 %
Lymphocytes Absolute: 2 10*3/uL (ref 0.7–3.1)
Lymphs: 31 %
MCH: 25.3 pg — ABNORMAL LOW (ref 26.6–33.0)
MCHC: 32.1 g/dL (ref 31.5–35.7)
MCV: 79 fL (ref 79–97)
Monocytes Absolute: 0.5 10*3/uL (ref 0.1–0.9)
Monocytes: 8 %
Neutrophils Absolute: 3.8 10*3/uL (ref 1.4–7.0)
Neutrophils: 58 %
Platelets: 264 10*3/uL (ref 150–450)
RBC: 4.98 x10E6/uL (ref 3.77–5.28)
RDW: 12.9 % (ref 11.7–15.4)
WBC: 6.6 10*3/uL (ref 3.4–10.8)

## 2021-07-14 LAB — CMP14+EGFR
ALT: 17 IU/L (ref 0–32)
AST: 28 IU/L (ref 0–40)
Albumin/Globulin Ratio: 1.9 (ref 1.2–2.2)
Albumin: 4.5 g/dL (ref 3.7–4.7)
Alkaline Phosphatase: 129 IU/L — ABNORMAL HIGH (ref 44–121)
BUN/Creatinine Ratio: 16 (ref 12–28)
BUN: 17 mg/dL (ref 8–27)
Bilirubin Total: 0.2 mg/dL (ref 0.0–1.2)
CO2: 24 mmol/L (ref 20–29)
Calcium: 10.4 mg/dL — ABNORMAL HIGH (ref 8.7–10.3)
Chloride: 105 mmol/L (ref 96–106)
Creatinine, Ser: 1.07 mg/dL — ABNORMAL HIGH (ref 0.57–1.00)
Globulin, Total: 2.4 g/dL (ref 1.5–4.5)
Glucose: 94 mg/dL (ref 70–99)
Potassium: 4.1 mmol/L (ref 3.5–5.2)
Sodium: 143 mmol/L (ref 134–144)
Total Protein: 6.9 g/dL (ref 6.0–8.5)
eGFR: 55 mL/min/{1.73_m2} — ABNORMAL LOW (ref >59)

## 2021-07-14 LAB — LIPID PANEL
Chol/HDL Ratio: 3.1 ratio (ref 0.0–4.4)
Cholesterol, Total: 238 mg/dL — ABNORMAL HIGH (ref 100–199)
HDL: 76 mg/dL (ref >39)
LDL Chol Calc (NIH): 147 mg/dL — ABNORMAL HIGH (ref 0–99)
Triglycerides: 88 mg/dL (ref 0–149)
VLDL Cholesterol Cal: 15 mg/dL (ref 5–40)

## 2021-07-27 ENCOUNTER — Telehealth: Payer: Self-pay | Admitting: Dermatology

## 2021-07-27 NOTE — Telephone Encounter (Signed)
Referral attached to appointment

## 2021-07-27 NOTE — Telephone Encounter (Signed)
Patient is calling for a referral appointment from Bennie Pierini, FNP.  Patient is scheduled for 02/07/2022 at 2:30 with Janalyn Harder, M.D.

## 2021-09-07 ENCOUNTER — Ambulatory Visit (INDEPENDENT_AMBULATORY_CARE_PROVIDER_SITE_OTHER): Payer: BC Managed Care – PPO | Admitting: Gastroenterology

## 2021-09-07 ENCOUNTER — Encounter (INDEPENDENT_AMBULATORY_CARE_PROVIDER_SITE_OTHER): Payer: Self-pay | Admitting: Gastroenterology

## 2021-09-07 ENCOUNTER — Other Ambulatory Visit: Payer: Self-pay

## 2021-09-07 VITALS — BP 160/94 | HR 93 | Temp 97.6°F | Ht 69.0 in | Wt 197.7 lb

## 2021-09-07 DIAGNOSIS — K649 Unspecified hemorrhoids: Secondary | ICD-10-CM | POA: Diagnosis not present

## 2021-09-07 DIAGNOSIS — K219 Gastro-esophageal reflux disease without esophagitis: Secondary | ICD-10-CM | POA: Diagnosis not present

## 2021-09-07 DIAGNOSIS — R143 Flatulence: Secondary | ICD-10-CM | POA: Insufficient documentation

## 2021-09-07 NOTE — Progress Notes (Signed)
Referring Provider: Chevis Pretty, * Primary Care Physician:  Chevis Pretty, FNP Primary GI Physician: Rehman  Chief Complaint  Patient presents with   Follow-up    Patient here today for a follow up. Patient states she gets a "funny" feeling in the upper abdomin at times. She states this is not painful. Has some issues with reflux and started on Pantoprazole 40 mg daily, and dysphagia has gotten better since starting.    HPI:   Christina [name] is a 71 y.o. female with past medical history of clotting disorder, DVT, GERD, HLD, HTN, OA, PE.  Patient presenting today for follow up of GERD. Last visit was jan 2022.   Patient has hospitalization in June 2021 with abdominal pain, nausea and vomiting with 50lbs weight loss, felt to have ventral hernia with intermittent obsruction, she underwent surgery on 01/27/20. Doing well at last visit with 18 lbs weight gain with some remaining sporadic n/v and bloating at times with burning on defecation. No diarrhea or constipation, rectal bleeding or melena. Maintained on Pantoprazole 40mg  with good result and advised to try metamucil 4g by mouth daily for burning with defecation.   Today, she reports that she has sporadic nausea, maybe once per week, she takes pepto bismol for this which helps. She states that she is having a lot of flatulence that she has taken gas-ex for, she denies any abdominal pain. She reports 1 BM per day. Sometimes stools are a little hard but usually does not have any issues. She reports some burning with bowel movements from her hemorrhoids, she is using preparation H with some relief of discomfort. Has used anusol suppositories without good result. She denies any rectal bleeding or melena.   Previously was having some acid reflux and dysphagia, symptoms improved once she start pantoprazole 40mg  once daily with good result, she remains on this and feels that it works well for her. She is trying to watch what she  eats because she had gained back some weight after her ventral hernia repair. She has plans to return to [employer] soon as she has been busy recently with her granddaughter and has not been able to go.  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   Last Colonoscopy:03/28/14 Dr. Britta Mccreedy, normal Last Endoscopy:n/a  Recommendations:    Past Medical History:  Diagnosis Date   Allergic rhinitis    Clotting disorder ([place])    DVT (deep venous thrombosis) ([facility]) 1990's   DVT (deep venous thrombosis) ([place]) 1980s   in the leg behind my knee;  right    Epicondylitis    right    GERD (gastroesophageal reflux disease)    Hyperlipidemia    Hypertension    Osteoarthritis    Pulmonary embolism ([place]) 1990's    Past Surgical History:  Procedure Laterality Date   COLONOSCOPY  2015   CYSTECTOMY     From shoulder and neck   KNEE ARTHROSCOPY Left    Dr Veverly Fells   SKIN GRAFT     ulcer on right leg    TOTAL KNEE ARTHROPLASTY Right 08/23/2019   Procedure: TOTAL KNEE ARTHROPLASTY;  Surgeon: Netta Cedars, MD;  Location: [facility];  Service: Orthopedics;  Laterality: Right;   TUBAL LIGATION     Multiple   UMBILICAL HERNIA REPAIR N/A 01/27/2020   Procedure: EPIGASTRIC HERNIA REPAIR WITH MESH;  Surgeon: Lucretia Roers, MD;  Location: [facility];  Service: General;  Laterality: N/A;  epicastric site    Current Outpatient Medications  Medication Sig Dispense Refill   acetaminophen (TYLENOL) 325 MG tablet Take 2 tablets (650 mg total) by mouth every 6 (six) hours as needed for mild pain (or Fever >/= 101).     amLODipine (NORVASC) 5 MG tablet Take 1 tablet (5 mg total) by mouth daily. 90 tablet 1   apixaban (ELIQUIS) 5 MG TABS tablet Take 1 tablet (5 mg total) by mouth 2 (two) times daily. 60 tablet 5   diclofenac sodium (VOLTAREN) 1 % GEL Apply 2 g topically 3 (three) times daily  as needed (knee pain).     Elastic Bandages & Supports (V-2 HIGH COMPRESSION HOSE) MISC 1 each by Does not apply route daily. 1 each 0   hydrOXYzine (VISTARIL) 25 MG capsule TAKE 1/2 TO 1 TABLET BY MOUTH DAILY 90 capsule 0   Multiple Vitamins-Minerals (MULTIVITAMIN GUMMIES WOMENS) CHEW Chew 1 each by mouth daily.     neomycin-bacitracin-polymyxin (NEOSPORIN) ointment Apply 1 application topically 2 (two) times daily. 15 g 0   hydrocortisone (ANUSOL-HC) 25 MG suppository PLACE 1 SUPPOSITORY RECTALLY 2 TIMES DAILY. (Patient not taking: Reported on 09/07/2021) 12 suppository 0   hydrocortisone-pramoxine (PROCTOFOAM-HC) rectal foam Place 1 applicator rectally 2 (two) times daily. (Patient not taking: Reported on 09/07/2021) 10 g 2   No current facility-administered medications for this visit.    Allergies as of 09/07/2021 - Review Complete 09/07/2021  Allergen Reaction Noted   Ace inhibitors Cough 12/30/2010    Family History  Problem Relation Age of Onset   Heart attack Mother    Deep vein thrombosis Mother    Diabetes Mother    Heart disease Mother    Hyperlipidemia Mother    Cancer Father    Hyperlipidemia Sister    Deep vein thrombosis Daughter    Other Neg Hx        Clotting disorder    Social History   Socioeconomic History   Marital status: Married    Spouse name: Not on file   Number of children: 5   Years of education: Not on file   Highest education level: Not on file  Occupational History   Not on file  Tobacco Use   Smoking status: Former    Types: Cigarettes    Quit date: 08/16/1979    Years since quitting: 42.0   Smokeless tobacco: Never   Tobacco comments:    x33 years  Vaping Use   Vaping Use: Never used  Substance and Sexual Activity   Alcohol use: No    Alcohol/week: 0.0 standard drinks   Drug use: No   Sexual activity: Not on file  Other Topics Concern   Not on file  Social History Narrative   Married.   Social Determinants of Health    Financial Resource Strain: Not on file  Food Insecurity: Not on file  Transportation Needs: Not on file  Physical Activity: Not on file  Stress: Not on file  Social Connections: Not on file   Review of systems General: negative for malaise, night sweats, fever, chills, weight los Neck: Negative for lumps, goiter, pain and significant neck swelling Resp: Negative for cough, wheezing, dyspnea at rest CV: Negative for chest pain, leg swelling, palpitations, orthopnea GI: denies melena, hematochezia, vomiting, diarrhea, constipation, dysphagia, odyonophagia, early satiety or unintentional weight loss. +nausea +abdominal discomfort/gas +rectal burning MSK: Negative for joint pain  or swelling, back pain, and muscle pain. Derm: Negative for itching or rash Psych: Denies depression, anxiety, memory loss, confusion. No homicidal or suicidal ideation.  Heme: Negative for prolonged bleeding, bruising easily, and swollen nodes. Endocrine: Negative for cold or heat intolerance, polyuria, polydipsia and goiter. Neuro: negative for tremor, gait imbalance, syncope and seizures. The remainder of the review of systems is noncontributory.  Physical Exam: BP (!) 160/94 (BP Location: Left Arm, Patient Position: Sitting, Cuff Size: Large)    Pulse 93    Temp 97.6 F (36.4 C) (Oral)    Ht 5\' 9"  (1.753 m)    Wt 197 lb 11.2 oz (89.7 kg)    LMP  (LMP Unknown)    BMI 29.20 kg/m  General:   Alert and oriented. No distress noted. Pleasant and cooperative.  Head:  Normocephalic and atraumatic. Eyes:  Conjuctiva clear without scleral icterus. Mouth:  Oral mucosa pink and moist. Good dentition. No lesions. Heart: Normal rate and rhythm, s1 and s2 heart sounds present.  Lungs: Clear lung sounds in all lobes. Respirations equal and unlabored. Abdomen:  +BS, soft, non-tender and non-distended. No rebound or guarding. No HSM or masses noted. Derm: No palmar erythema or jaundice Msk:  Symmetrical without gross  deformities. Normal posture. Extremities:  Without edema. Neurologic:  Alert and  oriented x4 Psych:  Alert and cooperative. Normal mood and affect.  Invalid input(s): 6 MONTHS   ASSESSMENT: Jiya Groeschel is a 71 y.o. female presenting today for follow up of GERD.   GERD is well managed on pantoprazole 40mg  once daily, no further episodes of dysphagia since being on her PPI. Will continue this with good result.  She is having some excess flatulence recently, with occasional discomfort noted in her lateral upper abdomen that occurs sporadically. She has no red flag symptoms. Discomfort is likely related to gas. I discussed trying otc IBgard to help with this as well as being mindful of certain foods that tend to cause more gas such as roughage/certain veggies and beans.   Nausea is very occasional and without vomiting. She takes pepto bismol occasionally as needed with good result. Appetite is good and weight is stable. She should not use pepto bismol frequently.  She was continuing to have rectal burning after last OV here, she saw PCP who gave her procto foam and anusol suppositories, she states she did not use suppositories but tried foam without any result. She is doing preparation H now which seems to relieve her symptoms. She is having 1 BM per day without constipation or diarrhea. She prefers to continue with preparation H at this time. Will let me know if symptoms are no longer managed with this.   No red flag symptoms. Patient denies melena, hematochezia, vomiting, diarrhea, constipation, dysphagia, odyonophagia, early satiety or weight loss.    PLAN:  Continue Pantoprazole 40mg  daily 2. Can use preparation H as needed  3. IB gard for gas 4. Pepto bismol should only be used on occasion   Follow Up: 1 year  Elazar Argabright L. Alver Sorrow, MSN, APRN, AGNP-C Adult-Gerontology Nurse Practitioner [facility]

## 2021-09-07 NOTE — Patient Instructions (Signed)
Continue taking your pantoprazole 40mg  once daily, stay upright 2-3 hours after eating, prior to laying down and be mindful of trigger foods such as greasy, spicy, tomato based foods, chocolate and caffeine  You can try use over the counter IB gard for excess gas, this is likely related to foods you are eating, especially veggies/beans can cause excess gas You can continue using preparation H, if you find this is no longer working, please let me know. If you have any new or worsening GI symptoms, please give me a call  Follow up 1 year

## 2021-11-13 ENCOUNTER — Other Ambulatory Visit: Payer: Self-pay | Admitting: Nurse Practitioner

## 2021-11-13 DIAGNOSIS — K219 Gastro-esophageal reflux disease without esophagitis: Secondary | ICD-10-CM

## 2021-11-17 ENCOUNTER — Other Ambulatory Visit: Payer: Self-pay | Admitting: Nurse Practitioner

## 2021-12-02 ENCOUNTER — Ambulatory Visit: Payer: BC Managed Care – PPO | Admitting: Nurse Practitioner

## 2021-12-02 ENCOUNTER — Encounter: Payer: Self-pay | Admitting: Nurse Practitioner

## 2021-12-02 DIAGNOSIS — K219 Gastro-esophageal reflux disease without esophagitis: Secondary | ICD-10-CM | POA: Diagnosis not present

## 2021-12-02 MED ORDER — PANTOPRAZOLE SODIUM 40 MG PO TBEC: 40.0000 mg | DELAYED_RELEASE_TABLET | Freq: Every day | ORAL | 1 refills | Status: DC

## 2021-12-02 NOTE — Progress Notes (Signed)
? ?  Virtual Visit  Note ?Due to COVID-19 pandemic this visit was conducted virtually. This visit type was conducted due to national recommendations for restrictions regarding the COVID-19 Pandemic (e.g. social distancing, sheltering in place) in an effort to limit this patient's exposure and mitigate transmission in our community. All issues noted in this document were discussed and addressed.  A physical exam was not performed with this format. ? ?I connected with Christina [name] on 12/02/21 at 4:05 by telephone and verified that I am speaking with the correct person using two identifiers. Christina [name] is currently located at home and her husband  is currently with her during visit. The provider, Mary-Margaret Daphine Deutscher, FNP is located in their office at time of visit. ? ?I discussed the limitations, risks, security and privacy concerns of performing an evaluation and management service by telephone and the availability of in person appointments. I also discussed with the patient that there may be a patient responsible charge related to this service. The patient expressed understanding and agreed to proceed. ? ? ?History and Present Illness: ? ?Patient use to be on meds for acid reflux. She stopped taking and she started having trouble swallowing and started back on it again. That has helped.she just needs a refill of meds. She has no trouble swallowing when on her meds. ? ? ? ? ?Review of Systems  ?Respiratory: Negative.    ?Cardiovascular: Negative.   ?Gastrointestinal:  Positive for heartburn. Negative for abdominal pain, constipation, diarrhea, nausea and vomiting.  ?All other systems reviewed and are negative. ? ? ?Observations/Objective: ?Alert and oriented- answers all questions appropriately ?No distress ? ? ?Assessment and Plan: ?Christina [name] in today with chief complaint of No chief complaint on file. ? ? ?1. Gastroesophageal reflux disease without esophagitis ?Avoid spicy foods ?Do not  eat 2 hours prior to bedtime ? ? ?Meds ordered this encounter  ?Medications  ? pantoprazole (PROTONIX) 40 MG tablet  ?  Sig: Take 1 tablet (40 mg total) by mouth daily.  ?  Dispense:  90 tablet  ?  Refill:  1  ?  Order Specific Question:   Supervising Provider  ?  Answer:   Arville Care A [[id number]]  ? ? ? ?Follow Up Instructions: ?prn ? ?  ?I discussed the assessment and treatment plan with the patient. The patient was provided an opportunity to ask questions and all were answered. The patient agreed with the plan and demonstrated an understanding of the instructions. ?  ?The patient was advised to call back or seek an in-person evaluation if the symptoms worsen or if the condition fails to improve as anticipated. ? ?The above assessment and management plan was discussed with the patient. The patient verbalized understanding of and has agreed to the management plan. Patient is aware to call the clinic if symptoms persist or worsen. Patient is aware when to return to the clinic for a follow-up visit. Patient educated on when it is appropriate to go to the emergency department.  ? ?Time call ended:  4:17 ? ?I provided 12 minutes of  non face-to-face time during this encounter. ? ? ? ?Mary-Margaret Daphine Deutscher, FNP ? ? ?

## 2022-01-27 ENCOUNTER — Ambulatory Visit: Payer: BC Managed Care – PPO | Admitting: Nurse Practitioner

## 2022-01-27 ENCOUNTER — Encounter: Payer: Self-pay | Admitting: Nurse Practitioner

## 2022-01-27 VITALS — BP 136/80 | HR 74 | Temp 97.7°F | Resp 20 | Ht 69.0 in | Wt 199.0 lb

## 2022-01-27 DIAGNOSIS — K641 Second degree hemorrhoids: Secondary | ICD-10-CM

## 2022-01-27 MED ORDER — HYDROCORTISONE ACETATE 25 MG RE SUPP: 25.0000 mg | Freq: Two times a day (BID) | RECTAL | 0 refills | Status: DC

## 2022-01-27 NOTE — Progress Notes (Signed)
   Subjective:    Patient ID: Christina [name], female    DOB: June 03, 1951, 71 y.o.   MRN: [id number]   Chief Complaint: Hemorrhoids (Irritated at times)   HPI Patient comes in today c/o of hemorrhoids.They are internal. Burn and sting. She refuses rectal exam.    Review of Systems  Constitutional:  Negative for diaphoresis.  Eyes:  Negative for pain.  Respiratory:  Negative for shortness of breath.   Cardiovascular:  Negative for chest pain, palpitations and leg swelling.  Gastrointestinal:  Negative for abdominal pain.  Endocrine: Negative for polydipsia.  Skin:  Negative for rash.  Neurological:  Negative for dizziness, weakness and headaches.  Hematological:  Does not bruise/bleed easily.  All other systems reviewed and are negative.      Objective:   Physical Exam Constitutional:      Appearance: Normal appearance.  Cardiovascular:     Rate and Rhythm: Normal rate and regular rhythm.     Heart sounds: Normal heart sounds.  Pulmonary:     Effort: Pulmonary effort is normal.     Breath sounds: Normal breath sounds.  Genitourinary:    Comments: Refuses rectal exam Skin:    General: Skin is warm.  Neurological:     General: No focal deficit present.     Mental Status: She is alert and oriented to person, place, and time.  Psychiatric:        Mood and Affect: Mood normal.        Behavior: Behavior normal.    BP 136/80   Pulse 74   Temp 97.7 F (36.5 C) (Temporal)   Resp 20   Ht 5\' 9"  (1.753 m)   Wt 199 lb (90.3 kg)   LMP  (LMP Unknown)   SpO2 98%   BMI 29.39 kg/m         Assessment & Plan:   Solem in today with chief complaint of Hemorrhoids (Irritated at times)   1. Grade II hemorrhoids Stool softners - Ambulatory referral to Gastroenterology - hydrocortisone (ANUSOL-HC) 25 MG suppository; Place 1 suppository (25 mg total) rectally 2 (two) times daily.  Dispense: 12 suppository; Refill: 0    The above assessment and  management plan was discussed with the patient. The patient verbalized understanding of and has agreed to the management plan. Patient is aware to call the clinic if symptoms persist or worsen. Patient is aware when to return to the clinic for a follow-up visit. Patient educated on when it is appropriate to go to the emergency department.   Mary-Margaret Rosalee Kaufman, FNP

## 2022-01-27 NOTE — Patient Instructions (Signed)
Hemorrhoids Hemorrhoids are swollen veins that may develop: In the butt (rectum). These are called internal hemorrhoids. Around the opening of the butt (anus). These are called external hemorrhoids. Hemorrhoids can cause pain, itching, or bleeding. Most of the time, they do not cause serious problems. They usually get better with diet changes, lifestyle changes, and other home treatments. What are the causes? This condition may be caused by: Having trouble pooping (constipation). Pushing hard (straining) to poop. Watery poop (diarrhea). Pregnancy. Being very overweight (obese). Sitting for long periods of time. Heavy lifting or other activity that causes you to strain. Anal sex. Riding a bike for a long period of time. What are the signs or symptoms? Symptoms of this condition include: Pain. Itching or soreness in the butt. Bleeding from the butt. Leaking poop. Swelling in the area. One or more lumps around the opening of your butt. How is this diagnosed? A doctor can often diagnose this condition by looking at the affected area. The doctor may also: Do an exam that involves feeling the area with a gloved hand (digital rectal exam). Examine the area inside your butt using a small tube (anoscope). Order blood tests. This may be done if you have lost a lot of blood. Have you get a test that involves looking inside the colon using a flexible tube with a camera on the end (sigmoidoscopy or colonoscopy). How is this treated? This condition can usually be treated at home. Your doctor may tell you to change what you eat, make lifestyle changes, or try home treatments. If these do not help, procedures can be done to remove the hemorrhoids or make them smaller. These may involve: Placing rubber bands at the base of the hemorrhoids to cut off their blood supply. Injecting medicine into the hemorrhoids to shrink them. Shining a type of light energy onto the hemorrhoids to cause them to fall  off. Doing surgery to remove the hemorrhoids or cut off their blood supply. Follow these instructions at home: Eating and drinking  Eat foods that have a lot of fiber in them. These include whole grains, beans, nuts, fruits, and vegetables. Ask your doctor about taking products that have added fiber (fibersupplements). Reduce the amount of fat in your diet. You can do this by: Eating low-fat dairy products. Eating less red meat. Avoiding processed foods. Drink enough fluid to keep your pee (urine) pale yellow. Managing pain and swelling  Take a warm-water bath (sitz bath) for 20 minutes to ease pain. Do this 3-4 times a day. You may do this in a bathtub or using a portable sitz bath that fits over the toilet. If told, put ice on the painful area. It may be helpful to use ice between your warm baths. Put ice in a plastic bag. Place a towel between your skin and the bag. Leave the ice on for 20 minutes, 2-3 times a day. General instructions Take over-the-counter and prescription medicines only as told by your doctor. Medicated creams and medicines may be used as told. Exercise often. Ask your doctor how much and what kind of exercise is best for you. Go to the bathroom when you have the urge to poop. Do not wait. Avoid pushing too hard when you poop. Keep your butt dry and clean. Use wet toilet paper or moist towelettes after pooping. Do not sit on the toilet for a long time. Keep all follow-up visits as told by your doctor. This is important. Contact a doctor if you: Have pain and   swelling that do not get better with treatment or medicine. Have trouble pooping. Cannot poop. Have pain or swelling outside the area of the hemorrhoids. Get help right away if you have: Bleeding that will not stop. Summary Hemorrhoids are swollen veins in the butt or around the opening of the butt. They can cause pain, itching, or bleeding. Eat foods that have a lot of fiber in them. These include  whole grains, beans, nuts, fruits, and vegetables. Take a warm-water bath (sitz bath) for 20 minutes to ease pain. Do this 3-4 times a day. This information is not intended to replace advice given to you by your health care provider. Make sure you discuss any questions you have with your health care provider. Document Revised: 02/10/2021 Document Reviewed: 02/10/2021 Elsevier Patient Education  2023 Elsevier Inc.  

## 2022-02-01 ENCOUNTER — Encounter (INDEPENDENT_AMBULATORY_CARE_PROVIDER_SITE_OTHER): Payer: Self-pay | Admitting: *Deleted

## 2022-02-07 ENCOUNTER — Ambulatory Visit: Payer: BC Managed Care – PPO | Admitting: Dermatology

## 2022-02-07 ENCOUNTER — Other Ambulatory Visit: Payer: Self-pay | Admitting: Nurse Practitioner

## 2022-02-07 DIAGNOSIS — L853 Xerosis cutis: Secondary | ICD-10-CM | POA: Diagnosis not present

## 2022-02-07 DIAGNOSIS — I10 Essential (primary) hypertension: Secondary | ICD-10-CM

## 2022-02-07 DIAGNOSIS — D692 Other nonthrombocytopenic purpura: Secondary | ICD-10-CM | POA: Diagnosis not present

## 2022-02-22 ENCOUNTER — Ambulatory Visit (INDEPENDENT_AMBULATORY_CARE_PROVIDER_SITE_OTHER): Payer: BC Managed Care – PPO | Admitting: Gastroenterology

## 2022-02-22 ENCOUNTER — Encounter (INDEPENDENT_AMBULATORY_CARE_PROVIDER_SITE_OTHER): Payer: Self-pay | Admitting: Gastroenterology

## 2022-02-22 VITALS — BP 141/83 | HR 78 | Temp 98.2°F | Ht 69.0 in | Wt 203.1 lb

## 2022-02-22 DIAGNOSIS — K6289 Other specified diseases of anus and rectum: Secondary | ICD-10-CM | POA: Insufficient documentation

## 2022-02-22 DIAGNOSIS — K649 Unspecified hemorrhoids: Secondary | ICD-10-CM | POA: Diagnosis not present

## 2022-02-22 NOTE — Progress Notes (Signed)
Referring Provider: Chevis Pretty, * Primary Care Physician:  Chevis Pretty, FNP Primary GI Physician:   Chief Complaint  Patient presents with   Hemorrhoids    Having a sensation when she sits down that she thinks is from hemorrhoid. Has seen some blood in the past but not any in awhile. Tried anusol supp and it did help. Does not have anymore.    HPI:   Christina [name] is a 71 y.o. female with past medical history of clotting disorder, DVT, GERD, HLD, HTN, OA, PE.   Patient presenting today for rectal irritation, possible hemorrhoids  History: Last seen 09/07/21  At that time, having occasional nausea. 1 BM per day. Sometimes stools a little hard but usually does not have any issues. Having some burning with bowel movements from hemorrhoids, she is using preparation H with some relief of discomfort. Use proctofoam without results, preparation H providing some relief. No rectal bleeding or melena. GERD doing well on protonix 40mg  daily  Present:  Patient reports that she has some rectal discomfort at times when sitting, mostly itching/burning. Denies sensation of any lesions in her rectum. No rectal pain. She also notes some burning sensation after having a BM. She is having a BM mostly every day, occasionally skips a day. Sometimes she will have to strain when this occurs, but usually she will go with ease. Saw PCP recently, diagnosed with grade 2 hemorrhoids, given anusol suppository with improvement in symptoms. Very rarely will see a swipe of blood on toilet tissue. Has occasional abdominal pain thinks related to gas. She is maintained on pantoprazole 40mg  daily, doing well on this. Denies weight loss or changes in appetite.   Last Colonoscopy: august 2015, normal Last Endoscopy:never  Recommendations:    Past Medical History:  Diagnosis Date   Allergic rhinitis    Clotting disorder ([place])    DVT (deep venous thrombosis) ([facility]) 1990's   DVT (deep venous  thrombosis) ([place]) 1980s   in the leg behind my knee;  right    Epicondylitis    right    GERD (gastroesophageal reflux disease)    Hyperlipidemia    Hypertension    Osteoarthritis    Pulmonary embolism ([place]) 1990's    Past Surgical History:  Procedure Laterality Date   COLONOSCOPY  2015   CYSTECTOMY     From shoulder and neck   KNEE ARTHROSCOPY Left    Dr Veverly Fells   SKIN GRAFT     ulcer on right leg    TOTAL KNEE ARTHROPLASTY Right 08/23/2019   Procedure: TOTAL KNEE ARTHROPLASTY;  Surgeon: Netta Cedars, MD;  Location: [facility];  Service: Orthopedics;  Laterality: Right;   TUBAL LIGATION     Multiple   UMBILICAL HERNIA REPAIR N/A 01/27/2020   Procedure: EPIGASTRIC HERNIA REPAIR WITH MESH;  Surgeon: Virl Cagey, MD;  Location: [facility];  Service: General;  Laterality: N/A;  epicastric site    Current Outpatient Medications  Medication Sig Dispense Refill   acetaminophen (TYLENOL) 325 MG tablet Take 2 tablets (650 mg total) by mouth every 6 (six) hours as needed for mild pain (or Fever >/= 101).     amLODipine (NORVASC) 5 MG tablet Take 1 tablet (5 mg total) by mouth daily. (NEEDS TO BE SEEN BEFORE NEXT REFILL) 30 tablet 0   apixaban (ELIQUIS) 5 MG TABS tablet Take 1 tablet (5 mg total) by mouth 2 (two) times daily. 60 tablet 5   diclofenac sodium (VOLTAREN) 1 % GEL Apply 2  g topically 3 (three) times daily as needed (knee pain).     Elastic Bandages & Supports (V-2 HIGH COMPRESSION HOSE) MISC 1 each by Does not apply route daily. 1 each 0   Multiple Vitamins-Minerals (MULTIVITAMIN GUMMIES WOMENS) CHEW Chew 1 each by mouth daily.     neomycin-bacitracin-polymyxin (NEOSPORIN) ointment Apply 1 application topically 2 (two) times daily. 15 g 0   pantoprazole (PROTONIX) 40 MG tablet Take 1 tablet (40 mg total) by mouth daily. 90 tablet 1   hydrocortisone (ANUSOL-HC) 25 MG suppository Place 1 suppository (25 mg total) rectally 2 (two) times daily. (Patient not taking: Reported on  02/22/2022) 12 suppository 0   hydrocortisone-pramoxine (PROCTOFOAM-HC) rectal foam Place 1 applicator rectally 2 (two) times daily. (Patient not taking: Reported on 02/22/2022) 10 g 2   No current facility-administered medications for this visit.    Allergies as of 02/22/2022 - Review Complete 02/22/2022  Allergen Reaction Noted   Ace inhibitors Cough 12/30/2010    Family History  Problem Relation Age of Onset   Heart attack Mother    Deep vein thrombosis Mother    Diabetes Mother    Heart disease Mother    Hyperlipidemia Mother    Cancer Father    Hyperlipidemia Sister    Deep vein thrombosis Daughter    Other Neg Hx        Clotting disorder    Social History   Socioeconomic History   Marital status: Married    Spouse name: Not on file   Number of children: 5   Years of education: Not on file   Highest education level: Not on file  Occupational History   Not on file  Tobacco Use   Smoking status: Former    Types: Cigarettes    Quit date: 08/16/1979    Years since quitting: 42.5    Passive exposure: Past   Smokeless tobacco: Never   Tobacco comments:    x33 years  Vaping Use   Vaping Use: Never used  Substance and Sexual Activity   Alcohol use: No    Alcohol/week: 0.0 standard drinks of alcohol   Drug use: No   Sexual activity: Not on file  Other Topics Concern   Not on file  Social History Narrative   Married.   Social Determinants of Health   Financial Resource Strain: Not on file  Food Insecurity: Not on file  Transportation Needs: Not on file  Physical Activity: Not on file  Stress: Not on file  Social Connections: Not on file   Review of systems General: negative for malaise, night sweats, fever, chills, weight loss Neck: Negative for lumps, goiter, pain and significant neck swelling Resp: Negative for cough, wheezing, dyspnea at rest CV: Negative for chest pain, leg swelling, palpitations, orthopnea GI: denies melena, hematochezia, nausea,  vomiting, diarrhea, constipation, dysphagia, odyonophagia, early satiety or unintentional weight loss. +rectal irritation MSK: Negative for joint pain or swelling, back pain, and muscle pain. Derm: Negative for itching or rash Psych: Denies depression, anxiety, memory loss, confusion. No homicidal or suicidal ideation.  Heme: Negative for prolonged bleeding, bruising easily, and swollen nodes. Endocrine: Negative for cold or heat intolerance, polyuria, polydipsia and goiter. Neuro: negative for tremor, gait imbalance, syncope and seizures. The remainder of the review of systems is noncontributory.  Physical Exam: BP (!) 141/83 (BP Location: Right Arm, Patient Position: Sitting, Cuff Size: Large)   Pulse 78   Temp 98.2 F (36.8 C) (Oral)   Ht 5\' 9"  (1.753 m)  Wt 203 lb 1.6 oz (92.1 kg)   LMP  (LMP Unknown)   BMI 29.99 kg/m  General:   Alert and oriented. No distress noted. Pleasant and cooperative.  Head:  Normocephalic and atraumatic. Eyes:  Conjuctiva clear without scleral icterus. Mouth:  Oral mucosa pink and moist. Good dentition. No lesions. Heart: Normal rate and rhythm, s1 and s2 heart sounds present.  Lungs: Clear lung sounds in all lobes. Respirations equal and unlabored. Abdomen:  +BS, soft, non-tender and non-distended. No rebound or guarding. No HSM or masses noted. Rectal: Alain Honey, LPN present as witness, no lesions or masses noted, no fissures or external hemorrhoids noted. Sphincter tone WNL, pt denied pain Derm: No palmar erythema or jaundice Msk:  Symmetrical without gross deformities. Normal posture. Extremities:  Without edema. Neurologic:  Alert and  oriented x4 Psych:  Alert and cooperative. Normal mood and affect.  Invalid input(s): "6 MONTHS"   ASSESSMENT: Timisha Mondry is a 71 y.o. female presenting today for rectal itching/irritation.  Patient complaining of rectal irritation itching at times with some burning after bowel movements.   Recently saw PCP for symptoms suspected secondary to hemorrhoids, she was given Anusol suppositories which she reports improved her symptoms some.  She denies frequent constipation or the need to strain to have a bowel movement.  She has very rare toilet tissue hematochezia.  Rectal exam was unremarkable.  Last colonoscopy was in August 2015 and was normal.  She has no family hx of CRC.  She denies any weight loss or changes in bowel habits. I suspect that symptoms could be related to internal hemorrhoids.  She should continue with ample water intake diet high in fruits, veggies, and whole grains.  She should limit toilet time and avoid straining.  Patient very soft stools.  She can use the Anusol suppositories that she was previously given as needed and can try using witch hazel wipes for itching and irritation.  She also can try applying some over-the-counter zinc oxide ointment to help with any rectal irritation that she may be having.  She will let me know if her symptoms worsen or she has more frequent or heavier rectal bleeding.    No red flag symptoms. Patient denies melena, hematochezia, nausea, vomiting, diarrhea, dysphagia, odyonophagia, early satiety or weight loss.   PLAN:  High water intake, fruits, veggies and whole grains  2. Witch hazel pads for discomfort  3. Can use anusol suppositories as needed 4. Try otc zinc oxide ointment to help with irritation/itching 5. Avoid straining, limit toilet time, aim for soft stools 6. Pt will make me aware of worsening symptoms or frequent/heavy rectal bleeding  All questions were answered, patient verbalized understanding and is in agreement with plan as outlined above.    Follow Up: Has follow up in jan 2024  Braelee Herrle L. Jeanmarie Hubert, MSN, APRN, AGNP-C Adult-Gerontology Nurse Practitioner [facility]

## 2022-02-22 NOTE — Patient Instructions (Addendum)
As we discussed, I did not see any obvious lesions on rectal exam, symptoms are likely from internal hemorrhoids which can usually not be felt on exam unless very large. Please drink plenty of water and eat a diet high in fruits veggie and whole grains, avoid straining and limit toilet time. You can use wet wipes to keep yourself very clean after BMs, also soaking in warm tub can help with discomfort. If symptoms are becoming worse or you are having worsening rectal bleeding, please let me know. You can also try some otc zinc oxide cream like desitin as this can sometimes help to sooth irritation in the rectal area.   Looks like you already have follow up with [place] in January, we will see you then unless you have  new or worsening symptoms before

## 2022-03-05 ENCOUNTER — Other Ambulatory Visit: Payer: Self-pay | Admitting: Nurse Practitioner

## 2022-03-05 DIAGNOSIS — I10 Essential (primary) hypertension: Secondary | ICD-10-CM

## 2022-03-06 ENCOUNTER — Encounter: Payer: Self-pay | Admitting: Dermatology

## 2022-03-06 NOTE — Progress Notes (Signed)
   New Patient   Subjective  Christina [name] is a 71 y.o. female who presents for the following: New Patient (Initial Visit) (Here because PCP referred patient over here for bruising on legs but it went away.x months Patient also has itchy skin.  ).  History of bruising on legs, generalized dry itchy skin Location:  Duration:  Quality:  Associated Signs/Symptoms: Modifying Factors:  Severity:  Timing: Context:    The following portions of the chart were reviewed this encounter and updated as appropriate:  Tobacco  Allergies  Meds  Problems  Med Hx  Surg Hx  Fam Hx      Objective  Well appearing patient in no apparent distress; mood and affect are within normal limits. Right Lower Leg - Anterior Moderate xerosis, discussed proper technique for hydrating skin  Left Lower Leg - Anterior Self resolved none present today    A focused examination was performed including arms, legs, head, neck. Relevant physical exam findings are noted in the Assessment and Plan.   Assessment & Plan  Dry skin Right Lower Leg - Anterior  Over the counter CeraVe coupon given  Solar purpura ([facility]) Left Lower Leg - Anterior  DerMend over the counter if it returns

## 2022-03-07 ENCOUNTER — Encounter: Payer: Self-pay | Admitting: Nurse Practitioner

## 2022-03-07 NOTE — Telephone Encounter (Signed)
LMTCB TO SCHEDULE APPT LETTER MAILED 

## 2022-03-07 NOTE — Telephone Encounter (Signed)
MMM NTBS 30 days given 02/07/22 

## 2022-03-08 ENCOUNTER — Encounter: Payer: Self-pay | Admitting: Nurse Practitioner

## 2022-03-08 ENCOUNTER — Ambulatory Visit: Payer: BC Managed Care – PPO | Admitting: Nurse Practitioner

## 2022-03-08 VITALS — BP 124/77 | HR 75 | Temp 97.2°F | Resp 20 | Ht 69.0 in | Wt 204.0 lb

## 2022-03-08 DIAGNOSIS — R609 Edema, unspecified: Secondary | ICD-10-CM

## 2022-03-08 DIAGNOSIS — I10 Essential (primary) hypertension: Secondary | ICD-10-CM | POA: Diagnosis not present

## 2022-03-08 DIAGNOSIS — E785 Hyperlipidemia, unspecified: Secondary | ICD-10-CM

## 2022-03-08 DIAGNOSIS — Z86718 Personal history of other venous thrombosis and embolism: Secondary | ICD-10-CM

## 2022-03-08 DIAGNOSIS — K219 Gastro-esophageal reflux disease without esophagitis: Secondary | ICD-10-CM

## 2022-03-08 DIAGNOSIS — Z6830 Body mass index (BMI) 30.0-30.9, adult: Secondary | ICD-10-CM

## 2022-03-08 MED ORDER — PANTOPRAZOLE SODIUM 40 MG PO TBEC: 40.0000 mg | DELAYED_RELEASE_TABLET | Freq: Every day | ORAL | 1 refills | Status: DC

## 2022-03-08 MED ORDER — APIXABAN 5 MG PO TABS: 5.0000 mg | ORAL_TABLET | Freq: Two times a day (BID) | ORAL | 5 refills | Status: DC

## 2022-03-08 MED ORDER — AMLODIPINE BESYLATE 5 MG PO TABS: 5.0000 mg | ORAL_TABLET | Freq: Every day | ORAL | 1 refills | Status: DC

## 2022-03-08 NOTE — Progress Notes (Signed)
Subjective:    Patient ID: Christina [name], female    DOB: [birth year], 71 y.o.   MRN: [id number]   Chief Complaint: Medical Management of Chronic Issues    HPI:  Christina [name] is a 71 y.o. who identifies as a female who was assigned female at birth.   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 Primary hypertension No chest pain, sob oir headache. Doe snot check blood pressure at home. BP Readings from Last 3 Encounters:  02/22/22 (!) 141/83  01/27/22 136/80  09/07/21 (!) 160/94     2. Hyperlipidemia with target LDL less than 100 Does try to watch diet but doe snot do much exercise. Lab Results  Component Value Date   CHOL 238 (H) 07/13/2021   HDL 76 07/13/2021   LDLCALC 147 (H) 07/13/2021   TRIG 88 07/13/2021   CHOLHDL 3.1 07/13/2021     3. Gastroesophageal reflux disease, unspecified whether esophagitis present Is on protonix daily and is doing well.  4. Peripheral edema Has by end of each day  Wt Readings from Last 3 Encounters:  03/08/22 204 lb (92.5 kg)  02/22/22 203 lb 1.6 oz (92.1 kg)  01/27/22 199 lb (90.3 kg)   BMI Readings from Last 3 Encounters:  03/08/22 30.13 kg/m  02/22/22 29.99 kg/m  01/27/22 29.39 kg/m    New complaints: None today   Allergies  Allergen Reactions   Ace Inhibitors Cough        Outpatient Encounter Medications as of 03/08/2022  Medication Sig   acetaminophen (TYLENOL) 325 MG tablet Take 2 tablets (650 mg total) by mouth every 6 (six) hours as needed for mild pain (or Fever >/= 101).   amLODipine (NORVASC) 5 MG tablet Take 1 tablet (5 mg total) by mouth daily. (NEEDS TO BE SEEN BEFORE NEXT REFILL)   apixaban (ELIQUIS) 5 MG TABS tablet Take 1 tablet (5 mg total) by mouth 2 (two) times daily.   diclofenac sodium (VOLTAREN) 1 % GEL Apply 2 g topically 3 (three) times daily as needed  (knee pain).   hydrocortisone (ANUSOL-HC) 25 MG suppository Place 1 suppository (25 mg total) rectally 2 (two) times daily.   hydrocortisone-pramoxine (PROCTOFOAM-HC) rectal foam Place 1 applicator rectally 2 (two) times daily.   Multiple Vitamins-Minerals (MULTIVITAMIN GUMMIES WOMENS) CHEW Chew 1 each by mouth daily.   neomycin-bacitracin-polymyxin (NEOSPORIN) ointment Apply 1 application topically 2 (two) times daily.   pantoprazole (PROTONIX) 40 MG tablet Take 1 tablet (40 mg total) by mouth daily.   [DISCONTINUED] Elastic Bandages & Supports (V-2 HIGH COMPRESSION HOSE) MISC 1 each by Does not apply route daily.   No facility-administered encounter medications on file as of 03/08/2022.    Past Surgical History:  Procedure Laterality Date   COLONOSCOPY  2015   CYSTECTOMY     From shoulder and neck   KNEE ARTHROSCOPY Left    Dr Veverly Fells   SKIN GRAFT     ulcer on right leg    TOTAL KNEE ARTHROPLASTY Right 08/23/2019   Procedure: TOTAL KNEE ARTHROPLASTY;  Surgeon: Netta Cedars, MD;  Location: [facility];  Service: Orthopedics;  Laterality: Right;   TUBAL LIGATION     Multiple   UMBILICAL HERNIA REPAIR N/A 01/27/2020   Procedure: EPIGASTRIC HERNIA REPAIR WITH MESH;  Surgeon: Virl Cagey, MD;  Location: [facility];  Service: General;  Laterality: N/A;  epicastric site    Family History  Problem Relation Age of Onset   Heart attack Mother    Deep vein thrombosis Mother    Diabetes Mother    Heart disease Mother    Hyperlipidemia Mother    Cancer Father    Hyperlipidemia Sister    Deep vein thrombosis Daughter    Other Neg Hx        Clotting disorder      Controlled substance contract: n/a     Review of Systems  Constitutional:  Negative for diaphoresis.  Eyes:  Negative for pain.  Respiratory:  Negative for shortness of breath.   Cardiovascular:  Negative for chest pain, palpitations and leg swelling.  Gastrointestinal:  Negative for abdominal pain.  Endocrine: Negative  for polydipsia.  Skin:  Negative for rash.  Neurological:  Negative for dizziness, weakness and headaches.  Hematological:  Does not bruise/bleed easily.  All other systems reviewed and are negative.      Objective:   Physical Exam Vitals and nursing note reviewed.  Constitutional:      General: She is not in acute distress.    Appearance: Normal appearance. She is well-developed.  HENT:     Head: Normocephalic.     Right Ear: Tympanic membrane normal.     Left Ear: Tympanic membrane normal.     Nose: Nose normal.     Mouth/Throat:     Mouth: Mucous membranes are moist.  Eyes:     Pupils: Pupils are equal, round, and reactive to light.  Neck:     Vascular: No carotid bruit or JVD.  Cardiovascular:     Rate and Rhythm: Normal rate and regular rhythm.     Heart sounds: Normal heart sounds.  Pulmonary:     Effort: Pulmonary effort is normal. No respiratory distress.     Breath sounds: Normal breath sounds. No wheezing or rales.  Chest:     Chest wall: No tenderness.  Abdominal:     General: Bowel sounds are normal. There is no distension or abdominal bruit.     Palpations: Abdomen is soft. There is no hepatomegaly, splenomegaly, mass or pulsatile mass.     Tenderness: There is no abdominal tenderness.  Musculoskeletal:        General: Normal range of motion.     Cervical back: Normal range of motion and neck supple.  Lymphadenopathy:     Cervical: No cervical adenopathy.  Skin:    General: Skin is warm and dry.  Neurological:     Mental Status: She is alert and oriented to person, place, and time.     Deep Tendon Reflexes: Reflexes are normal and symmetric.  Psychiatric:        Behavior: Behavior normal.        Thought Content: Thought content normal.        Judgment: Judgment normal.     BP 124/77   Pulse 75   Temp (!) 97.2 F (36.2 C) (Skin)   Resp 20   Ht _0  (1.753 m)   Wt 204 lb (92.5 kg)   LMP  (LMP Unknown)   BMI 30.13 kg/m         Assessment  & Plan:  [name] comes in today with chief complaint of Medical Management of Chronic Issues   Diagnosis and orders addressed:  1. Primary hypertension Low sodium diet - amLODipine (NORVASC) 5 MG tablet; Take 1 tablet (5 mg total) by mouth daily. (NEEDS TO BE  SEEN BEFORE NEXT REFILL)  Dispense: 90 tablet; Refill: 1 - CBC with Differential/Platelet - CMP14+EGFR  2. Hyperlipidemia with target LDL less than 100 Low fat diet - Lipid panel  3. Gastroesophageal reflux disease, unspecified whether esophagitis present Avoid spicy foods Do not eat 2 hours prior to bedtime - pantoprazole (PROTONIX) 40 MG tablet; Take 1 tablet (40 mg total) by mouth daily.  Dispense: 90 tablet; Refill: 1  4. Peripheral edema Elevate legs when sitting Compression hose/socks when up on feet  5. BMI 30.0-30.9,adult Discussed diet and exercise for person with BMI >25 Will recheck weight in 3-6 months  6. History of DVT of lower extremity - apixaban (ELIQUIS) 5 MG TABS tablet; Take 1 tablet (5 mg total) by mouth 2 (two) times daily.  Dispense: 60 tablet; Refill: 5   Labs pending Health Maintenance reviewed Diet and exercise encouraged  Follow up plan: 6 months   Mary-Margaret Hassell Done, FNP

## 2022-03-08 NOTE — Patient Instructions (Signed)

## 2022-03-09 LAB — CMP14+EGFR
ALT: 18 IU/L (ref 0–32)
AST: 35 IU/L (ref 0–40)
Albumin/Globulin Ratio: 1.6 (ref 1.2–2.2)
Albumin: 4.6 g/dL (ref 3.8–4.8)
Alkaline Phosphatase: 143 IU/L — ABNORMAL HIGH (ref 44–121)
BUN/Creatinine Ratio: 12 (ref 12–28)
BUN: 13 mg/dL (ref 8–27)
Bilirubin Total: 0.3 mg/dL (ref 0.0–1.2)
CO2: 18 mmol/L — ABNORMAL LOW (ref 20–29)
Calcium: 9.9 mg/dL (ref 8.7–10.3)
Chloride: 106 mmol/L (ref 96–106)
Creatinine, Ser: 1.1 mg/dL — ABNORMAL HIGH (ref 0.57–1.00)
Globulin, Total: 2.8 g/dL (ref 1.5–4.5)
Glucose: 82 mg/dL (ref 70–99)
Potassium: 4.7 mmol/L (ref 3.5–5.2)
Sodium: 142 mmol/L (ref 134–144)
Total Protein: 7.4 g/dL (ref 6.0–8.5)
eGFR: 53 mL/min/{1.73_m2} — ABNORMAL LOW (ref >59)

## 2022-03-09 LAB — CBC WITH DIFFERENTIAL/PLATELET
Basophils Absolute: 0 10*3/uL (ref 0.0–0.2)
Basos: 1 %
EOS (ABSOLUTE): 0 10*3/uL (ref 0.0–0.4)
Eos: 1 %
Hematocrit: 41.3 % (ref 34.0–46.6)
Hemoglobin: 13.9 g/dL (ref 11.1–15.9)
Immature Grans (Abs): 0 10*3/uL (ref 0.0–0.1)
Immature Granulocytes: 0 %
Lymphocytes Absolute: 1.6 10*3/uL (ref 0.7–3.1)
Lymphs: 28 %
MCH: 26.1 pg — ABNORMAL LOW (ref 26.6–33.0)
MCHC: 33.7 g/dL (ref 31.5–35.7)
MCV: 78 fL — ABNORMAL LOW (ref 79–97)
Monocytes Absolute: 0.4 10*3/uL (ref 0.1–0.9)
Monocytes: 7 %
Neutrophils Absolute: 3.6 10*3/uL (ref 1.4–7.0)
Neutrophils: 63 %
Platelets: 262 10*3/uL (ref 150–450)
RBC: 5.32 x10E6/uL — ABNORMAL HIGH (ref 3.77–5.28)
RDW: 13.4 % (ref 11.7–15.4)
WBC: 5.7 10*3/uL (ref 3.4–10.8)

## 2022-03-09 LAB — LIPID PANEL
Chol/HDL Ratio: 3.1 ratio (ref 0.0–4.4)
Cholesterol, Total: 249 mg/dL — ABNORMAL HIGH (ref 100–199)
HDL: 80 mg/dL (ref >39)
LDL Chol Calc (NIH): 154 mg/dL — ABNORMAL HIGH (ref 0–99)
Triglycerides: 91 mg/dL (ref 0–149)
VLDL Cholesterol Cal: 15 mg/dL (ref 5–40)

## 2022-03-15 ENCOUNTER — Telehealth: Payer: Self-pay | Admitting: Nurse Practitioner

## 2022-03-15 NOTE — Telephone Encounter (Signed)
Left detailed message on patients voicemail with results 

## 2022-06-25 ENCOUNTER — Encounter (INDEPENDENT_AMBULATORY_CARE_PROVIDER_SITE_OTHER): Payer: Self-pay | Admitting: Gastroenterology

## 2022-07-22 ENCOUNTER — Ambulatory Visit (INDEPENDENT_AMBULATORY_CARE_PROVIDER_SITE_OTHER): Payer: BC Managed Care – PPO

## 2022-07-22 DIAGNOSIS — Z23 Encounter for immunization: Secondary | ICD-10-CM

## 2022-08-11 ENCOUNTER — Encounter (INDEPENDENT_AMBULATORY_CARE_PROVIDER_SITE_OTHER): Payer: Self-pay | Admitting: Gastroenterology

## 2022-09-02 ENCOUNTER — Other Ambulatory Visit: Payer: Self-pay | Admitting: Nurse Practitioner

## 2022-09-02 DIAGNOSIS — I10 Essential (primary) hypertension: Secondary | ICD-10-CM

## 2022-09-08 ENCOUNTER — Ambulatory Visit (INDEPENDENT_AMBULATORY_CARE_PROVIDER_SITE_OTHER): Payer: BC Managed Care – PPO | Admitting: Gastroenterology

## 2022-09-08 ENCOUNTER — Ambulatory Visit: Payer: BC Managed Care – PPO | Admitting: Nurse Practitioner

## 2022-09-09 ENCOUNTER — Encounter: Payer: Self-pay | Admitting: Nurse Practitioner

## 2022-09-09 ENCOUNTER — Ambulatory Visit: Payer: BC Managed Care – PPO | Admitting: Nurse Practitioner

## 2022-09-09 VITALS — BP 138/83 | HR 79 | Temp 97.6°F | Resp 20 | Ht 69.0 in | Wt 208.0 lb

## 2022-09-09 DIAGNOSIS — R600 Localized edema: Secondary | ICD-10-CM

## 2022-09-09 DIAGNOSIS — K219 Gastro-esophageal reflux disease without esophagitis: Secondary | ICD-10-CM

## 2022-09-09 DIAGNOSIS — Z6830 Body mass index (BMI) 30.0-30.9, adult: Secondary | ICD-10-CM

## 2022-09-09 DIAGNOSIS — E785 Hyperlipidemia, unspecified: Secondary | ICD-10-CM | POA: Diagnosis not present

## 2022-09-09 DIAGNOSIS — R609 Edema, unspecified: Secondary | ICD-10-CM

## 2022-09-09 DIAGNOSIS — I10 Essential (primary) hypertension: Secondary | ICD-10-CM

## 2022-09-09 DIAGNOSIS — Z86718 Personal history of other venous thrombosis and embolism: Secondary | ICD-10-CM

## 2022-09-09 MED ORDER — ROSUVASTATIN CALCIUM 10 MG PO TABS: 10.0000 mg | ORAL_TABLET | Freq: Every day | ORAL | 3 refills | Status: DC

## 2022-09-09 MED ORDER — APIXABAN 5 MG PO TABS: 5.0000 mg | ORAL_TABLET | Freq: Two times a day (BID) | ORAL | 5 refills | Status: DC

## 2022-09-09 MED ORDER — PANTOPRAZOLE SODIUM 40 MG PO TBEC: 40.0000 mg | DELAYED_RELEASE_TABLET | Freq: Every day | ORAL | 1 refills | Status: DC

## 2022-09-09 MED ORDER — AMLODIPINE BESYLATE 5 MG PO TABS: 5.0000 mg | ORAL_TABLET | Freq: Every day | ORAL | 0 refills | Status: DC

## 2022-09-09 NOTE — Patient Instructions (Signed)
Peripheral Edema  Peripheral edema is swelling that is caused by a buildup of fluid. Peripheral edema most often affects the lower legs, ankles, and feet. It can also develop in the arms, hands, and face. The area of the body that has peripheral edema will look swollen. It may also feel heavy or warm. Your clothes may start to feel tight. Pressing on the area may make a temporary dent in your skin (pitting edema). You may not be able to move your swollen arm or leg as much as usual. There are many causes of peripheral edema. It can happen because of a complication of other conditions such as heart failure, kidney disease, or a problem with your circulation. It also can be a side effect of certain medicines or happen because of an infection. It often happens to women during pregnancy. Sometimes, the cause is not known. Follow these instructions at home: Managing pain, stiffness, and swelling  Raise (elevate) your legs while you are sitting or lying down. Move around often to prevent stiffness and to reduce swelling. Do not sit or stand for long periods of time. Do not wear tight clothing. Do not wear garters on your upper legs. Exercise your legs to get your circulation going. This helps to move the fluid back into your blood vessels, and it may help the swelling go down. Wear compression stockings as told by your health care provider. These stockings help to prevent blood clots and reduce swelling in your legs. It is important that these are the correct size. These stockings should be prescribed by your doctor to prevent possible injuries. If elastic bandages or wraps are recommended, use them as told by your health care provider. Medicines Take over-the-counter and prescription medicines only as told by your health care provider. Your health care provider may prescribe medicine to help your body get rid of excess water (diuretic). Take this medicine if you are told to take it. General  instructions Eat a low-salt (low-sodium) diet as told by your health care provider. Sometimes, eating less salt may reduce swelling. Pay attention to any changes in your symptoms. Moisturize your skin daily to help prevent skin from cracking and draining. Keep all follow-up visits. This is important. Contact a health care provider if: You have a fever. You have swelling in only one leg. You have increased swelling, redness, or pain in one or both of your legs. You have drainage or sores at the area where you have edema. Get help right away if: You have edema that starts suddenly or is getting worse, especially if you are pregnant or have a medical condition. You develop shortness of breath, especially when you are lying down. You have pain in your chest or abdomen. You feel weak. You feel like you will faint. These symptoms may be an emergency. Get help right away. Call 911. Do not wait to see if the symptoms will go away. Do not drive yourself to the hospital. Summary Peripheral edema is swelling that is caused by a buildup of fluid. Peripheral edema most often affects the lower legs, ankles, and feet. Move around often to prevent stiffness and to reduce swelling. Do not sit or stand for long periods of time. Pay attention to any changes in your symptoms. Contact a health care provider if you have edema that starts suddenly or is getting worse, especially if you are pregnant or have a medical condition. Get help right away if you develop shortness of breath, especially when lying down.   This information is not intended to replace advice given to you by your health care provider. Make sure you discuss any questions you have with your health care provider. Document Revised: 04/05/2021 Document Reviewed: 04/05/2021 Elsevier Patient Education  2023 Elsevier Inc.  

## 2022-09-09 NOTE — Progress Notes (Signed)
Subjective:    Patient ID: Christina [name], female    DOB: [birth year], 72 y.o.   MRN: [id number]   Chief Complaint: medical management of chronic issues     HPI:  Christina [name] is a 72 y.o. who identifies as a female who was assigned female at birth.   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 Primary hypertension No c/o chest pain, sob or headache. Does not check blood pressure at home. BP Readings from Last 3 Encounters:  03/08/22 124/77  02/22/22 (!) 141/83  01/27/22 136/80     2. Hyperlipidemia with target LDL less than 100 Does not watch diet. Does no dedicated exercise. Lab Results  Component Value Date   CHOL 249 (H) 03/08/2022   HDL 80 03/08/2022   LDLCALC 154 (H) 03/08/2022   TRIG 91 03/08/2022   CHOLHDL 3.1 03/08/2022    The 10-year ASCVD risk score (Arnett DK, et al., 2019) is: 20.7%  3. Gastroesophageal reflux disease, unspecified whether esophagitis present Is on protonix daily and is doing well.  4. Peripheral edema Has some occasionally if she is on her feet to much.  5. BMI 30.0-30.9,adult No recent weight changes Wt Readings from Last 3 Encounters:  09/09/22 208 lb (94.3 kg)  03/08/22 204 lb (92.5 kg)  02/22/22 203 lb 1.6 oz (92.1 kg)   BMI Readings from Last 3 Encounters:  09/09/22 30.72 kg/m  03/08/22 30.13 kg/m  02/22/22 29.99 kg/m     New complaints: None today-  Allergies  Allergen Reactions   Ace Inhibitors Cough        Outpatient Encounter Medications as of 09/09/2022  Medication Sig   acetaminophen (TYLENOL) 325 MG tablet Take 2 tablets (650 mg total) by mouth every 6 (six) hours as needed for mild pain (or Fever >/= 101).   amLODipine (NORVASC) 5 MG tablet Take 1 tablet (5 mg total) by mouth daily.   apixaban (ELIQUIS) 5 MG TABS tablet Take 1 tablet (5 mg total) by mouth 2 (two)  times daily.   diclofenac sodium (VOLTAREN) 1 % GEL Apply 2 g topically 3 (three) times daily as needed (knee pain).   hydrocortisone (ANUSOL-HC) 25 MG suppository Place 1 suppository (25 mg total) rectally 2 (two) times daily.   hydrocortisone-pramoxine (PROCTOFOAM-HC) rectal foam Place 1 applicator rectally 2 (two) times daily.   Multiple Vitamins-Minerals (MULTIVITAMIN GUMMIES WOMENS) CHEW Chew 1 each by mouth daily.   neomycin-bacitracin-polymyxin (NEOSPORIN) ointment Apply 1 application topically 2 (two) times daily.   pantoprazole (PROTONIX) 40 MG tablet Take 1 tablet (40 mg total) by mouth daily.   No facility-administered encounter medications on file as of 09/09/2022.    Past Surgical History:  Procedure Laterality Date   COLONOSCOPY  2015   CYSTECTOMY     From shoulder and neck   KNEE ARTHROSCOPY Left    Dr Veverly Fells   SKIN GRAFT     ulcer on right leg    TOTAL KNEE ARTHROPLASTY Right 08/23/2019   Procedure: TOTAL KNEE ARTHROPLASTY;  Surgeon: Netta Cedars, MD;  Location: [facility];  Service: Orthopedics;  Laterality: Right;   TUBAL LIGATION     Multiple   UMBILICAL HERNIA REPAIR N/A 01/27/2020   Procedure: EPIGASTRIC HERNIA REPAIR WITH MESH;  Surgeon: Virl Cagey, MD;  Location: [facility];  Service: General;  Laterality: N/A;  epicastric site  Family History  Problem Relation Age of Onset   Heart attack Mother    Deep vein thrombosis Mother    Diabetes Mother    Heart disease Mother    Hyperlipidemia Mother    Cancer Father    Hyperlipidemia Sister    Deep vein thrombosis Daughter    Other Neg Hx        Clotting disorder      Controlled substance contract: n/a     Review of Systems  Constitutional:  Negative for diaphoresis.  Eyes:  Negative for pain.  Respiratory:  Negative for shortness of breath.   Cardiovascular:  Negative for chest pain, palpitations and leg swelling.  Gastrointestinal:  Negative for abdominal pain.  Endocrine: Negative for  polydipsia.  Skin:  Negative for rash.  Neurological:  Negative for dizziness, weakness and headaches.  Hematological:  Does not bruise/bleed easily.  All other systems reviewed and are negative.      Objective:   Physical Exam Vitals and nursing note reviewed.  Constitutional:      General: She is not in acute distress.    Appearance: Normal appearance. She is well-developed.  HENT:     Head: Normocephalic.     Right Ear: Tympanic membrane normal.     Left Ear: Tympanic membrane normal.     Nose: Nose normal.     Mouth/Throat:     Mouth: Mucous membranes are moist.  Eyes:     Pupils: Pupils are equal, round, and reactive to light.  Neck:     Vascular: No carotid bruit or JVD.  Cardiovascular:     Rate and Rhythm: Normal rate and regular rhythm.     Heart sounds: Normal heart sounds.  Pulmonary:     Effort: Pulmonary effort is normal. No respiratory distress.     Breath sounds: Normal breath sounds. No wheezing or rales.  Chest:     Chest wall: No tenderness.  Abdominal:     General: Bowel sounds are normal. There is no distension or abdominal bruit.     Palpations: Abdomen is soft. There is no hepatomegaly, splenomegaly, mass or pulsatile mass.     Tenderness: There is no abdominal tenderness.  Musculoskeletal:        General: Normal range of motion.     Cervical back: Normal range of motion and neck supple.  Lymphadenopathy:     Cervical: No cervical adenopathy.  Skin:    General: Skin is warm and dry.  Neurological:     Mental Status: She is alert and oriented to person, place, and time.     Deep Tendon Reflexes: Reflexes are normal and symmetric.  Psychiatric:        Behavior: Behavior normal.        Thought Content: Thought content normal.        Judgment: Judgment normal.    BP 138/83   Pulse 79   Temp 97.6 F (36.4 C) (Temporal)   Resp 20   Ht 5\' 9"  (1.753 m)   Wt 208 lb (94.3 kg)   LMP  (LMP Unknown)   SpO2 95%   BMI 30.72 kg/m          Assessment & Plan:  Christina [name] comes in today with chief complaint of Medical Management of Chronic Issues   Diagnosis and orders addressed:  1. Primary hypertension Low sodium - amLODipine (NORVASC) 5 MG tablet; Take 1 tablet (5 mg total) by mouth daily.  Dispense: 90 tablet; Refill: 0 -  CBC with Differential/Platelet - CMP14+EGFR  2. Hyperlipidemia with target LDL less than 100 Low fat diet - rosuvastatin (CRESTOR) 10 MG tablet; Take 1 tablet (10 mg total) by mouth daily.  Dispense: 90 tablet; Refill: 3 - Lipid panel  3. Gastroesophageal reflux disease, unspecified whether esophagitis present Avoid spicy foods Do not eat 2 hours prior to bedtime  - pantoprazole (PROTONIX) 40 MG tablet; Take 1 tablet (40 mg total) by mouth daily.  Dispense: 90 tablet; Refill: 1  4. Peripheral edema Elevate legs when sitting  5. BMI 30.0-30.9,adult Discussed diet and exercise for person with BMI >25 Will recheck weight in 3-6 months   6. History of DVT of lower extremity Report any leg pain or swelling - apixaban (ELIQUIS) 5 MG TABS tablet; Take 1 tablet (5 mg total) by mouth 2 (two) times daily.  Dispense: 60 tablet; Refill: 5   Labs pending Health Maintenance reviewed Diet and exercise encouraged  Follow up plan: 6 months   Mary-Margaret Daphine Deutscher, FNP

## 2022-10-31 ENCOUNTER — Ambulatory Visit (INDEPENDENT_AMBULATORY_CARE_PROVIDER_SITE_OTHER): Payer: BC Managed Care – PPO | Admitting: Gastroenterology

## 2022-10-31 ENCOUNTER — Encounter (INDEPENDENT_AMBULATORY_CARE_PROVIDER_SITE_OTHER): Payer: Self-pay | Admitting: Gastroenterology

## 2022-10-31 VITALS — BP 125/72 | HR 87 | Ht 69.5 in | Wt 202.1 lb

## 2022-10-31 DIAGNOSIS — K6289 Other specified diseases of anus and rectum: Secondary | ICD-10-CM

## 2022-10-31 NOTE — Patient Instructions (Addendum)
I'm glad you are doing well!  We will plan to see you on an as needed basis if you have any new or worsening GI symptoms.  You will be due for repeat screening colonoscopy in August 2025, we will reach out to you closer to time to schedule you for this!  It was a pleasure to see you today. I want to create trusting relationships with patients and provide genuine, compassionate, and quality care. I truly value your feedback! please be on the lookout for a survey regarding your visit with me today. I appreciate your input about our visit and your time in completing this!    Christina [name] L. Alver Sorrow, MSN, APRN, AGNP-C Adult-Gerontology Nurse Practitioner [facility] [place]

## 2022-10-31 NOTE — Progress Notes (Unsigned)
Referring Provider: Chevis Pretty, * Primary Care Physician:  Chevis Pretty, FNP Primary GI Physician: Jenetta Downer   Chief Complaint  Patient presents with   Follow-up    Pt arrives for follow up. No issues/concerns at this time   HPI:   Christina [name] is a 72 y.o. female with past medical history of clotting disorder, DVT, GERD, HLD, HTN, OA, PE.   Patient presenting today for follow up.  Lat seen July 2023, at that time some rectal discomfort at times when sitting, mostly itching/burning. Denies sensation of any lesions in her rectum. No rectal pain. She also notes some burning sensation after having a BM. She is having a BM mostly every day, occasionally skips a day. Sometimes she will have to strain when this occurs, but usually she will go with ease. Saw PCP recently, diagnosed with grade 2 hemorrhoids, given anusol suppository with improvement in symptoms. Very rarely will see a swipe of blood on toilet tissue. Has occasional abdominal pain thinks related to gas. She is maintained on pantoprazole 40mg  daily, doing well on this.   Recommended increase water, fruits, veggies, witch hazel wipes, anusol suppositories PRN, OTC zinc oxide to help with itching irritation, avoid straining. Consider flex sig if symptoms persists   Present:  Patient notes that rectal itching/discomfort has resolved. She is not using any medications for this currently. She may go to the restroom daily, occasionally will miss a day and feel a little constipated but this is usually resolved with dietary changes. Denies rectal bleeding or melena. No abdominal pain. She has lost about a few pounds doing more walking. She tries to go to the gym but is not able to go everyday. PCP manages her PPI which is is doing well on. She has no GI concerns today.   Last Colonoscopy: August 2015, normal  Last Endoscopy: never  Recommendations:    Past Medical History:  Diagnosis Date   Allergic rhinitis     Clotting disorder ([place])    DVT (deep venous thrombosis) ([facility]) 1990's   DVT (deep venous thrombosis) ([place]) 1980s   in the leg behind my knee;  right    Epicondylitis    right    GERD (gastroesophageal reflux disease)    Hyperlipidemia    Hypertension    Osteoarthritis    Pulmonary embolism ([place]) 1990's    Past Surgical History:  Procedure Laterality Date   COLONOSCOPY  2015   CYSTECTOMY     From shoulder and neck   KNEE ARTHROSCOPY Left    Dr Veverly Fells   SKIN GRAFT     ulcer on right leg    TOTAL KNEE ARTHROPLASTY Right 08/23/2019   Procedure: TOTAL KNEE ARTHROPLASTY;  Surgeon: Netta Cedars, MD;  Location: [facility];  Service: Orthopedics;  Laterality: Right;   TUBAL LIGATION     Multiple   UMBILICAL HERNIA REPAIR N/A 01/27/2020   Procedure: EPIGASTRIC HERNIA REPAIR WITH MESH;  Surgeon: Virl Cagey, MD;  Location: [facility];  Service: General;  Laterality: N/A;  epicastric site    Current Outpatient Medications  Medication Sig Dispense Refill   acetaminophen (TYLENOL) 325 MG tablet Take 2 tablets (650 mg total) by mouth every 6 (six) hours as needed for mild pain (or Fever >/= 101).     amLODipine (NORVASC) 5 MG tablet Take 1 tablet (5 mg total) by mouth daily. 90 tablet 0   apixaban (ELIQUIS) 5 MG TABS tablet Take 1 tablet (5 mg total) by mouth 2 (two)  times daily. 60 tablet 5   diclofenac sodium (VOLTAREN) 1 % GEL Apply 2 g topically 3 (three) times daily as needed (knee pain).     hydrocortisone (ANUSOL-HC) 25 MG suppository Place 1 suppository (25 mg total) rectally 2 (two) times daily. 12 suppository 0   hydrocortisone-pramoxine (PROCTOFOAM-HC) rectal foam Place 1 applicator rectally 2 (two) times daily. 10 g 2   Multiple Vitamins-Minerals (MULTIVITAMIN GUMMIES WOMENS) CHEW Chew 1 each by mouth daily.     neomycin-bacitracin-polymyxin (NEOSPORIN) ointment Apply 1 application topically 2 (two) times daily. 15 g 0   pantoprazole (PROTONIX) 40 MG tablet Take 1 tablet (40 mg  total) by mouth daily. 90 tablet 1   rosuvastatin (CRESTOR) 10 MG tablet Take 1 tablet (10 mg total) by mouth daily. 90 tablet 3   No current facility-administered medications for this visit.    Allergies as of 10/31/2022 - Review Complete 10/31/2022  Allergen Reaction Noted   Ace inhibitors Cough 12/30/2010    Family History  Problem Relation Age of Onset   Heart attack Mother    Deep vein thrombosis Mother    Diabetes Mother    Heart disease Mother    Hyperlipidemia Mother    Cancer Father    Hyperlipidemia Sister    Deep vein thrombosis Daughter    Other Neg Hx        Clotting disorder    Social History   Socioeconomic History   Marital status: Married    Spouse name: Not on file   Number of children: 5   Years of education: Not on file   Highest education level: Not on file  Occupational History   Not on file  Tobacco Use   Smoking status: Former    Types: Cigarettes    Quit date: 08/16/1979    Years since quitting: 43.2    Passive exposure: Past   Smokeless tobacco: Never   Tobacco comments:    x33 years  Vaping Use   Vaping Use: Never used  Substance and Sexual Activity   Alcohol use: No    Alcohol/week: 0.0 standard drinks of alcohol   Drug use: No   Sexual activity: Not on file  Other Topics Concern   Not on file  Social History Narrative   Married.   Social Determinants of Health   Financial Resource Strain: Not on file  Food Insecurity: Not on file  Transportation Needs: Not on file  Physical Activity: Not on file  Stress: Not on file  Social Connections: Not on file    Review of systems General: negative for malaise, night sweats, fever, chills, weight loss Neck: Negative for lumps, goiter, pain and significant neck swelling Resp: Negative for cough, wheezing, dyspnea at rest CV: Negative for chest pain, leg swelling, palpitations, orthopnea GI: denies melena, hematochezia, nausea, vomiting, diarrhea, constipation, dysphagia,  odyonophagia, early satiety or unintentional weight loss.  MSK: Negative for joint pain or swelling, back pain, and muscle pain. Derm: Negative for itching or rash Psych: Denies depression, anxiety, memory loss, confusion. No homicidal or suicidal ideation.  Heme: Negative for prolonged bleeding, bruising easily, and swollen nodes. Endocrine: Negative for cold or heat intolerance, polyuria, polydipsia and goiter. Neuro: negative for tremor, gait imbalance, syncope and seizures. The remainder of the review of systems is noncontributory.  Physical Exam: BP 125/72   Pulse 87   Ht 5' 9.5" (1.765 m)   Wt 202 lb 1.6 oz (91.7 kg)   LMP  (LMP Unknown)   BMI 29.42  kg/m  General:   Alert and oriented. No distress noted. Pleasant and cooperative.  Head:  Normocephalic and atraumatic. Eyes:  Conjuctiva clear without scleral icterus. Mouth:  Oral mucosa pink and moist. Good dentition. No lesions. Heart: Normal rate and rhythm, s1 and s2 heart sounds present.  Lungs: Clear lung sounds in all lobes. Respirations equal and unlabored. Abdomen:  +BS, soft, non-tender and non-distended. No rebound or guarding. No HSM or masses noted. Derm: No palmar erythema or jaundice Msk:  Symmetrical without gross deformities. Normal posture. Extremities:  Without edema. Neurologic:  Alert and  oriented x4 Psych:  Alert and cooperative. Normal mood and affect.  Invalid input(s): "6 MONTHS"   ASSESSMENT: Caedyn Cogdill is a 72 y.o. female presenting today for follow up of rectal itching/discomfort   Rectal itching/discomfort: this has resolved with the use of anusol suppositories and witch hazel wipes, not currently having any issues or using any topicals/meds. Suspect etiology was hemorrhoids given her improvement with the above interventions. Having a BM usually most everyday. No red flag symptoms. Patient denies melena, hematochezia, nausea, vomiting, diarrhea, constipation, dysphagia, odyonophagia, early  satiety or weight loss. Will plan to see patient on as needed basis, she will make me aware of new or worsening GI symptoms.   Patient due for screening TCS August 2015, will make sure she is on the recall list for this to be scheduled closer to time.   PLAN:  Screening TCS August 2025  2. Pt to make me aware of new or worsening GI symptoms   All questions were answered, patient verbalized understanding and is in agreement with plan as outlined above.   Follow Up: PRN  Donisha Hoch L. Alver Sorrow, MSN, APRN, AGNP-C Adult-Gerontology Nurse Practitioner [facility]  I have reviewed the note and agree with the APP's assessment as described in this progress note  Maylon Peppers, MD Gastroenterology and Hepatology [facility]

## 2023-01-24 ENCOUNTER — Encounter: Payer: Self-pay | Admitting: Family

## 2023-01-24 ENCOUNTER — Ambulatory Visit: Payer: BC Managed Care – PPO | Admitting: Family

## 2023-01-24 VITALS — BP 155/84 | HR 72 | Temp 97.2°F | Ht 69.0 in | Wt 202.0 lb

## 2023-01-24 DIAGNOSIS — I872 Venous insufficiency (chronic) (peripheral): Secondary | ICD-10-CM

## 2023-01-24 DIAGNOSIS — J208 Acute bronchitis due to other specified organisms: Secondary | ICD-10-CM

## 2023-01-24 DIAGNOSIS — B9689 Other specified bacterial agents as the cause of diseases classified elsewhere: Secondary | ICD-10-CM

## 2023-01-24 DIAGNOSIS — L97311 Non-pressure chronic ulcer of right ankle limited to breakdown of skin: Secondary | ICD-10-CM | POA: Diagnosis not present

## 2023-01-24 DIAGNOSIS — I739 Peripheral vascular disease, unspecified: Secondary | ICD-10-CM

## 2023-01-24 MED ORDER — DOXYCYCLINE HYCLATE 100 MG PO TABS: 100.0000 mg | ORAL_TABLET | Freq: Two times a day (BID) | ORAL | 0 refills | Status: DC

## 2023-01-24 NOTE — Patient Instructions (Signed)
Venous Ulcer A venous ulcer is an irregular, shallow sore on your lower leg caused by poor circulation in your veins. Venous ulcer is the most common type of lower leg ulcer. You may have venous ulcers on one leg or on both legs. The area where this condition most often develops is around the ankles. A venous ulcer may last for a long time (chronic ulcer) or it may return repeatedly (recurrent ulcer). What are the causes? A venous ulcer may be caused by any condition that causes poor blood flow from your legs back to your heart. Veins have valves that help return blood to the heart. If these valves do not work properly: Blood can flow backward and pool in the lower legs. Blood can then leak out of your veins, which can irritate your skin or cause the skin to get darker over the calf/shin. Irritation or trauma can cause a break in the skin, which becomes a venous ulcer. What increases the risk? You are more likely to develop this condition if you: Are female. Are 74 years of age or older. Have had a leg ulcer in the past. Have varicose veins. Have clots in your lower leg veins (deep vein thrombosis). Have inflammation of your leg veins (phlebitis). Have had a recent pregnancy or history of multiple pregnancies. A family history of chronic venous insufficiency. Your risk may be higher if you are: Not active. Overweight. Using products that contain nicotine or tobacco. What are the signs or symptoms? The main symptom of this condition is an open sore near your ankle. Other symptoms may include: Swelling. Fluid leaking from the ulcer. Bleeding. Itching. A foul odor. Pain and swelling that gets worse when you stand up and feels better when you raise your leg. Changes in the skin, such as: The skin getting thicker. Blotchy skin. Darker skin over the calf. How is this diagnosed? Your health care provider will do a physical exam and ask about your medical history. Other tests may be done,  such as: Measuring blood pressure in your arms and legs. An ultrasound to measure blood flow in your leg veins. A CT scan to identify damaged valves. How is this treated? This condition may be treated by: Keeping your leg raised (elevated). Wearing a type of bandage (dressing) or stocking to compress the veins of your leg (compression therapy). Taking medicines to improve blood flow. Taking antibiotic medicines to treat infection. Cleaning your ulcer and removing any dead tissue from the wound (debridement). Placing various types of medicated dressings or wraps on your ulcer. Surgery to close the wound using a piece of skin taken from another area of your body (graft). This is only done for wounds that are deep or hard to heal. You may need to try different types of treatment to get your venous ulcer to heal. Healing may take a long time. You may need to see a specialist, such as a podiatrist or vascular specialist, to get testing and treatment. Follow these instructions at home: Medicines Take or apply over-the-counter and prescription medicines only as told by your provider. If you were prescribed antibiotics, take them as told by your provider. Do not stop taking your antibiotics even if you start to feel better. Ask your provider if you should take aspirin before long trips. Wound care Follow instructions from your provider about how to take care of your wound. Make sure you: Wash your hands with soap and water for at least 20 seconds before and after you change  your dressing. If soap and water are not available, use hand sanitizer. Change your dressing as told by your provider. If you had a skin graft, leave stitches (sutures) in place. These may need to stay in place for 2 weeks or longer. Ask when you should remove your dressing. If your dressing is dry and sticks to your leg when you try to remove it, moisten or wet the dressing with saline solution (salt and water) or water alone.  This is so the dressing can be removed without harming your skin or wound tissue. Check your wound every day for signs of infection. Have a caregiver do this if you are not able to do it yourself. Check for: More redness, swelling, or pain. More fluid or blood. Warmth. Pus or a bad smell. Activity Do not sit for a long time without moving. Get up to take short walks every 1-2 hours. This will improve blood flow and breathing. Ask for help if you feel weak or unsteady. Rest with your legs raised during the day. If possible, raise your legs above the level of your heart for 30 minutes, 3-4 times a day, or as told by your provider. Do not sit with your legs crossed. Return to your normal activities as told by your provider. Ask your provider what activities are safe for you. General instructions  Wear elastic stockings, compression stockings, or support hose as told by your provider. Raise the foot of your bed as told. Do not use any products that contain nicotine or tobacco. These products include cigarettes, chewing tobacco, and vaping devices, such as e-cigarettes. If you need help quitting, ask your provider. Try to eat a heart healthy and lower salt (sodium) diet. Maintain a healthy body weight. Keep all follow-up visits. Your provider will check if your ulcer is healing and change treatments if needed. Contact a health care provider if: Your ulcer is getting larger or is not healing. Your pain gets worse. You have any signs of infection. You have a fever. This information is not intended to replace advice given to you by your health care provider. Make sure you discuss any questions you have with your health care provider. Document Revised: 03/21/2022 Document Reviewed: 03/21/2022 Elsevier Patient Education  2024 [employer].

## 2023-01-24 NOTE — Progress Notes (Signed)
Subjective:    Patient ID: Christina [name], female    DOB: 1951-02-06, 72 y.o.   MRN: [id number]  Chief Complaint  Patient presents with   right ankle pain and sweilling with wound    Cough   Pt presents to the office today with ulcer on right medial ankle that she noticed 01/18/23. Reports ankle swelling, tenderness, and mild drainage.  Cough This is a new problem. The current episode started 1 to 4 weeks ago. The problem has been gradually worsening. The problem occurs every few minutes. The cough is Productive of sputum. Associated symptoms include headaches, nasal congestion and wheezing. Pertinent negatives include no chills, ear congestion, ear pain, fever or shortness of breath. She has tried rest and OTC cough suppressant for the symptoms.      Review of Systems  Constitutional:  Negative for chills and fever.  HENT:  Negative for ear pain.   Respiratory:  Positive for cough and wheezing. Negative for shortness of breath.   Neurological:  Positive for headaches.  All other systems reviewed and are negative.      Objective:   Physical Exam Vitals reviewed.  Constitutional:      General: She is not in acute distress.    Appearance: She is well-developed.  HENT:     Head: Normocephalic and atraumatic.  Eyes:     Pupils: Pupils are equal, round, and reactive to light.  Neck:     Thyroid: No thyromegaly.  Cardiovascular:     Rate and Rhythm: Normal rate and regular rhythm.     Heart sounds: Normal heart sounds. No murmur heard. Pulmonary:     Effort: Pulmonary effort is normal. No respiratory distress.     Breath sounds: Normal breath sounds. No wheezing.  Abdominal:     General: Bowel sounds are normal. There is no distension.     Palpations: Abdomen is soft.     Tenderness: There is no abdominal tenderness.  Musculoskeletal:        General: No tenderness. Normal range of motion.     Cervical back: Normal range of motion and neck supple.  Skin:     General: Skin is warm and dry.     Comments: Ulcer on right medial ankle 06X0.7 cm, no discharge  Neurological:     Mental Status: She is alert and oriented to person, place, and time.     Cranial Nerves: No cranial nerve deficit.     Deep Tendon Reflexes: Reflexes are normal and symmetric.  Psychiatric:        Behavior: Behavior normal.        Thought Content: Thought content normal.        Judgment: Judgment normal.          BP (!) 155/84   Pulse 72   Temp (!) 97.2 F (36.2 C) (Temporal)   Ht 5\' 9"  (1.753 m)   Wt 202 lb (91.6 kg)   LMP  (LMP Unknown)   SpO2 96%   BMI 29.83 kg/m   Assessment & Plan:   Daise Giovanelli comes in today with chief complaint of right ankle pain and sweilling with wound  and Cough   Diagnosis and orders addressed:  1. PAD (peripheral artery disease) (HCC) - doxycycline (VIBRA-TABS) 100 MG tablet; Take 1 tablet (100 mg total) by mouth 2 (two) times daily.  Dispense: 20 tablet; Refill: 0 - Compression stockings - Ambulatory referral to Vascular Surgery  2. Venous stasis ulcer of right ankle  limited to breakdown of skin without varicose veins (HCC) - doxycycline (VIBRA-TABS) 100 MG tablet; Take 1 tablet (100 mg total) by mouth 2 (two) times daily.  Dispense: 20 tablet; Refill: 0 - Ambulatory referral to Vascular Surgery  3. Acute bacterial bronchitis - doxycycline (VIBRA-TABS) 100 MG tablet; Take 1 tablet (100 mg total) by mouth 2 (two) times daily.  Dispense: 20 tablet; Refill: 0   Start doxycyline  Vascular referral pending  Dressing applied and coban applied Recommend she apply silver calcium alginate dressing and use compression hose daily Elevated feet Report any redness, swelling, tenderness, or fever Follow up with PCP in 1 week     Jannifer Rodney, FNP

## 2023-02-02 ENCOUNTER — Telehealth: Payer: Self-pay | Admitting: Family

## 2023-02-02 LAB — HM MAMMOGRAPHY

## 2023-02-02 NOTE — Telephone Encounter (Signed)
Pt says she recently had a visit with Neysa Bonito and was advised to put these patches on a wound that she has but says everytime she uses the patches, they stick to her skin and hurt. Needs advise on what to do.

## 2023-02-02 NOTE — Telephone Encounter (Signed)
Ok for sliver patch to stick to skin. When removing soak in water. Do not pull off dry as that will cause bleeding.

## 2023-02-02 NOTE — Telephone Encounter (Signed)
Patient is aware of message from [place], she does not want it to stick and wants to know if there is a way to avoid that.

## 2023-02-03 NOTE — Telephone Encounter (Signed)
LMTCB

## 2023-02-03 NOTE — Telephone Encounter (Signed)
She can use Vaseline gauze instead.   Jannifer Rodney, FNP

## 2023-02-09 ENCOUNTER — Encounter: Payer: Self-pay | Admitting: Nurse Practitioner

## 2023-02-09 ENCOUNTER — Ambulatory Visit: Payer: BC Managed Care – PPO | Admitting: Nurse Practitioner

## 2023-02-09 VITALS — BP 157/89 | HR 89 | Temp 97.3°F | Ht 69.0 in | Wt 203.4 lb

## 2023-02-09 DIAGNOSIS — S91001A Unspecified open wound, right ankle, initial encounter: Secondary | ICD-10-CM | POA: Insufficient documentation

## 2023-02-09 DIAGNOSIS — S91001D Unspecified open wound, right ankle, subsequent encounter: Secondary | ICD-10-CM | POA: Diagnosis not present

## 2023-02-09 MED ORDER — SULFAMETHOXAZOLE-TRIMETHOPRIM 400-80 MG PO TABS
1.0000 | ORAL_TABLET | Freq: Two times a day (BID) | ORAL | 0 refills | Status: DC
Start: 2023-02-09 — End: 2023-02-14

## 2023-02-09 NOTE — Progress Notes (Signed)
Acute Office Visit  Subjective:     Patient ID: Christina [name], female    DOB: 04/14/1951, 72 y.o.   MRN: [id number]  Chief Complaint  Patient presents with   sore on leg    Sore on right ankle. Has been there since beginning of June.     HPI Christina [name] isa 72 yrs old female seen today as an acute visit for 1 on the right ankle that is not getting better despite being on antibiotic for 10 days.  Client was seen on by her  PCP and prescribed doxycycline and referred to vascular.  Client has an upcoming appointment with vascular in August.  She reports that she done the course of antibiotic and the wound is getting worse instead of getting better. She denies fever, chills, and reports that she has been dressing the wound as instructed.  No other concerns  ROS Negative unless indicated in HPI    Objective:    BP (!) 157/89   Pulse 89   Temp (!) 97.3 F (36.3 C) (Temporal)   Ht 5\' 9"  (1.753 m)   Wt 203 lb 6.4 oz (92.3 kg)   LMP  (LMP Unknown)   SpO2 98%   BMI 30.04 kg/m  BP Readings from Last 3 Encounters:  02/09/23 (!) 157/89  01/24/23 (!) 155/84  10/31/22 125/72   Wt Readings from Last 3 Encounters:  02/09/23 203 lb 6.4 oz (92.3 kg)  01/24/23 202 lb (91.6 kg)  10/31/22 202 lb 1.6 oz (91.7 kg)      Physical Exam Vitals and nursing note reviewed.  Constitutional:      General: She is not in acute distress.    Appearance: Normal appearance.  HENT:     Head: Normocephalic and atraumatic.  Cardiovascular:     Rate and Rhythm: Normal rate.     Heart sounds: Normal heart sounds.  Pulmonary:     Effort: Pulmonary effort is normal.     Breath sounds: Normal breath sounds.  Musculoskeletal:     Right lower leg: No edema.     Left lower leg: No edema.  Skin:    General: Skin is warm and dry.     Findings: Wound present.     Comments: 1.5 x2.5 cm and 0.75 cm depth  Neurological:     General: No focal deficit present.     Mental Status: She is alert  and oriented to person, place, and time. Mental status is at baseline.  Psychiatric:        Mood and Affect: Mood normal.        Behavior: Behavior is uncooperative.        Thought Content: Thought content normal.        Judgment: Judgment normal.    1.5 x2.5 cm and 0.75 cm depth  No results found for any visits on 02/09/23.      Assessment & Plan:   Wound of right ankle, subsequent encounter -     WOUND CULTURE -     Sulfamethoxazole-Trimethoprim; Take 1 tablet by mouth 2 (two) times daily.  Dispense: 20 tablet; Refill: 0   72 yrs old female no acute distress Right ankle wound Bactrim BID for 10 days take all antibiotic until done Wound culture Wound was cleaned and dressed with Xeroform nonstick dressing F/u as already schedule with vascular  Return if symptoms worsen or fail to improve.  Arrie Aran Santa Lighter, [facility] [address]  [place], [place] [address] [phone]

## 2023-02-12 LAB — WOUND CULTURE

## 2023-02-13 NOTE — Telephone Encounter (Signed)
Unable to reach patient, encounter is 7 days old, encounter closed

## 2023-02-14 ENCOUNTER — Other Ambulatory Visit: Payer: Self-pay | Admitting: Nurse Practitioner

## 2023-02-14 LAB — WOUND CULTURE: Organism ID, Bacteria: NONE SEEN

## 2023-02-23 ENCOUNTER — Other Ambulatory Visit: Payer: Self-pay | Admitting: *Deleted

## 2023-02-23 DIAGNOSIS — R600 Localized edema: Secondary | ICD-10-CM

## 2023-02-25 ENCOUNTER — Other Ambulatory Visit: Payer: Self-pay | Admitting: Nurse Practitioner

## 2023-02-25 DIAGNOSIS — I10 Essential (primary) hypertension: Secondary | ICD-10-CM

## 2023-03-02 ENCOUNTER — Encounter: Payer: Self-pay | Admitting: Nurse Practitioner

## 2023-03-02 ENCOUNTER — Ambulatory Visit: Payer: BC Managed Care – PPO | Admitting: Nurse Practitioner

## 2023-03-02 VITALS — BP 152/89 | HR 72 | Temp 98.2°F | Resp 20 | Ht 69.0 in | Wt 202.0 lb

## 2023-03-02 DIAGNOSIS — L03115 Cellulitis of right lower limb: Secondary | ICD-10-CM

## 2023-03-02 DIAGNOSIS — L02415 Cutaneous abscess of right lower limb: Secondary | ICD-10-CM

## 2023-03-02 MED ORDER — SULFAMETHOXAZOLE-TRIMETHOPRIM 800-160 MG PO TABS: 1.0000 | ORAL_TABLET | Freq: Two times a day (BID) | ORAL | 0 refills | Status: DC

## 2023-03-02 NOTE — Patient Instructions (Signed)

## 2023-03-02 NOTE — Progress Notes (Signed)
   Subjective:    Patient ID: Christina [name], female    DOB: August 30, 1950, 72 y.o.   MRN: [id number]   Chief Complaint: Ulcer on right ankle   HPI  Ulcer on medial side of right ankle. Has been there for several weeks and will not heal. Slight drainage at times.  Patient Active Problem List   Diagnosis Date Noted   Wound of right ankle 02/09/2023   Rectal irritation 02/22/2022   Hemorrhoids 09/07/2021   Gastroesophageal reflux disease 09/11/2020   Disorder of endometrium 02/23/2020   Peripheral edema 04/13/2018   History of DVT of lower extremity 11/01/2012   Hyperlipidemia with target LDL less than 100 05/12/2010   Primary hypertension 05/12/2010       Review of Systems  Constitutional:  Negative for diaphoresis.  Eyes:  Negative for pain.  Respiratory:  Negative for shortness of breath.   Cardiovascular:  Negative for chest pain, palpitations and leg swelling.  Gastrointestinal:  Negative for abdominal pain.  Endocrine: Negative for polydipsia.  Skin:  Negative for rash.  Neurological:  Negative for dizziness, weakness and headaches.  Hematological:  Does not bruise/bleed easily.  All other systems reviewed and are negative.      Objective:   Physical Exam Constitutional:      Appearance: Normal appearance.  Cardiovascular:     Rate and Rhythm: Normal rate and regular rhythm.     Heart sounds: Normal heart sounds.  Pulmonary:     Effort: Pulmonary effort is normal.     Breath sounds: Normal breath sounds.  Skin:    Comments: 2cm annular open lesion to medial side of right ankle.   Neurological:     General: No focal deficit present.     Mental Status: She is alert and oriented to person, place, and time.  Psychiatric:        Mood and Affect: Mood normal.        Behavior: Behavior normal.    BP (!) 152/89   Pulse 72   Temp 98.2 F (36.8 C) (Temporal)   Resp 20   Ht 5\' 9"  (1.753 m)   Wt 202 lb (91.6 kg)   LMP  (LMP Unknown)   SpO2 98%   BMI  29.83 kg/m         Assessment & Plan:   Christina [name] in today with chief complaint of Ulcer on right ankle   1. Cellulitis and abscess of right leg Keep clean and dry Soak in epsom salt daily.  Meds ordered this encounter  Medications   sulfamethoxazole-trimethoprim (BACTRIM DS) 800-160 MG tablet    Sig: Take 1 tablet by mouth 2 (two) times daily.    Dispense:  20 tablet    Refill:  0    Order Specific Question:   Supervising Provider    Answer:   Arville Care A [[id number]]       The above assessment and management plan was discussed with the patient. The patient verbalized understanding of and has agreed to the management plan. Patient is aware to call the clinic if symptoms persist or worsen. Patient is aware when to return to the clinic for a follow-up visit. Patient educated on when it is appropriate to go to the emergency department.   Mary-Margaret Daphine Deutscher, FNP

## 2023-03-14 ENCOUNTER — Ambulatory Visit (HOSPITAL_COMMUNITY)
Admission: RE | Admit: 2023-03-14 | Discharge: 2023-03-14 | Disposition: A | Discharge status: Home / Self Care | Payer: BC Managed Care – PPO | Source: Ambulatory Visit | Attending: Vascular Surgery | Admitting: Vascular Surgery

## 2023-03-14 ENCOUNTER — Ambulatory Visit: Payer: BC Managed Care – PPO | Admitting: Vascular Surgery

## 2023-03-14 ENCOUNTER — Encounter: Payer: Self-pay | Admitting: Vascular Surgery

## 2023-03-14 ENCOUNTER — Encounter: Payer: Self-pay | Admitting: Nurse Practitioner

## 2023-03-14 VITALS — BP 145/82 | HR 76 | Temp 97.5°F | Resp 14 | Ht 69.5 in | Wt 201.0 lb

## 2023-03-14 DIAGNOSIS — L97909 Non-pressure chronic ulcer of unspecified part of unspecified lower leg with unspecified severity: Secondary | ICD-10-CM | POA: Diagnosis not present

## 2023-03-14 DIAGNOSIS — R600 Localized edema: Secondary | ICD-10-CM | POA: Diagnosis present

## 2023-03-14 DIAGNOSIS — I83009 Varicose veins of unspecified lower extremity with ulcer of unspecified site: Secondary | ICD-10-CM | POA: Diagnosis not present

## 2023-03-14 LAB — VAS US ABI WITH/WO TBI
Left ABI: 1.2
Right ABI: 1.38

## 2023-03-14 NOTE — Progress Notes (Signed)
Patient name: Christina [name] MRN: [id number] DOB: 08-Sep-1950 Sex: female  REASON FOR CONSULT: PAD with ulcer   HPI: Christina [name] is a 72 y.o. female, with hx HTN, HLD, DVT/PE who presents for evaluation of PAD with ulcer.  Patient states she has an ulcer on her right ankle that has been present since before June.  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  She has been seen by our practice in the past with venous ulcers.  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  States she was ultimately referred to [facility] in the 90s and had a workup with hematology and was told she needed to be on anticoagulation for life and now on Eliquis.  Last saw Dr. Myra Gianotti in 2016 and had some reflux in the small saphenous vein but he thought she was likely too high risk for intervention given her thromboembolic history.    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   Procedure: TOTAL KNEE ARTHROPLASTY;  Surgeon: Beverely Low, MD;  Location: [facility];  Service: Orthopedics;  Laterality: Right;   TUBAL LIGATION     Multiple   UMBILICAL HERNIA REPAIR N/A 01/27/2020   Procedure: EPIGASTRIC HERNIA REPAIR WITH MESH;  Surgeon: Lucretia Roers, MD;  Location: [facility];  Service: General;  Laterality: N/A;  epicastric site    Family History  Problem Relation Age of Onset   Heart attack Mother     Deep vein thrombosis Mother    Diabetes Mother    Heart disease Mother    Hyperlipidemia Mother    Cancer Father    Hyperlipidemia Sister    Deep vein thrombosis Daughter    Other Neg Hx        Clotting disorder    SOCIAL HISTORY: Social History   Socioeconomic History   Marital status: Married    Spouse name: Not on file   Number of children: 5   Years of education: Not on file   Highest education level: Not on file  Occupational History   Not on file  Tobacco Use   Smoking status: Former    Current packs/day: 0.00    Types: Cigarettes    Quit date: 08/16/1979    Years since quitting: 43.6    Passive exposure: Past   Smokeless tobacco: Never   Tobacco comments:    x33 years  Vaping Use   Vaping status: Never Used  Substance and Sexual Activity   Alcohol use: No    Alcohol/week: 0.0 standard drinks of alcohol   Drug use: No   Sexual activity: Not on file  Other Topics Concern   Not on file  Social History Narrative   Married.  Social Determinants of Health   Financial Resource Strain: Not on file  Food Insecurity: Not on file  Transportation Needs: Not on file  Physical Activity: Sufficiently Active (01/25/2023)   Received from [facility], [facility]   Exercise Vital Sign    Days of Exercise per Week: 3 days    Minutes of Exercise per Session: 70 min  Stress: No Stress Concern Present (01/25/2023)   Received from [facility], [facility] - Occupational Stress Questionnaire    Feeling of Stress : Not at all  Social Connections: Not on file  Intimate Partner Violence: Not At Risk (01/25/2023)   Received from [facility], [facility]   Humiliation, Afraid, Rape, and Kick questionnaire    Fear of Current or Ex-Partner: No    Emotionally Abused: No    Physically Abused: No    Sexually Abused: No    Allergies  Allergen Reactions   Ace Inhibitors Cough         Current Outpatient  Medications  Medication Sig Dispense Refill   acetaminophen (TYLENOL) 325 MG tablet Take 2 tablets (650 mg total) by mouth every 6 (six) hours as needed for mild pain (or Fever >/= 101).     amLODipine (NORVASC) 5 MG tablet Take 1 tablet (5 mg total) by mouth daily. (NEEDS TO BE SEEN BEFORE NEXT REFILL) 30 tablet 0   apixaban (ELIQUIS) 5 MG TABS tablet Take 1 tablet (5 mg total) by mouth 2 (two) times daily. 60 tablet 5   diclofenac sodium (VOLTAREN) 1 % GEL Apply 2 g topically 3 (three) times daily as needed (knee pain).     hydrocortisone (ANUSOL-HC) 25 MG suppository Place 1 suppository (25 mg total) rectally 2 (two) times daily. 12 suppository 0   hydrocortisone-pramoxine (PROCTOFOAM-HC) rectal foam Place 1 applicator rectally 2 (two) times daily. 10 g 2   Multiple Vitamins-Minerals (MULTIVITAMIN GUMMIES WOMENS) CHEW Chew 1 each by mouth daily.     pantoprazole (PROTONIX) 40 MG tablet Take 1 tablet (40 mg total) by mouth daily. 90 tablet 1   rosuvastatin (CRESTOR) 10 MG tablet Take 1 tablet (10 mg total) by mouth daily. 90 tablet 3   sulfamethoxazole-trimethoprim (BACTRIM DS) 800-160 MG tablet Take 1 tablet by mouth 2 (two) times daily. 20 tablet 0   No current facility-administered medications for this visit.    REVIEW OF SYSTEMS:  [X]  denotes positive finding, [ ]  denotes negative finding Cardiac  Comments:  Chest pain or chest pressure:    Shortness of breath upon exertion:    Short of breath when lying flat:    Irregular heart rhythm:        Vascular    Pain in calf, thigh, or hip brought on by ambulation:    Pain in feet at night that wakes you up from your sleep:     Blood clot in your veins:    Leg swelling:         Pulmonary    Oxygen at home:    Productive cough:     Wheezing:         Neurologic    Sudden weakness in arms or legs:     Sudden numbness in arms or legs:     Sudden onset of difficulty speaking or slurred speech:    Temporary loss of vision in one eye:      Problems with dizziness:  Gastrointestinal    Blood in stool:     Vomited blood:         Genitourinary    Burning when urinating:     Blood in urine:        Psychiatric    Major depression:         Hematologic    Bleeding problems:    Problems with blood clotting too easily:        Skin    Rashes or ulcers:        Constitutional    Fever or chills:      PHYSICAL EXAM: There were no vitals filed for this visit.  GENERAL: The patient is a well-nourished female, in no acute distress. The vital signs are documented above. CARDIAC: There is a regular rate and rhythm.  VASCULAR:  Bilateral femoral pulses palpable Bilateral DP pulses palpable Right leg venous ulcer as pictured at the ankle PULMONARY: No respiratory distress. ABDOMEN: Soft and non-tender. MUSCULOSKELETAL: There are no major deformities or cyanosis. NEUROLOGIC: No focal weakness or paresthesias are detected. PSYCHIATRIC: The patient has a normal affect.    DATA:   ABI's today are 1.38 right and 1.20 left both triphasic and normal  Assessment/Plan:  72 y.o. female, with hx HTN, HLD, DVT/PE who presents for evaluation of PAD with right leg ulcer.  Discussed that her clinical presentation is consistent with a venous ulcer.  She does have a history of 2 DVTs in the right leg and is on chronic anticoagulation and I suspect her previous DVTs have led to valvular damage and significant venous reflux.  We will place her in Dewy Rose boot today and have ordered home health for weekly Unna boot changes.  I will bring her back for reflux study of her right leg to see if she has any options for venous intervention.  I do not think she has any significant arterial insufficiency as she has normal ABIs and palpable pulses in the foot distal to the ulcer.  I discussed leg elevation in addition to [employer] changes.     Cephus Shelling, MD [facility] Office: [phone]

## 2023-03-15 ENCOUNTER — Other Ambulatory Visit: Payer: Self-pay | Admitting: *Deleted

## 2023-03-15 DIAGNOSIS — R600 Localized edema: Secondary | ICD-10-CM

## 2023-03-15 DIAGNOSIS — I83009 Varicose veins of unspecified lower extremity with ulcer of unspecified site: Secondary | ICD-10-CM

## 2023-03-20 ENCOUNTER — Telehealth: Payer: Self-pay | Admitting: Nurse Practitioner

## 2023-03-20 NOTE — Telephone Encounter (Signed)
Needs appointment

## 2023-03-20 NOTE — Telephone Encounter (Signed)
Pt called and was told to call us back and tell us she needs a Print production planner

## 2023-03-20 NOTE — Telephone Encounter (Signed)
Patient aware.  She is going to call vascular first and if they can not do it she will call and schedule with MMM.

## 2023-03-20 NOTE — Telephone Encounter (Signed)
Left detailed message on patients voicemail to contact the vascular office first and follow up on what they had planned. Advised to contact our office back if she doesn't get any response

## 2023-03-21 ENCOUNTER — Telehealth: Payer: Self-pay

## 2023-03-21 NOTE — Telephone Encounter (Signed)
Received call from [facility] - due to nurse being sick pt's SOC would not be until next week. Pt scheduled to have unna boot changed in office on tomorrow. - contacted pt and made aware.

## 2023-03-22 ENCOUNTER — Other Ambulatory Visit: Payer: Self-pay | Admitting: Nurse Practitioner

## 2023-03-22 ENCOUNTER — Ambulatory Visit: Payer: BC Managed Care – PPO | Admitting: Physician Assistant

## 2023-03-22 VITALS — BP 144/86 | HR 72 | Temp 98.0°F | Wt 202.0 lb

## 2023-03-22 DIAGNOSIS — L97909 Non-pressure chronic ulcer of unspecified part of unspecified lower leg with unspecified severity: Secondary | ICD-10-CM | POA: Diagnosis not present

## 2023-03-22 DIAGNOSIS — I83013 Varicose veins of right lower extremity with ulcer of ankle: Secondary | ICD-10-CM | POA: Diagnosis not present

## 2023-03-22 DIAGNOSIS — I10 Essential (primary) hypertension: Secondary | ICD-10-CM

## 2023-03-22 NOTE — Progress Notes (Signed)
   Ms. Christina [name] is a 72 year old female with a venous ulcer on her right medial ankle.  Unna boot was placed today.  Going forward [employer] changes will be performed by home health nursing on a weekly basis.  Dr. Chestine Spore ordered a reflux study of the right lower extremity which will be performed at a later date.  No evidence of arterial insufficiency with palpable DP pulses and normal ABIs.  She will call/return office with any questions or concerns.  Emilie Rutter, PA-C [facility] [phone] 03/22/2023  3:25 PM

## 2023-03-26 ENCOUNTER — Other Ambulatory Visit: Payer: Self-pay | Admitting: Nurse Practitioner

## 2023-03-26 DIAGNOSIS — I10 Essential (primary) hypertension: Secondary | ICD-10-CM

## 2023-03-27 NOTE — Telephone Encounter (Signed)
MMM pt NTBS 30-d given 02/27/23

## 2023-03-28 ENCOUNTER — Ambulatory Visit: Payer: BC Managed Care – PPO | Admitting: Nurse Practitioner

## 2023-03-28 ENCOUNTER — Encounter: Payer: Self-pay | Admitting: Nurse Practitioner

## 2023-03-28 VITALS — BP 151/89 | HR 70 | Temp 97.7°F | Resp 20 | Ht 69.0 in | Wt 204.0 lb

## 2023-03-28 DIAGNOSIS — K219 Gastro-esophageal reflux disease without esophagitis: Secondary | ICD-10-CM | POA: Diagnosis not present

## 2023-03-28 DIAGNOSIS — R600 Localized edema: Secondary | ICD-10-CM | POA: Diagnosis not present

## 2023-03-28 DIAGNOSIS — L97311 Non-pressure chronic ulcer of right ankle limited to breakdown of skin: Secondary | ICD-10-CM

## 2023-03-28 DIAGNOSIS — E785 Hyperlipidemia, unspecified: Secondary | ICD-10-CM | POA: Diagnosis not present

## 2023-03-28 DIAGNOSIS — I872 Venous insufficiency (chronic) (peripheral): Secondary | ICD-10-CM

## 2023-03-28 DIAGNOSIS — Z6829 Body mass index (BMI) 29.0-29.9, adult: Secondary | ICD-10-CM

## 2023-03-28 DIAGNOSIS — I10 Essential (primary) hypertension: Secondary | ICD-10-CM

## 2023-03-28 DIAGNOSIS — Z86718 Personal history of other venous thrombosis and embolism: Secondary | ICD-10-CM

## 2023-03-28 MED ORDER — ROSUVASTATIN CALCIUM 10 MG PO TABS
10.0000 mg | ORAL_TABLET | Freq: Every day | ORAL | 3 refills | Status: DC
Start: 2023-03-28 — End: 2023-09-26

## 2023-03-28 MED ORDER — PANTOPRAZOLE SODIUM 40 MG PO TBEC
40.0000 mg | DELAYED_RELEASE_TABLET | Freq: Every day | ORAL | 1 refills | Status: DC
Start: 2023-03-28 — End: 2023-09-26

## 2023-03-28 MED ORDER — APIXABAN 5 MG PO TABS
5.0000 mg | ORAL_TABLET | Freq: Two times a day (BID) | ORAL | 5 refills | Status: DC
Start: 2023-03-28 — End: 2023-09-26

## 2023-03-28 MED ORDER — AMLODIPINE BESYLATE 5 MG PO TABS
5.0000 mg | ORAL_TABLET | Freq: Every day | ORAL | 0 refills | Status: DC
Start: 2023-03-28 — End: 2023-04-26

## 2023-03-28 NOTE — Patient Instructions (Signed)
Venous Ulcer A venous ulcer is a shallow sore on your lower leg. Venous ulcer is the most common type of lower leg ulcer. You may have venous ulcers on one leg or on both legs. This condition most often develops around your ankles. This type of ulcer may last for a long time (chronic ulcer) or it may return often (recurrent ulcer). What are the causes? This condition is caused by poor blood flow in your legs. The poor flow causes blood to pool in your legs. This can break the skin, causing an ulcer. What increases the risk? You are more likely to develop this condition if: You are female. You are 24 years of age or older. You have had a leg ulcer in the past. You have varicose veins. You have clots in your lower leg veins (deep vein thrombosis). You have irritation and swelling (inflammation) of your leg veins (phlebitis). You have recently been pregnant. You have a family history of chronic venous insufficiency. This is a condition where the leg veins do not pump enough blood from the legs to the heart. Your risk may be higher if: You are not active. You are overweight. You smoke. What are the signs or symptoms? The main symptom of this condition is an open sore near your ankle. Other symptoms may include: Swelling. Fluid coming from the ulcer. Bleeding. Itching. Pain and swelling. This gets worse when you stand up and feels better when you raise your leg. Changes in the skin, such as: Thick skin. Blotchy skin. Dark skin. How is this treated? Treatments include: Keeping your leg raised (elevated). Wearing a type of bandage or stocking to keep pressure (compression) on the veins of your leg. Taking medicines, including antibiotic medicines. Cleaning your ulcer and removing any dead tissue from the wound. Using bandages and wraps that have medicines in them to cover your ulcer. Closing the wound using a piece of skin taken from another area of your body (graft). Healing may take a  long time. You may need to see a foot doctor or a vein specialist. Follow these instructions at home: Medicines Take or apply over-the-counter and prescription medicines only as told by your doctor. If you were prescribed antibiotics, take them as told by your doctor. Do not stop taking them even if you start to feel better. Ask your doctor if you should take aspirin before long trips. Wound care Follow instructions from your doctor about how to take care of your wound. Make sure you: Wash your hands with soap and water for at least 20 seconds before and after you change your bandage. If you cannot use soap and water, use hand sanitizer. Change your bandage. Leave stitches or skin glue in place for at least 2 weeks. Leave tape strips alone unless you are told to take them off. You may trim the edges of the tape strips if they curl up. Ask when you should remove your bandage. If your bandage is dry and sticks to your leg when you try to remove it, moisten or wet the bandage with saline solution or water alone to make it easier to remove. Check your wound every day for signs of infection. Have a caregiver do this for you if you are not able to do it yourself. Check for: More redness, swelling, or pain. More fluid or blood. Warmth. Pus or a bad smell. Activity Get up to take short walks every 1 to 2 hours. Ask for help if you feel weak or unsteady.  Rest with your legs raised during the day. If you can, keep your legs above the level of your heart for 30 minutes, 3-4 times a day, or as told by your doctor. Do not sit with your legs crossed. Return to your normal activities when your doctor says that it is safe. General instructions  Wear elastic stockings, compression stockings, or support hose as told by your doctor. Raise the foot of your bed as told by your doctor. Do not smoke or use any products that contain nicotine or tobacco. If you need help quitting, ask your doctor. Try to eat a  heart healthy and low salt diet. Keep a healthy body weight. Keep all follow-up visits. Your doctor will check if your ulcer is healing and change treatments if needed. Contact a doctor if: Your ulcer is getting larger or is not healing. Your pain gets worse. You have any signs of infection. You have a fever. This information is not intended to replace advice given to you by your health care provider. Make sure you discuss any questions you have with your health care provider. Document Revised: 03/21/2022 Document Reviewed: 03/21/2022 Elsevier Patient Education  2024 [employer].

## 2023-03-28 NOTE — Progress Notes (Signed)
Subjective:    Patient ID: Christina [name], female    DOB: [birth year], 72 y.o.   MRN: [id number]   Chief Complaint: medical management of chronic issues     HPI:  Christina [name] is a 72 y.o. who identifies as a female who was assigned female at birth.   Social history: Lives with: husband Work history: retired Clinical cytogeneticist.   Comes in today for follow up of the following chronic medical issues:  1. Primary hypertension No c/o chest pain, sob or headache. Does not check blood pressure at home. BP Readings from Last 3 Encounters:  03/22/23 (!) 144/86  03/14/23 (!) 145/82  03/02/23 (!) 152/89     2. Hyperlipidemia with target LDL less than 100 Does not watch diet and does no dedicated exercise.  Lab Results  Component Value Date   CHOL 249 (H) 03/08/2022   HDL 80 03/08/2022   LDLCALC 154 (H) 03/08/2022   TRIG 91 03/08/2022   CHOLHDL 3.1 03/08/2022     3. Gastroesophageal reflux disease, unspecified whether esophagitis present Is on protonix and is symptomatic if she does not take meds.  4. Peripheral edema Has daily by the end of the day.  5. Venous stasis ulcer of right ankle limited to breakdown of skin without varicose veins (HCC) She is seeing vascular surgeon. She last saw him on 03/22/23. They applied unna boot and she Is having that changed by home health. She is being scheduled for a reflux study. Christina [name] is no evidence of arterial insufficiency. Patient said that she could not stand wrap any longer and she took it off yesterday. Home health is coming tomorrow to put a new one on her.   6. BMI 29.0-29.9,adult No recent weight changes Wt Readings from Last 3 Encounters:  03/28/23 204 lb (92.5 kg)  03/22/23 202 lb (91.6 kg)  03/14/23 201 lb (91.2 kg)   BMI Readings from Last 3 Encounters:  03/28/23 30.13 kg/m  03/22/23 29.40 kg/m  03/14/23 29.26 kg/m     New complaints: None today  Allergies  Allergen  Reactions   Ace Inhibitors Cough        Outpatient Encounter Medications as of 03/28/2023  Medication Sig   acetaminophen (TYLENOL) 325 MG tablet Take 2 tablets (650 mg total) by mouth every 6 (six) hours as needed for mild pain (or Fever >/= 101).   amLODipine (NORVASC) 5 MG tablet Take 1 tablet (5 mg total) by mouth daily. (NEEDS TO BE SEEN BEFORE NEXT REFILL)   apixaban (ELIQUIS) 5 MG TABS tablet Take 1 tablet (5 mg total) by mouth 2 (two) times daily.   diclofenac sodium (VOLTAREN) 1 % GEL Apply 2 g topically 3 (three) times daily as needed (knee pain).   Multiple Vitamins-Minerals (MULTIVITAMIN GUMMIES WOMENS) CHEW Chew 1 each by mouth daily.   pantoprazole (PROTONIX) 40 MG tablet Take 1 tablet (40 mg total) by mouth daily.   rosuvastatin (CRESTOR) 10 MG tablet Take 1 tablet (10 mg total) by mouth daily.   No facility-administered encounter medications on file as of 03/28/2023.    Past Surgical History:  Procedure Laterality Date   COLONOSCOPY  2015   CYSTECTOMY     From shoulder and neck   KNEE ARTHROSCOPY Left    Dr Ranell Patrick   SKIN GRAFT     ulcer on right leg    TOTAL KNEE ARTHROPLASTY Right 08/23/2019   Procedure: TOTAL KNEE ARTHROPLASTY;  Surgeon: Beverely Low, MD;  Location: [facility];  Service: Orthopedics;  Laterality: Right;   TUBAL LIGATION     Multiple   UMBILICAL HERNIA REPAIR N/A 01/27/2020   Procedure: EPIGASTRIC HERNIA REPAIR WITH MESH;  Surgeon: Lucretia Roers, MD;  Location: [facility];  Service: General;  Laterality: N/A;  epicastric site    Family History  Problem Relation Age of Onset   Heart attack Mother    Deep vein thrombosis Mother    Diabetes Mother    Heart disease Mother    Hyperlipidemia Mother    Cancer Father    Hyperlipidemia Sister    Deep vein thrombosis Daughter    Other Neg Hx        Clotting disorder      Controlled substance contract: n/a     Review of Systems  Constitutional:  Negative for diaphoresis.  Eyes:  Negative for  pain.  Respiratory:  Negative for shortness of breath.   Cardiovascular:  Negative for chest pain, palpitations and leg swelling.  Gastrointestinal:  Negative for abdominal pain.  Endocrine: Negative for polydipsia.  Skin:  Negative for rash.  Neurological:  Negative for dizziness, weakness and headaches.  Hematological:  Does not bruise/bleed easily.  All other systems reviewed and are negative.      Objective:   Physical Exam Vitals and nursing note reviewed.  Constitutional:      General: She is not in acute distress.    Appearance: Normal appearance. She is well-developed.  HENT:     Head: Normocephalic.     Right Ear: Tympanic membrane normal.     Left Ear: Tympanic membrane normal.     Nose: Nose normal.     Mouth/Throat:     Mouth: Mucous membranes are moist.  Eyes:     Pupils: Pupils are equal, round, and reactive to light.  Neck:     Vascular: No carotid bruit or JVD.  Cardiovascular:     Rate and Rhythm: Normal rate and regular rhythm.     Heart sounds: Normal heart sounds.  Pulmonary:     Effort: Pulmonary effort is normal. No respiratory distress.     Breath sounds: Normal breath sounds. No wheezing or rales.  Chest:     Chest wall: No tenderness.  Abdominal:     General: Bowel sounds are normal. There is no distension or abdominal bruit.     Palpations: Abdomen is soft. There is no hepatomegaly, splenomegaly, mass or pulsatile mass.     Tenderness: There is no abdominal tenderness.  Musculoskeletal:        General: Normal range of motion.     Cervical back: Normal range of motion and neck supple.  Lymphadenopathy:     Cervical: No cervical adenopathy.  Skin:    General: Skin is warm and dry.     Comments: See attached photo of venous stasis ulcer  Neurological:     Mental Status: She is alert and oriented to person, place, and time.     Deep Tendon Reflexes: Reflexes are normal and symmetric.  Psychiatric:        Behavior: Behavior normal.         Thought Content: Thought content normal.        Judgment: Judgment normal.    BP (!) 151/89   Pulse 70   Temp 97.7 F (36.5 C) (Temporal)   Resp 20   Ht 5\' 9"  (1.753 m)   Wt 204 lb (92.5 kg)   LMP  (LMP Unknown)  SpO2 99%   BMI 30.13 kg/m         Assessment & Plan:  Christina [name] comes in today with chief complaint of Medical Management of Chronic Issues   Diagnosis and orders addressed:  1. Primary hypertension Low sodium diet - amLODipine (NORVASC) 5 MG tablet; Take 1 tablet (5 mg total) by mouth daily. (NEEDS TO BE SEEN BEFORE NEXT REFILL)  Dispense: 30 tablet; Refill: 0 - CBC with Differential/Platelet - CMP14+EGFR  2. Hyperlipidemia with target LDL less than 100 Low fat diet - rosuvastatin (CRESTOR) 10 MG tablet; Take 1 tablet (10 mg total) by mouth daily.  Dispense: 90 tablet; Refill: 3 - Lipid panel  3. Gastroesophageal reflux disease, unspecified whether esophagitis present Avoid spicy foods Do not eat 2 hours prior to bedtime  - pantoprazole (PROTONIX) 40 MG tablet; Take 1 tablet (40 mg total) by mouth daily.  Dispense: 90 tablet; Refill: 1  4. Peripheral edema Elevate legs when sitting  5. Venous stasis ulcer of right ankle limited to breakdown of skin without varicose veins (HCC) Keep follow up with vascular surgeon Make sure home health comes to reapply unna boot  6. BMI 29.0-29.9,adult Discussed diet and exercise for person with BMI >25 Will recheck weight in 3-6 months   7. History of DVT of lower extremity Report any bleeding issues - apixaban (ELIQUIS) 5 MG TABS tablet; Take 1 tablet (5 mg total) by mouth 2 (two) times daily.  Dispense: 60 tablet; Refill: 5   Labs pending Health Maintenance reviewed Diet and exercise encouraged  Follow up plan: 6 months   Mary-Margaret Daphine Deutscher, FNP

## 2023-04-10 ENCOUNTER — Ambulatory Visit (HOSPITAL_COMMUNITY)
Admission: RE | Admit: 2023-04-10 | Discharge: 2023-04-10 | Disposition: A | Discharge status: Home / Self Care | Payer: BC Managed Care – PPO | Source: Ambulatory Visit | Attending: Surgery | Admitting: Surgery

## 2023-04-10 DIAGNOSIS — R600 Localized edema: Secondary | ICD-10-CM

## 2023-04-10 DIAGNOSIS — I83009 Varicose veins of unspecified lower extremity with ulcer of unspecified site: Secondary | ICD-10-CM

## 2023-04-10 DIAGNOSIS — I82511 Chronic embolism and thrombosis of right femoral vein: Secondary | ICD-10-CM

## 2023-04-10 DIAGNOSIS — I82591 Chronic embolism and thrombosis of other specified deep vein of right lower extremity: Secondary | ICD-10-CM | POA: Diagnosis not present

## 2023-04-10 DIAGNOSIS — L97909 Non-pressure chronic ulcer of unspecified part of unspecified lower leg with unspecified severity: Secondary | ICD-10-CM

## 2023-04-11 ENCOUNTER — Ambulatory Visit (INDEPENDENT_AMBULATORY_CARE_PROVIDER_SITE_OTHER): Payer: BC Managed Care – PPO | Admitting: Physician Assistant

## 2023-04-11 VITALS — BP 142/85 | HR 81 | Temp 98.6°F | Resp 18 | Ht 69.0 in | Wt 205.9 lb

## 2023-04-11 DIAGNOSIS — I83009 Varicose veins of unspecified lower extremity with ulcer of unspecified site: Secondary | ICD-10-CM | POA: Diagnosis not present

## 2023-04-11 DIAGNOSIS — L97909 Non-pressure chronic ulcer of unspecified part of unspecified lower leg with unspecified severity: Secondary | ICD-10-CM

## 2023-04-11 DIAGNOSIS — I872 Venous insufficiency (chronic) (peripheral): Secondary | ICD-10-CM | POA: Diagnosis not present

## 2023-04-12 NOTE — Progress Notes (Signed)
Office Note   History of Present Illness   Christina [name] is a 72 y.o. (10-23-1950) female who presents for repeat evaluation of right lower extremity swelling and ulceration. She has an ulceration on her right medial ankle that has been present since before June. This has required multiple unna boot changes, now being managed by home health care nurses.   She returns with venous reflux studies. She does have issues with swelling in the right leg. 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   She has a prior history of multiple RLE DVTs.   Her right ankle ulceration is greatly improving with regular unna boot changes. Her next change is this Friday.  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   Procedure: TOTAL KNEE ARTHROPLASTY;  Surgeon: Beverely Low, MD;  Location: [facility];  Service: Orthopedics;  Laterality: Right;   TUBAL LIGATION     Multiple   UMBILICAL HERNIA REPAIR N/A 01/27/2020   Procedure: EPIGASTRIC HERNIA REPAIR WITH MESH;  Surgeon: Lucretia Roers, MD;  Location: [facility];  Service: General;  Laterality: N/A;  epicastric site    Social History   Socioeconomic History   Marital status: Married    Spouse name: Not on file   Number of children: 5   Years of education: Not on file   Highest education level: Not on file  Occupational History   Not on file   Tobacco Use   Smoking status: Former    Current packs/day: 0.00    Types: Cigarettes    Quit date: 08/16/1979    Years since quitting: 43.6    Passive exposure: Past   Smokeless tobacco: Never   Tobacco comments:    x33 years  Vaping Use   Vaping status: Never Used  Substance and Sexual Activity   Alcohol use: No    Alcohol/week: 0.0 standard drinks of alcohol   Drug use: No   Sexual activity: Not on file  Other Topics Concern   Not on file  Social History Narrative   Married.   Social Determinants of Health   Financial Resource Strain: Not on file  Food Insecurity: Not on file  Transportation Needs: Not on file  Physical Activity: Sufficiently Active (01/25/2023)   Received from [facility], [facility]   Exercise Vital Sign    Days of Exercise per Week: 3 days    Minutes of Exercise per Session: 70 min  Stress: No Stress Concern Present (01/25/2023)   Received from [facility], [facility] - Occupational Stress Questionnaire    Feeling of Stress : Not at all  Social Connections: Not on file  Intimate Partner Violence: Not At Risk (01/25/2023)   Received from [facility], [facility]   Humiliation, Afraid, Rape, and Kick questionnaire    Fear of Current or Ex-Partner: No    Emotionally Abused: No    Physically Abused: No    Sexually Abused: No    Family History  Problem Relation Age of Onset   Heart attack Mother    Deep vein thrombosis Mother    Diabetes Mother    Heart disease Mother    Hyperlipidemia Mother    Cancer Father    Hyperlipidemia Sister    Deep vein thrombosis Daughter    Other Neg Hx        Clotting disorder    Current Outpatient Medications  Medication Sig Dispense Refill   acetaminophen (TYLENOL) 325 MG tablet Take 2 tablets (650 mg total) by mouth every 6 (six) hours as needed for mild pain (or Fever >/= 101).     amLODipine (NORVASC) 5 MG tablet Take 1 tablet (5 mg  total) by mouth daily. (NEEDS TO BE SEEN BEFORE NEXT REFILL) 30 tablet 0   apixaban (ELIQUIS) 5 MG TABS tablet Take 1 tablet (5 mg total) by mouth 2 (two) times daily. 60 tablet 5   diclofenac sodium (VOLTAREN) 1 % GEL Apply 2 g topically 3 (three) times daily as needed (knee pain).     Multiple Vitamins-Minerals (MULTIVITAMIN GUMMIES WOMENS) CHEW Chew 1 each by mouth daily.     pantoprazole (PROTONIX) 40 MG tablet Take 1 tablet (40 mg total) by mouth daily. 90 tablet 1   rosuvastatin (CRESTOR) 10 MG tablet Take 1 tablet (10 mg total) by mouth daily. 90 tablet 3   No current facility-administered medications for this visit.    Allergies  Allergen Reactions   Ace Inhibitors Cough         REVIEW OF SYSTEMS (negative unless checked):   Cardiac:  []  Chest pain or chest pressure? []  Shortness of breath upon activity? []  Shortness of breath when lying flat? []  Irregular heart rhythm?  Vascular:  []  Pain in calf, thigh, or hip brought on by walking? []  Pain in feet at night that wakes you up from your sleep? []  Blood clot in your veins? [x]  Leg swelling?  Pulmonary:  []  Oxygen at home? []  Productive cough? []  Wheezing?  Neurologic:  []  Sudden weakness in arms or legs? []  Sudden numbness in arms or legs? []  Sudden onset of difficult speaking or slurred speech? []  Temporary loss of vision in one eye? []  Problems with dizziness?  Gastrointestinal:  []  Blood in stool? []  Vomited blood?  Genitourinary:  []  Burning when urinating? []  Blood in urine?  Psychiatric:  []  Major depression  Hematologic:  []  Bleeding problems? []  Problems with blood clotting?  Dermatologic:  []  Rashes or ulcers?  Constitutional:  []  Fever or chills?  Ear/Nose/Throat:  []  Change in hearing? []  Nose bleeds? []  Sore throat?  Musculoskeletal:  []  Back pain? []  Joint pain? []  Muscle pain?   Physical Examination     Vitals:   04/11/23 1434  BP: (!) 142/85  Pulse: 81  Resp: 18   Temp: 98.6 F (37 C)  TempSrc: Temporal  SpO2: 98%  Weight: 205 lb 14.4 oz (93.4 kg)  Height: 5\' 9"  (1.753 m)   Body mass index is 30.41 kg/m.  General:  WDWN in NAD; vital signs documented above Gait: Not observed HENT: WNL, normocephalic Pulmonary: normal non-labored breathing ,  without Rales, rhonchi,  wheezing Cardiac: regular Abdomen: soft, NT, no masses Skin: without rashes Vascular Exam/Pulses: BLE warm and well perfused Extremities: RLE unna boot Musculoskeletal: no muscle wasting or atrophy  Neurologic: A&O X 3;  No focal weakness or paresthesias are detected Psychiatric:  The pt has Normal affect.  Non-invasive Vascular Imaging   RLE Venous Insufficiency Duplex (04/10/2023): +--------------+---------+------+-----------+------------+----------------+   RIGHT        Reflux NoRefluxReflux TimeDiameter cmsComments                                  Yes                                            +--------------+---------+------+-----------+------------+----------------+   CFV                    yes   >1 second                                +--------------+---------+------+-----------+------------+----------------+   FV prox       no                                                       +--------------+---------+------+-----------+------------+----------------+   FV mid        no                                                       +--------------+---------+------+-----------+------------+----------------+   FV dist                 yes   >1 second             chronic  thrombus  +--------------+---------+------+-----------+------------+----------------+   Popliteal              yes   >1 second             chronic  thrombus  +--------------+---------+------+-----------+------------+----------------+   GSV at [facility]    no                            0.89                        +--------------+---------+------+-----------+------------+----------------+   GSV prox thighno                            0.59    branching          +--------------+---------+------+-----------+------------+----------------+   GSV mid thigh no                            0.57                       +--------------+---------+------+-----------+------------+----------------+   GSV dist thighno  0.55                       +--------------+---------+------+-----------+------------+----------------+   GSV at knee   no                            0.54    branching          +--------------+---------+------+-----------+------------+----------------+   GSV prox calf no                            0.54    branching          +--------------+---------+------+-----------+------------+----------------+   SSV Pop Fossa no                            0.34    tortuous           +--------------+---------+------+-----------+------------+----------------+   SSV prox calf no                            0.35    tortuous           +--------------+---------+------+-----------+------------+----------------+    Medical Decision Making   Christina [name] is a 72 y.o. female who presents with RLE ulceration  Based on the patient's duplex, there is reflux in the right common femoral vein, distal femoral vein, and popliteal vein. The remainder of the deep and superficial venous system is competent. 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 Given that she has no notable superficial reflux, she is not a candidate for ablation I have recommended she continue to exercise and elevate her legs to help with leg swelling. She will continue to get unna boot changes to her right leg until her medial ankle wound heals. After the wound heals, she should wear compression stockings daily. She can follow up with  our office as needed   Ernestene Mention, PA-C [facility] Office: [phone]  04/11/2023, 2:59 PM  Clinic MD: Steve Rattler

## 2023-04-26 ENCOUNTER — Other Ambulatory Visit: Payer: Self-pay | Admitting: Nurse Practitioner

## 2023-04-26 DIAGNOSIS — I10 Essential (primary) hypertension: Secondary | ICD-10-CM

## 2023-05-02 ENCOUNTER — Telehealth: Payer: Self-pay

## 2023-05-02 NOTE — Telephone Encounter (Signed)
Called pt to get their most recent blood pressure reading. She will call back with it

## 2023-09-21 ENCOUNTER — Ambulatory Visit: Payer: BC Managed Care – PPO | Admitting: Nurse Practitioner

## 2023-09-25 ENCOUNTER — Encounter: Payer: Self-pay | Admitting: Nurse Practitioner

## 2023-09-26 ENCOUNTER — Ambulatory Visit: Payer: 59 | Admitting: Nurse Practitioner

## 2023-09-26 ENCOUNTER — Encounter: Payer: Self-pay | Admitting: Nurse Practitioner

## 2023-09-26 VITALS — BP 149/84 | HR 74 | Temp 97.8°F | Ht 69.0 in | Wt 208.0 lb

## 2023-09-26 DIAGNOSIS — I10 Essential (primary) hypertension: Secondary | ICD-10-CM

## 2023-09-26 DIAGNOSIS — Z6830 Body mass index (BMI) 30.0-30.9, adult: Secondary | ICD-10-CM

## 2023-09-26 DIAGNOSIS — E785 Hyperlipidemia, unspecified: Secondary | ICD-10-CM | POA: Diagnosis not present

## 2023-09-26 DIAGNOSIS — Z23 Encounter for immunization: Secondary | ICD-10-CM | POA: Diagnosis not present

## 2023-09-26 DIAGNOSIS — Z6829 Body mass index (BMI) 29.0-29.9, adult: Secondary | ICD-10-CM | POA: Insufficient documentation

## 2023-09-26 DIAGNOSIS — K219 Gastro-esophageal reflux disease without esophagitis: Secondary | ICD-10-CM

## 2023-09-26 DIAGNOSIS — R600 Localized edema: Secondary | ICD-10-CM

## 2023-09-26 DIAGNOSIS — Z86718 Personal history of other venous thrombosis and embolism: Secondary | ICD-10-CM

## 2023-09-26 LAB — LIPID PANEL

## 2023-09-26 MED ORDER — AMLODIPINE BESYLATE 5 MG PO TABS
5.0000 mg | ORAL_TABLET | Freq: Every day | ORAL | 1 refills | Status: DC
Start: 2023-09-26 — End: 2024-04-16

## 2023-09-26 MED ORDER — ROSUVASTATIN CALCIUM 10 MG PO TABS
10.0000 mg | ORAL_TABLET | Freq: Every day | ORAL | 1 refills | Status: DC
Start: 2023-09-26 — End: 2024-04-16

## 2023-09-26 MED ORDER — PANTOPRAZOLE SODIUM 40 MG PO TBEC
40.0000 mg | DELAYED_RELEASE_TABLET | Freq: Every day | ORAL | 1 refills | Status: DC
Start: 2023-09-26 — End: 2024-05-22

## 2023-09-26 MED ORDER — APIXABAN 5 MG PO TABS
5.0000 mg | ORAL_TABLET | Freq: Two times a day (BID) | ORAL | 5 refills | Status: DC
Start: 2023-09-26 — End: 2024-04-16

## 2023-09-26 NOTE — Progress Notes (Signed)
Subjective:    Patient ID: Christina [name], female    DOB: 1951/04/27, 73 y.o.   MRN: [id number]   Chief Complaint: medical management of chronic issues     HPI:  Christina [name] is a 73 y.o. who identifies as a female who was assigned female at birth.   Social history: Lives with: husband Work history: retired Clinical cytogeneticist.   Comes in today for follow up of the following chronic medical issues:  1. Primary hypertension No c/o chest pain, sob or headache. Does not check blood pressure at home. BP Readings from Last 3 Encounters:  04/11/23 (!) 142/85  03/28/23 (!) 151/89  03/22/23 (!) 144/86     2. Hyperlipidemia with target LDL less than 100 Does not watch diet and does no dedicated exercise.  Lab Results  Component Value Date   CHOL 249 (H) 03/08/2022   HDL 80 03/08/2022   LDLCALC 154 (H) 03/08/2022   TRIG 91 03/08/2022   CHOLHDL 3.1 03/08/2022  The 10-year ASCVD risk score (Arnett DK, et al., 2019) is: 24.5%    3. Gastroesophageal reflux disease, unspecified whether esophagitis present Is on protonix and is symptomatic if she does not take meds.  4. Peripheral edema Has daily by the end of the day.  5. BMI 29.0-29.9,adult No recent weight changes  Wt Readings from Last 3 Encounters:  09/26/23 208 lb (94.3 kg)  04/11/23 205 lb 14.4 oz (93.4 kg)  03/28/23 204 lb (92.5 kg)   BMI Readings from Last 3 Encounters:  09/26/23 30.72 kg/m  04/11/23 30.41 kg/m  03/28/23 30.13 kg/m      New complaints: None today  Allergies  Allergen Reactions   Ace Inhibitors Cough        Outpatient Encounter Medications as of 09/26/2023  Medication Sig   acetaminophen (TYLENOL) 325 MG tablet Take 2 tablets (650 mg total) by mouth every 6 (six) hours as needed for mild pain (or Fever >/= 101).   amLODipine (NORVASC) 5 MG tablet Take 1 tablet (5 mg total) by mouth daily.   apixaban (ELIQUIS) 5 MG TABS tablet Take 1 tablet (5 mg  total) by mouth 2 (two) times daily.   diclofenac sodium (VOLTAREN) 1 % GEL Apply 2 g topically 3 (three) times daily as needed (knee pain).   Multiple Vitamins-Minerals (MULTIVITAMIN GUMMIES WOMENS) CHEW Chew 1 each by mouth daily.   pantoprazole (PROTONIX) 40 MG tablet Take 1 tablet (40 mg total) by mouth daily.   rosuvastatin (CRESTOR) 10 MG tablet Take 1 tablet (10 mg total) by mouth daily.   No facility-administered encounter medications on file as of 09/26/2023.    Past Surgical History:  Procedure Laterality Date   COLONOSCOPY  2015   CYSTECTOMY     From shoulder and neck   KNEE ARTHROSCOPY Left    Dr Ranell Patrick   SKIN GRAFT     ulcer on right leg    TOTAL KNEE ARTHROPLASTY Right 08/23/2019   Procedure: TOTAL KNEE ARTHROPLASTY;  Surgeon: Beverely Low, MD;  Location: [facility];  Service: Orthopedics;  Laterality: Right;   TUBAL LIGATION     Multiple   UMBILICAL HERNIA REPAIR N/A 01/27/2020   Procedure: EPIGASTRIC HERNIA REPAIR WITH MESH;  Surgeon: Lucretia Roers, MD;  Location: [facility];  Service: General;  Laterality: N/A;  epicastric site    Family History  Problem Relation Age of Onset   Heart attack Mother    Deep vein thrombosis Mother  Diabetes Mother    Heart disease Mother    Hyperlipidemia Mother    Cancer Father    Hyperlipidemia Sister    Deep vein thrombosis Daughter    Other Neg Hx        Clotting disorder      Controlled substance contract: n/a     Review of Systems  Constitutional:  Negative for diaphoresis.  Eyes:  Negative for pain.  Respiratory:  Negative for shortness of breath.   Cardiovascular:  Negative for chest pain, palpitations and leg swelling.  Gastrointestinal:  Negative for abdominal pain.  Endocrine: Negative for polydipsia.  Skin:  Negative for rash.  Neurological:  Negative for dizziness, weakness and headaches.  Hematological:  Does not bruise/bleed easily.  All other systems reviewed and are negative.      Objective:    Physical Exam Vitals and nursing note reviewed.  Constitutional:      General: She is not in acute distress.    Appearance: Normal appearance. She is well-developed.  HENT:     Head: Normocephalic.     Right Ear: Tympanic membrane normal.     Left Ear: Tympanic membrane normal.     Nose: Nose normal.     Mouth/Throat:     Mouth: Mucous membranes are moist.  Eyes:     Pupils: Pupils are equal, round, and reactive to light.  Neck:     Vascular: No carotid bruit or JVD.  Cardiovascular:     Rate and Rhythm: Normal rate and regular rhythm.     Heart sounds: Normal heart sounds.  Pulmonary:     Effort: Pulmonary effort is normal. No respiratory distress.     Breath sounds: Normal breath sounds. No wheezing or rales.  Chest:     Chest wall: No tenderness.  Abdominal:     General: Bowel sounds are normal. There is no distension or abdominal bruit.     Palpations: Abdomen is soft. There is no hepatomegaly, splenomegaly, mass or pulsatile mass.     Tenderness: There is no abdominal tenderness.  Musculoskeletal:        General: Normal range of motion.     Cervical back: Normal range of motion and neck supple.     Right lower leg: No edema.     Left lower leg: No edema.     Comments: Compression hose on  Lymphadenopathy:     Cervical: No cervical adenopathy.  Skin:    General: Skin is warm and dry.  Neurological:     Mental Status: She is alert and oriented to person, place, and time.     Deep Tendon Reflexes: Reflexes are normal and symmetric.  Psychiatric:        Behavior: Behavior normal.        Thought Content: Thought content normal.        Judgment: Judgment normal.     BP (!) 149/84   Pulse 74   Temp 97.8 F (36.6 C) (Temporal)   Ht 5\' 9"  (1.753 m)   Wt 208 lb (94.3 kg)   LMP  (LMP Unknown)   SpO2 100%   BMI 30.72 kg/m         Assessment & Plan:  Christina [name] comes in today with chief complaint of medical management of chronic issues    Diagnosis  and orders addressed:  1. Primary hypertension Low sodium diet - amLODipine (NORVASC) 5 MG tablet; Take 1 tablet (5 mg total) by mouth daily. (NEEDS TO BE SEEN  BEFORE NEXT REFILL)  Dispense: 30 tablet; Refill: 0 - CBC with Differential/Platelet - CMP14+EGFR  2. Hyperlipidemia with target LDL less than 100 Low fat diet - rosuvastatin (CRESTOR) 10 MG tablet; Take 1 tablet (10 mg total) by mouth daily.  Dispense: 90 tablet; Refill: 3 - Lipid panel  3. Gastroesophageal reflux disease, unspecified whether esophagitis present Avoid spicy foods Do not eat 2 hours prior to bedtime  - pantoprazole (PROTONIX) 40 MG tablet; Take 1 tablet (40 mg total) by mouth daily.  Dispense: 90 tablet; Refill: 1  4. Peripheral edema Elevate legs when sitting  5. Venous stasis ulcer of right ankle limited to breakdown of skin without varicose veins (HCC) Keep follow up with vascular surgeon Make sure home health comes to reapply unna boot  6. BMI 29.0-29.9,adult Discussed diet and exercise for person with BMI >25 Will recheck weight in 3-6 months   7. History of DVT of lower extremity Report any bleeding issues - apixaban (ELIQUIS) 5 MG TABS tablet; Take 1 tablet (5 mg total) by mouth 2 (two) times daily.  Dispense: 60 tablet; Refill: 5   Labs pending Health Maintenance reviewed Diet and exercise encouraged  Follow up plan: 6 months   Mary-Margaret Daphine Deutscher, FNP

## 2023-09-26 NOTE — Addendum Note (Signed)
Addended by: Bennie Pierini on: 09/26/2023 03:43 PM   Modules accepted: Level of Service

## 2023-09-26 NOTE — Patient Instructions (Signed)
Deep Vein Thrombosis  Deep vein thrombosis (DVT) is a condition in which a blood clot forms in a vein of the deep venous system. This can occur in the lower leg, thigh, pelvis, arm, or neck. A clot is blood that has thickened into a gel or solid. This condition is serious and can be life-threatening if the clot travels to the arteries of the lungs and causes a blockage (pulmonary embolism). A DVT can also damage veins in the leg, which can lead to long-term venous disease, leg pain, swelling, discoloration, and ulcers or sores (post-thrombotic syndrome). What are the causes? This condition may be caused by: A slowdown of blood flow. Damage to a vein. A condition that causes blood to clot more easily, such as certain bleeding disorders. What increases the risk? The following factors may make you more likely to develop this condition: Obesity. Being older, especially older than age 15. Being inactive or not moving around (sedentary lifestyle). This may include: Sitting or lying down for longer than 4-6 hours other than to sleep at night. Being in the hospital, or having major or lengthy surgery. Having any recent bone injuries, such as breaks (fractures), that reduce movement, especially in the lower extremities. Having recent orthopedic surgery on the lower extremities. Being pregnant, giving birth, or having recently given birth. Taking medicines that contain estrogen, such as birth control or hormone replacement therapy. Using products that contain nicotine or tobacco, especially if you use hormonal birth control. Having a history of a blood vessel disease (peripheral vascular disease) or congestive heart disease. Having a history of cancer, especially if being treated with chemotherapy. What are the signs or symptoms? Symptoms of this condition include: Swelling, pain, pressure, or tenderness in an arm or a leg. An arm or a leg becoming warm, red, or discolored. A leg turning very pale or  blue. You may have a large DVT. This is rare. If the clot is in your leg, you may notice that symptoms get worse when you stand or walk. In some cases, there are no symptoms. How is this diagnosed? This condition is diagnosed with: Your medical history and a physical exam. Tests, such as: Blood tests to check how well your blood clots. Doppler ultrasound. This is the best way to find a DVT. CT venogram. Contrast dye is injected into a vein, and X-rays are taken to check for clots. This is helpful for veins in the chest or pelvis. How is this treated? Treatment for this condition depends on: The cause of your DVT. The size and location of your DVT, or having more than one DVT. Your risk for bleeding or developing more clots. Other medical conditions you may have. Treatment may include: Taking a blood thinner medicine (anticoagulant) to prevent more clots from forming or current clots from growing. Wearing compression stockings. Injecting medicines into the affected vein to break up the clot (catheter-directed thrombolysis). Surgical procedures, when DVT is severe or hard to treat. These may be done to: Isolate and remove your clot. Place an inferior vena cava (IVC) filter. This filter is placed into a large vein called the inferior vena cava to catch blood clots before they reach your lungs. You may get some medical treatments for 6 months or longer. Follow these instructions at home: If you are taking blood thinners: Talk with your health care provider before you take any medicines that contain aspirin or NSAIDs, such as ibuprofen. These medicines increase your risk for dangerous bleeding. Take your medicine exactly  as told, at the same time every day. Do not skip a dose. Do not take more than the prescribed dose. This is important. Ask your health care provider about foods and medicines that could change or interact with the way your blood thinner works. Avoid these foods and medicines  if you are told to do so. Avoid anything that may cause bleeding or bruising. You may bleed more easily while taking blood thinners. Be very careful when using knives, scissors, or other sharp objects. Use an electric razor instead of a blade. Avoid activities that could cause injury or bruising, and follow instructions for preventing falls. Tell your health care provider if you have had any internal bleeding, bleeding ulcers, or neurologic diseases, such as strokes or cerebral aneurysms. Wear a medical alert bracelet or carry a card that lists what medicines you take. General instructions Take over-the-counter and prescription medicines only as told by your health care provider. Return to your normal activities as told by your health care provider. Ask your health care provider what activities are safe for you. If recommended, wear compression stockings as told by your health care provider. These stockings help to prevent blood clots and reduce swelling in your legs. Never wear your compression stockings while sleeping at night. Keep all follow-up visits. This is important. Where to find more information American Heart Association: www.heart.org Centers for Disease Control and Prevention: FootballExhibition.com.br National Heart, Lung, and Blood Institute: PopSteam.is Contact a health care provider if: You miss a dose of your blood thinner. You have unusual bruising or other color changes. You have new or worse pain, swelling, or redness in an arm or a leg. You have worsening numbness or tingling in an arm or a leg. You have a significant color change (pale or blue) in the extremity that has the DVT. Get help right away if: You have signs or symptoms that a blood clot has moved to the lungs. These may include: Shortness of breath. Chest pain. Fast or irregular heartbeats (palpitations). Light-headedness, dizziness, or fainting. Coughing up blood. You have signs or symptoms that your blood is  too thin. These may include: Blood in your vomit, stool, or urine. A cut that will not stop bleeding. A menstrual period that is heavier than usual. A severe headache or confusion. These symptoms may be an emergency. Get help right away. Call 911. Do not wait to see if the symptoms will go away. Do not drive yourself to the hospital. Summary Deep vein thrombosis (DVT) happens when a blood clot forms in a deep vein. This may occur in the lower leg, thigh, pelvis, arm, or neck. Symptoms affect the arm or leg and can include swelling, pain, tenderness, warmth, redness, or discoloration. This condition may be treated with medicines. In severe cases, a procedure or surgery may be done to remove or dissolve the clots. If you are taking blood thinners, take them exactly as told. Do not skip a dose. Do not take more than is prescribed. Get help right away if you have a severe headache, shortness of breath, chest pain, fast or irregular heartbeats, or blood in your vomit, urine, or stool. This information is not intended to replace advice given to you by your health care provider. Make sure you discuss any questions you have with your health care provider. Document Revised: 02/22/2021 Document Reviewed: 02/22/2021 Elsevier Patient Education  2024 [employer].

## 2023-09-27 LAB — CBC WITH DIFFERENTIAL/PLATELET
Basophils Absolute: 0 10*3/uL (ref 0.0–0.2)
Basos: 0 %
EOS (ABSOLUTE): 0.1 10*3/uL (ref 0.0–0.4)
Eos: 2 %
Hematocrit: 40.7 % (ref 34.0–46.6)
Hemoglobin: 13.3 g/dL (ref 11.1–15.9)
Immature Grans (Abs): 0 10*3/uL (ref 0.0–0.1)
Immature Granulocytes: 0 %
Lymphocytes Absolute: 1.6 10*3/uL (ref 0.7–3.1)
Lymphs: 28 %
MCH: 25.4 pg — ABNORMAL LOW (ref 26.6–33.0)
MCHC: 32.7 g/dL (ref 31.5–35.7)
MCV: 78 fL — ABNORMAL LOW (ref 79–97)
Monocytes Absolute: 0.5 10*3/uL (ref 0.1–0.9)
Monocytes: 8 %
Neutrophils Absolute: 3.5 10*3/uL (ref 1.4–7.0)
Neutrophils: 62 %
Platelets: 273 10*3/uL (ref 150–450)
RBC: 5.24 x10E6/uL (ref 3.77–5.28)
RDW: 13.1 % (ref 11.7–15.4)
WBC: 5.7 10*3/uL (ref 3.4–10.8)

## 2023-09-27 LAB — CMP14+EGFR
ALT: 18 IU/L (ref 0–32)
AST: 27 IU/L (ref 0–40)
Albumin: 4.3 g/dL (ref 3.8–4.8)
Alkaline Phosphatase: 111 IU/L (ref 44–121)
BUN/Creatinine Ratio: 14 (ref 12–28)
BUN: 17 mg/dL (ref 8–27)
Bilirubin Total: 0.3 mg/dL (ref 0.0–1.2)
CO2: 24 mmol/L (ref 20–29)
Calcium: 9.9 mg/dL (ref 8.7–10.3)
Chloride: 107 mmol/L — ABNORMAL HIGH (ref 96–106)
Creatinine, Ser: 1.21 mg/dL — ABNORMAL HIGH (ref 0.57–1.00)
Globulin, Total: 2.6 g/dL (ref 1.5–4.5)
Glucose: 102 mg/dL — ABNORMAL HIGH (ref 70–99)
Potassium: 4.5 mmol/L (ref 3.5–5.2)
Sodium: 145 mmol/L — ABNORMAL HIGH (ref 134–144)
Total Protein: 6.9 g/dL (ref 6.0–8.5)
eGFR: 47 mL/min/{1.73_m2} — ABNORMAL LOW (ref >59)

## 2023-09-27 LAB — LIPID PANEL
Cholesterol, Total: 172 mg/dL (ref 100–199)
HDL: 76 mg/dL (ref >39)
LDL CALC COMMENT:: 2.3 ratio (ref 0.0–4.4)
LDL Chol Calc (NIH): 79 mg/dL (ref 0–99)
Triglycerides: 95 mg/dL (ref 0–149)
VLDL Cholesterol Cal: 17 mg/dL (ref 5–40)

## 2024-01-22 ENCOUNTER — Ambulatory Visit: Payer: Self-pay | Admitting: Nurse Practitioner

## 2024-01-22 NOTE — Telephone Encounter (Signed)
 FYI Only or Action Required?: FYI only for provider  Patient was last seen in primary care on 09/26/2023 by Delfina Feller, FNP. Called Nurse Triage reporting Leg Pain. Symptoms began several weeks ago. Interventions attempted: OTC medications: Voltaren  gel. Symptoms are: gradually improving.  Triage Disposition: See PCP Within 2 Weeks  Patient/caregiver understands and will follow disposition?: Yes                            Copied from CRM [id number]. Topic: Clinical - Red Word Triage >> Jan 22, 2024  9:38 AM Ivette P wrote: Red Word that prompted transfer to Nurse Triage: hurt leg, been 2 weeks. getting better. Hoping around. Pain putting vegera gel on it.    drove another bus Reason for Disposition  [1] MILD pain (e.g., does not interfere with normal activities) AND [2] present > 7 days  Answer Assessment - Initial Assessment Questions Left left pain x2 weeks From working, standing on it 1/10 pain level intermitent Hopping for two weeks, can walk better Tender on one side of knee and back of leg Was hurting but not hurting now, putting Voltaren  gel on it Patient wears compression stockings and wears a knee brace Denies numbness, difficulty breathing chest, pain, warm to touch  Protocols used: Leg Pain-A-AH

## 2024-01-22 NOTE — Telephone Encounter (Signed)
 Appointment made for tomorrow

## 2024-01-23 ENCOUNTER — Encounter: Payer: Self-pay | Admitting: Nurse Practitioner

## 2024-01-23 ENCOUNTER — Ambulatory Visit: Admitting: Nurse Practitioner

## 2024-01-23 VITALS — BP 142/82 | HR 75 | Temp 97.5°F | Ht 69.0 in | Wt 201.0 lb

## 2024-01-23 DIAGNOSIS — M25562 Pain in left knee: Secondary | ICD-10-CM | POA: Diagnosis not present

## 2024-01-23 MED ORDER — MELOXICAM 15 MG PO TABS
15.0000 mg | ORAL_TABLET | Freq: Every day | ORAL | 0 refills | Status: DC
Start: 2024-01-23 — End: 2024-02-19

## 2024-01-23 NOTE — Progress Notes (Signed)
   Subjective:    Patient ID: Leobardo Rainier, female    DOB: 06/12/1951, 73 y.o.   MRN: [id number]  Knee Pain  There was no injury mechanism. The quality of the pain is described as aching. The pain is at a severity of 0/10. The pain is mild. The pain has been Fluctuating since onset. Pertinent negatives include no muscle weakness, numbness or tingling. She reports no foreign bodies present. The symptoms are aggravated by movement and weight bearing. She has tried ice and acetaminophen  for the symptoms. The treatment provided mild relief.      Review of Systems  Constitutional:  Negative for diaphoresis.  Eyes:  Negative for pain.  Respiratory:  Negative for shortness of breath.   Cardiovascular:  Negative for chest pain, palpitations and leg swelling.  Gastrointestinal:  Negative for abdominal pain.  Endocrine: Negative for polydipsia.  Musculoskeletal:  Positive for arthralgias (left knee).  Skin:  Negative for rash.  Neurological:  Negative for dizziness, tingling, weakness, numbness and headaches.  Hematological:  Does not bruise/bleed easily.  All other systems reviewed and are negative.      Objective:   Physical Exam Constitutional:      Appearance: Normal appearance. She is obese.  Cardiovascular:     Rate and Rhythm: Normal rate and regular rhythm.     Heart sounds: Normal heart sounds.  Pulmonary:     Breath sounds: Normal breath sounds.  Musculoskeletal:     Comments: Mild left knee effusion FROM with crepitus on flexion and extension All ligaments intact  Skin:    General: Skin is warm.  Neurological:     General: No focal deficit present.     Mental Status: She is alert and oriented to person, place, and time.  Psychiatric:        Mood and Affect: Mood normal.        Behavior: Behavior normal.     BP (!) 142/82   Pulse 75   Temp (!) 97.5 F (36.4 C) (Temporal)   Ht 5\' 9"  (1.753 m)   Wt 201 lb (91.2 kg)   LMP  (LMP Unknown)   SpO2 98%   BMI  29.68 kg/m        Assessment & Plan:   [place] [facility] in today with chief complaint of Knee Pain (Left knee)   1. Acute pain of left knee (Primary) Ice Elevate Rest Wear elastic wrap when up on leg - meloxicam  (MOBIC ) 15 MG tablet; Take 1 tablet (15 mg total) by mouth daily.  Dispense: 30 tablet; Refill: 0    The above assessment and management plan was discussed with the patient. The patient verbalized understanding of and has agreed to the management plan. Patient is aware to call the clinic if symptoms persist or worsen. Patient is aware when to return to the clinic for a follow-up visit. Patient educated on when it is appropriate to go to the emergency department.   Mary-Margaret Gaylyn Keas, FNP

## 2024-01-23 NOTE — Patient Instructions (Signed)
 Acute Knee Pain, Adult Many things can cause knee pain. Sometimes, knee pain is sudden (acute). It may be caused by damage, swelling, or irritation of the muscles and tissues that support your knee. Pain may come from: A fall. An injury to the knee from twisting motions. A hit to the knee. Infection. The pain often goes away on its own with time and rest. If the pain does not go away, tests may be done to find out what is causing the pain. These may include: Imaging tests, such as an X-ray, MRI, CT scan, or ultrasound. Joint aspiration. In this test, fluid is removed from the knee and checked. Arthroscopy. In this test, a lighted tube is put in the knee and an image is shown on a screen. A biopsy. In this test, a health care provider will remove a small piece of tissue for testing. Follow these instructions at home: If you have a knee sleeve or brace that can be taken off:  Wear the knee sleeve or brace as told by your provider. Take it off only if your provider says that you can. Check the skin around it every day. Tell your provider if you see problems. Loosen the knee sleeve or brace if your toes tingle, are numb, or turn cold and blue. Keep the knee sleeve or brace clean and dry. Bathing If the knee sleeve or brace is not waterproof: Do not let it get wet. Cover it when you take a bath or shower. Use a cover that does not let any water in. Managing pain, stiffness, and swelling  If told, put ice on the area. If you have a knee sleeve or brace that you can take off, remove it as told. Put ice in a plastic bag. Place a towel between your skin and the bag. Leave the ice on for 20 minutes, 2-3 times a day. If your skin turns bright red, take off the ice right away to prevent skin damage. The risk of damage is higher if you cannot feel pain, heat, or cold. Move your toes often to reduce stiffness and swelling. Raise the injured area above the level of your heart while you are sitting  or lying down. Use a pillow to support your foot as needed. If told, use an elastic bandage to put pressure (compression) on your injured knee. This may control swelling, give support, and help with discomfort. Sleep with a pillow under your knee. Activity Rest your knee. Do not do things that cause pain or make pain worse. Do not stand or walk on your injured knee until you're told it's okay. Use crutches as told. Avoid activities where both feet leave the ground at the same time and put stress on the joints. Avoid running, jumping rope, and doing jumping jacks. Work with a physical therapist to make a safe exercise program if told. Physical therapy helps your knee move better and get stronger. Exercise as told. General instructions Take your medicines only as told by your provider. If you are overweight, work with your provider and an expert in healthy eating, called a dietician, to set goals to lose weight. Being overweight can make your knee hurt more. Do not smoke, vape, or use products with nicotine or tobacco in them. If you need help quitting, talk with your provider. Return to normal activities when you are told. Ask what things are safe for you to do. Watch for any changes in your symptoms. Keep all follow-up visits. Your provider will check  your healing and adjust treatments if needed. Contact a health care provider if: The knee pain does not stop. The knee pain changes or gets worse. You have a fever along with knee pain. Your knee is red or feels warm when you touch it. Your knee gives out or locks up. Get help right away if: Your knee swells and the swelling gets worse. You cannot move your knee. You have very bad knee pain that does not get better with medicine. This information is not intended to replace advice given to you by your health care provider. Make sure you discuss any questions you have with your health care provider. Document Revised: 05/04/2023 Document  Reviewed: 09/26/2022 Elsevier Patient Education  2024 [employer].

## 2024-02-19 ENCOUNTER — Other Ambulatory Visit: Payer: Self-pay | Admitting: Nurse Practitioner

## 2024-02-19 DIAGNOSIS — M25562 Pain in left knee: Secondary | ICD-10-CM

## 2024-03-20 ENCOUNTER — Other Ambulatory Visit: Payer: Self-pay | Admitting: Nurse Practitioner

## 2024-03-20 DIAGNOSIS — M25562 Pain in left knee: Secondary | ICD-10-CM

## 2024-03-26 ENCOUNTER — Ambulatory Visit: Payer: 59 | Admitting: Nurse Practitioner

## 2024-04-15 ENCOUNTER — Other Ambulatory Visit: Payer: Self-pay | Admitting: Nurse Practitioner

## 2024-04-15 DIAGNOSIS — I10 Essential (primary) hypertension: Secondary | ICD-10-CM

## 2024-04-16 ENCOUNTER — Ambulatory Visit: Admitting: Nurse Practitioner

## 2024-04-16 ENCOUNTER — Encounter: Payer: Self-pay | Admitting: Nurse Practitioner

## 2024-04-16 VITALS — BP 135/80 | HR 81 | Temp 98.0°F | Ht 69.0 in | Wt 204.0 lb

## 2024-04-16 DIAGNOSIS — K219 Gastro-esophageal reflux disease without esophagitis: Secondary | ICD-10-CM | POA: Diagnosis not present

## 2024-04-16 DIAGNOSIS — Z0000 Encounter for general adult medical examination without abnormal findings: Secondary | ICD-10-CM

## 2024-04-16 DIAGNOSIS — I10 Essential (primary) hypertension: Secondary | ICD-10-CM

## 2024-04-16 DIAGNOSIS — R600 Localized edema: Secondary | ICD-10-CM

## 2024-04-16 DIAGNOSIS — Z0001 Encounter for general adult medical examination with abnormal findings: Secondary | ICD-10-CM

## 2024-04-16 DIAGNOSIS — Z6829 Body mass index (BMI) 29.0-29.9, adult: Secondary | ICD-10-CM

## 2024-04-16 DIAGNOSIS — E785 Hyperlipidemia, unspecified: Secondary | ICD-10-CM | POA: Diagnosis not present

## 2024-04-16 DIAGNOSIS — Z23 Encounter for immunization: Secondary | ICD-10-CM

## 2024-04-16 DIAGNOSIS — Z86718 Personal history of other venous thrombosis and embolism: Secondary | ICD-10-CM

## 2024-04-16 MED ORDER — ROSUVASTATIN CALCIUM 10 MG PO TABS: 10.0000 mg | ORAL_TABLET | Freq: Every day | ORAL | 1 refills | Status: AC

## 2024-04-16 MED ORDER — AMLODIPINE BESYLATE 5 MG PO TABS: 5.0000 mg | ORAL_TABLET | Freq: Every day | ORAL | 1 refills | Status: AC

## 2024-04-16 MED ORDER — APIXABAN 5 MG PO TABS: 5.0000 mg | ORAL_TABLET | Freq: Two times a day (BID) | ORAL | 5 refills | Status: AC

## 2024-04-16 NOTE — Patient Instructions (Signed)
 Fall Prevention in the Home, Adult Falls can cause injuries and can happen to people of all ages. There are many things you can do to make your home safer and to help prevent falls. What actions can I take to prevent falls? General information Use good lighting in all rooms. Make sure to: Replace any light bulbs that burn out. Turn on the lights in dark areas and use night-lights. Keep items that you use often in easy-to-reach places. Lower the shelves around your home if needed. Move furniture so that there are clear paths around it. Do not use throw rugs or other things on the floor that can make you trip. If any of your floors are uneven, fix them. Add color or contrast paint or tape to clearly mark and help you see: Grab bars or handrails. First and last steps of staircases. Where the edge of each step is. If you use a ladder or stepladder: Make sure that it is fully opened. Do not climb a closed ladder. Make sure the sides of the ladder are locked in place. Have someone hold the ladder while you use it. Know where your pets are as you move through your home. What can I do in the bathroom?     Keep the floor dry. Clean up any water on the floor right away. Remove soap buildup in the bathtub or shower. Buildup makes bathtubs and showers slippery. Use non-skid mats or decals on the floor of the bathtub or shower. Attach bath mats securely with double-sided, non-slip rug tape. If you need to sit down in the shower, use a non-slip stool. Install grab bars by the toilet and in the bathtub and shower. Do not use towel bars as grab bars. What can I do in the bedroom? Make sure that you have a light by your bed that is easy to reach. Do not use any sheets or blankets on your bed that hang to the floor. Have a firm chair or bench with side arms that you can use for support when you get dressed. What can I do in the kitchen? Clean up any spills right away. If you need to reach something  above you, use a step stool with a grab bar. Keep electrical cords out of the way. Do not use floor polish or wax that makes floors slippery. What can I do with my stairs? Do not leave anything on the stairs. Make sure that you have a light switch at the top and the bottom of the stairs. Make sure that there are handrails on both sides of the stairs. Fix handrails that are broken or loose. Install non-slip stair treads on all your stairs if they do not have carpet. Avoid having throw rugs at the top or bottom of the stairs. Choose a carpet that does not hide the edge of the steps on the stairs. Make sure that the carpet is firmly attached to the stairs. Fix carpet that is loose or worn. What can I do on the outside of my home? Use bright outdoor lighting. Fix the edges of walkways and driveways and fix any cracks. Clear paths of anything that can make you trip, such as tools or rocks. Add color or contrast paint or tape to clearly mark and help you see anything that might make you trip as you walk through a door, such as a raised step or threshold. Trim any bushes or trees on paths to your home. Check to see if handrails are loose  or broken and that both sides of all steps have handrails. Install guardrails along the edges of any raised decks and porches. Have leaves, snow, or ice cleared regularly. Use sand, salt, or ice melter on paths if you live where there is ice and snow during the winter. Clean up any spills in your garage right away. This includes grease or oil spills. What other actions can I take? Review your medicines with your doctor. Some medicines can cause dizziness or changes in blood pressure, which increase your risk of falling. Wear shoes that: Have a low heel. Do not wear high heels. Have rubber bottoms and are closed at the toe. Feel good on your feet and fit well. Use tools that help you move around if needed. These include: Canes. Walkers. Scooters. Crutches. Ask  your doctor what else you can do to help prevent falls. This may include seeing a physical therapist to learn to do exercises to move better and get stronger. Where to find more information Centers for Disease Control and Prevention, [employer]: TonerPromos.no [employer] on Aging: BaseRingTones.pl National Institute on Aging: BaseRingTones.pl Contact a doctor if: You are afraid of falling at home. You feel weak, drowsy, or dizzy at home. You fall at home. Get help right away if you: Lose consciousness or have trouble moving after a fall. Have a fall that causes a head injury. These symptoms may be an emergency. Get help right away. Call 911. Do not wait to see if the symptoms will go away. Do not drive yourself to the hospital. This information is not intended to replace advice given to you by your health care provider. Make sure you discuss any questions you have with your health care provider. Document Revised: 04/04/2022 Document Reviewed: 04/04/2022 Elsevier Patient Education  2024 [employer].

## 2024-04-16 NOTE — Progress Notes (Signed)
 Subjective:    Patient ID: Christina [name], female    DOB: 08-20-1950, 73 y.o.   MRN: [id number]   Chief Complaint: annual physical    HPI:  Christina [name] is a 73 y.o. who identifies as a female who was assigned female at birth.   Social history: Lives with: husband Work history: retired Clinical cytogeneticist.   Comes in today for follow up of the following chronic medical issues:  1. Primary hypertension No c/o chest pain, sob or headache. Does not check blood pressure at home. BP Readings from Last 3 Encounters:  01/23/24 (!) 142/82  09/26/23 (!) 149/84  04/11/23 (!) 142/85     2. Hyperlipidemia with target LDL less than 100 Does not watch diet and does no dedicated exercise.  Lab Results  Component Value Date   CHOL 172 09/26/2023   HDL 76 09/26/2023   LDLCALC 79 09/26/2023   TRIG 95 09/26/2023   CHOLHDL 2.3 09/26/2023  The 10-year ASCVD risk score (Arnett DK, et al., 2019) is: 18.2%    3. Gastroesophageal reflux disease, unspecified whether esophagitis present Is on protonix  and is symptomatic if she does not take meds.  4. Peripheral edema Has daily by the end of the day.  5. BMI 29.0-29.9,adult Weight is up 3 lbs  Wt Readings from Last 3 Encounters:  04/16/24 204 lb (92.5 kg)  01/23/24 201 lb (91.2 kg)  09/26/23 208 lb (94.3 kg)   BMI Readings from Last 3 Encounters:  04/16/24 30.13 kg/m  01/23/24 29.68 kg/m  09/26/23 30.72 kg/m       New complaints: None today  Allergies  Allergen Reactions   Ace Inhibitors Cough        Outpatient Encounter Medications as of 04/16/2024  Medication Sig   acetaminophen  (TYLENOL ) 325 MG tablet Take 2 tablets (650 mg total) by mouth every 6 (six) hours as needed for mild pain (or Fever >/= 101).   amLODipine  (NORVASC ) 5 MG tablet TAKE 1 TABLET (5 MG TOTAL) BY MOUTH DAILY.   apixaban  (ELIQUIS ) 5 MG TABS tablet Take 1 tablet (5 mg total) by mouth 2 (two) times daily.    meloxicam  (MOBIC ) 15 MG tablet TAKE 1 TABLET (15 MG TOTAL) BY MOUTH DAILY.   Multiple Vitamins-Minerals (MULTIVITAMIN GUMMIES WOMENS) CHEW Chew 1 each by mouth daily.   pantoprazole  (PROTONIX ) 40 MG tablet Take 1 tablet (40 mg total) by mouth daily.   rosuvastatin  (CRESTOR ) 10 MG tablet Take 1 tablet (10 mg total) by mouth daily.   [DISCONTINUED] amLODipine  (NORVASC ) 5 MG tablet Take 1 tablet (5 mg total) by mouth daily.   No facility-administered encounter medications on file as of 04/16/2024.    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   Procedure: TOTAL KNEE ARTHROPLASTY;  Surgeon: Kay Kemps, MD;  Location: [facility];  Service: Orthopedics;  Laterality: Right;   TUBAL LIGATION     Multiple   UMBILICAL HERNIA REPAIR N/A 01/27/2020   Procedure: EPIGASTRIC HERNIA REPAIR WITH MESH;  Surgeon: Kallie Manuelita BROCKS, MD;  Location: [facility];  Service: General;  Laterality: N/A;  epicastric site    Family History  Problem Relation Age of Onset   Heart attack Mother  Deep vein thrombosis Mother    Diabetes Mother    Heart disease Mother    Hyperlipidemia Mother    Cancer Father    Hyperlipidemia Sister    Deep vein thrombosis Daughter    Other Neg Hx        Clotting disorder      Controlled substance contract: n/a     Review of Systems  Constitutional:  Negative for diaphoresis.  Eyes:  Negative for pain.  Respiratory:  Negative for shortness of breath.   Cardiovascular:  Negative for chest pain, palpitations and leg swelling.  Gastrointestinal:  Negative for abdominal pain.  Endocrine: Negative for polydipsia.  Skin:  Negative for rash.  Neurological:  Negative for dizziness, weakness and headaches.  Hematological:  Does not bruise/bleed easily.  All other systems reviewed and are  negative.      Objective:   Physical Exam Vitals and nursing note reviewed.  Constitutional:      General: She is not in acute distress.    Appearance: Normal appearance. She is well-developed.  HENT:     Head: Normocephalic.     Right Ear: Tympanic membrane normal.     Left Ear: Tympanic membrane normal.     Nose: Nose normal.     Mouth/Throat:     Mouth: Mucous membranes are moist.  Eyes:     Pupils: Pupils are equal, round, and reactive to light.  Neck:     Vascular: No carotid bruit or JVD.  Cardiovascular:     Rate and Rhythm: Normal rate and regular rhythm.     Heart sounds: Normal heart sounds.  Pulmonary:     Effort: Pulmonary effort is normal. No respiratory distress.     Breath sounds: Normal breath sounds. No wheezing or rales.  Chest:     Chest wall: No tenderness.  Abdominal:     General: Bowel sounds are normal. There is no distension or abdominal bruit.     Palpations: Abdomen is soft. There is no hepatomegaly, splenomegaly, mass or pulsatile mass.     Tenderness: There is no abdominal tenderness.  Musculoskeletal:        General: Normal range of motion.     Cervical back: Normal range of motion and neck supple.     Right lower leg: No edema.     Left lower leg: No edema.     Comments: Compression hose on  Lymphadenopathy:     Cervical: No cervical adenopathy.  Skin:    General: Skin is warm and dry.  Neurological:     Mental Status: She is alert and oriented to person, place, and time.     Deep Tendon Reflexes: Reflexes are normal and symmetric.  Psychiatric:        Behavior: Behavior normal.        Thought Content: Thought content normal.        Judgment: Judgment normal.   BP 135/80   Pulse 81   Temp 98 F (36.7 C) (Temporal)   Ht 5' 9 (1.753 m)   Wt 204 lb (92.5 kg)   LMP  (LMP Unknown)   SpO2 96%   BMI 30.13 kg/m    LMP  (LMP Unknown)         Assessment & Plan:  Christina [name] comes in today with chief complaint of  annual physical    Diagnosis and orders addressed:  1. Primary hypertension Low sodium diet - amLODipine  (NORVASC ) 5 MG tablet; Take 1 tablet (5 mg  total) by mouth daily. (NEEDS TO BE SEEN BEFORE NEXT REFILL)  Dispense: 30 tablet; Refill: 0 - CBC with Differential/Platelet - CMP14+EGFR  2. Hyperlipidemia with target LDL less than 100 Low fat diet - rosuvastatin  (CRESTOR ) 10 MG tablet; Take 1 tablet (10 mg total) by mouth daily.  Dispense: 90 tablet; Refill: 3 - Lipid panel  3. Gastroesophageal reflux disease, unspecified whether esophagitis present Avoid spicy foods Do not eat 2 hours prior to bedtime  - pantoprazole  (PROTONIX ) 40 MG tablet; Take 1 tablet (40 mg total) by mouth daily.  Dispense: 90 tablet; Refill: 1  4. Peripheral edema Elevate legs when sitting   6. BMI 29.0-29.9,adult Discussed diet and exercise for person with BMI >25 Will recheck weight in 3-6 months   7. History of DVT of lower extremity Report any bleeding issues - apixaban  (ELIQUIS ) 5 MG TABS tablet; Take 1 tablet (5 mg total) by mouth 2 (two) times daily.  Dispense: 60 tablet; Refill: 5   Labs pending Health Maintenance reviewed Diet and exercise encouraged  Follow up plan: 6 months   Mary-Margaret Gladis, FNP

## 2024-04-18 ENCOUNTER — Other Ambulatory Visit: Payer: Self-pay | Admitting: Nurse Practitioner

## 2024-04-18 DIAGNOSIS — M25562 Pain in left knee: Secondary | ICD-10-CM

## 2024-04-24 LAB — COLOGUARD

## 2024-05-21 LAB — COLOGUARD

## 2024-05-22 ENCOUNTER — Other Ambulatory Visit: Payer: Self-pay | Admitting: Nurse Practitioner

## 2024-05-22 DIAGNOSIS — K219 Gastro-esophageal reflux disease without esophagitis: Secondary | ICD-10-CM

## 2024-05-29 ENCOUNTER — Encounter (INDEPENDENT_AMBULATORY_CARE_PROVIDER_SITE_OTHER): Payer: Self-pay | Admitting: Gastroenterology

## 2024-06-09 LAB — COLOGUARD: COLOGUARD: NEGATIVE

## 2024-06-20 ENCOUNTER — Other Ambulatory Visit: Payer: Self-pay | Admitting: *Deleted

## 2024-06-20 DIAGNOSIS — M25562 Pain in left knee: Secondary | ICD-10-CM

## 2024-07-19 ENCOUNTER — Other Ambulatory Visit: Payer: Self-pay | Admitting: Nurse Practitioner

## 2024-07-19 DIAGNOSIS — M25562 Pain in left knee: Secondary | ICD-10-CM

## 2024-08-14 ENCOUNTER — Other Ambulatory Visit: Payer: Self-pay | Admitting: Nurse Practitioner

## 2024-08-14 DIAGNOSIS — M25562 Pain in left knee: Secondary | ICD-10-CM

## 2024-09-12 ENCOUNTER — Other Ambulatory Visit: Payer: Self-pay | Admitting: Nurse Practitioner

## 2024-09-12 DIAGNOSIS — M25562 Pain in left knee: Secondary | ICD-10-CM

## 2024-10-11 ENCOUNTER — Ambulatory Visit: Payer: Self-pay | Admitting: Nurse Practitioner
